# Patient Record
Sex: Female | Born: 1940
Health system: Southern US, Community
[De-identification: ages and names within clinical notes are randomized; demographics above are authoritative.]

## PROBLEM LIST (undated history)

## (undated) DIAGNOSIS — Z973 Presence of spectacles and contact lenses: Secondary | ICD-10-CM

## (undated) DIAGNOSIS — T8092XA Unspecified transfusion reaction, initial encounter: Secondary | ICD-10-CM

## (undated) DIAGNOSIS — R7989 Other specified abnormal findings of blood chemistry: Secondary | ICD-10-CM

## (undated) DIAGNOSIS — G43909 Migraine, unspecified, not intractable, without status migrainosus: Secondary | ICD-10-CM

## (undated) DIAGNOSIS — M1990 Unspecified osteoarthritis, unspecified site: Secondary | ICD-10-CM

## (undated) DIAGNOSIS — K635 Polyp of colon: Secondary | ICD-10-CM

## (undated) DIAGNOSIS — K760 Fatty (change of) liver, not elsewhere classified: Secondary | ICD-10-CM

## (undated) DIAGNOSIS — Z902 Acquired absence of lung [part of]: Secondary | ICD-10-CM

## (undated) DIAGNOSIS — D369 Benign neoplasm, unspecified site: Secondary | ICD-10-CM

## (undated) DIAGNOSIS — Z8669 Personal history of other diseases of the nervous system and sense organs: Secondary | ICD-10-CM

## (undated) DIAGNOSIS — L309 Dermatitis, unspecified: Secondary | ICD-10-CM

## (undated) DIAGNOSIS — R945 Abnormal results of liver function studies: Secondary | ICD-10-CM

## (undated) DIAGNOSIS — N329 Bladder disorder, unspecified: Secondary | ICD-10-CM

## (undated) DIAGNOSIS — L900 Lichen sclerosus et atrophicus: Secondary | ICD-10-CM

## (undated) DIAGNOSIS — K5730 Diverticulosis of large intestine without perforation or abscess without bleeding: Secondary | ICD-10-CM

## (undated) DIAGNOSIS — K219 Gastro-esophageal reflux disease without esophagitis: Secondary | ICD-10-CM

## (undated) DIAGNOSIS — D414 Neoplasm of uncertain behavior of bladder: Secondary | ICD-10-CM

## (undated) DIAGNOSIS — E785 Hyperlipidemia, unspecified: Secondary | ICD-10-CM

## (undated) DIAGNOSIS — I10 Essential (primary) hypertension: Secondary | ICD-10-CM

## (undated) DIAGNOSIS — N390 Urinary tract infection, site not specified: Secondary | ICD-10-CM

## (undated) DIAGNOSIS — M2040 Other hammer toe(s) (acquired), unspecified foot: Secondary | ICD-10-CM

## (undated) DIAGNOSIS — N289 Disorder of kidney and ureter, unspecified: Secondary | ICD-10-CM

## (undated) DIAGNOSIS — C801 Malignant (primary) neoplasm, unspecified: Secondary | ICD-10-CM

## (undated) DIAGNOSIS — Z5189 Encounter for other specified aftercare: Secondary | ICD-10-CM

## (undated) DIAGNOSIS — R0602 Shortness of breath: Secondary | ICD-10-CM

## (undated) HISTORY — DX: Lichen sclerosus et atrophicus: L90.0

## (undated) HISTORY — DX: Gastro-esophageal reflux disease without esophagitis: K21.9

## (undated) HISTORY — PX: VAGINAL HYSTERECTOMY: SUR661

## (undated) HISTORY — DX: Migraine, unspecified, not intractable, without status migrainosus: G43.909

## (undated) HISTORY — PX: OTHER SURGICAL HISTORY: SHX169

## (undated) HISTORY — DX: Unspecified transfusion reaction, initial encounter: T80.92XA

## (undated) HISTORY — DX: Disorder of kidney and ureter, unspecified: N28.9

## (undated) HISTORY — DX: Diverticulosis of large intestine without perforation or abscess without bleeding: K57.30

## (undated) HISTORY — DX: Neoplasm of uncertain behavior of bladder: D41.4

## (undated) HISTORY — PX: APPENDECTOMY: SHX54

## (undated) HISTORY — PX: BREAST BIOPSY: SHX20

## (undated) HISTORY — DX: Unspecified osteoarthritis, unspecified site: M19.90

## (undated) HISTORY — DX: Abnormal results of liver function studies: R94.5

## (undated) HISTORY — DX: Polyp of colon: K63.5

## (undated) HISTORY — DX: Benign neoplasm, unspecified site: D36.9

## (undated) HISTORY — DX: Other hammer toe(s) (acquired), unspecified foot: M20.40

## (undated) HISTORY — DX: Other specified abnormal findings of blood chemistry: R79.89

## (undated) HISTORY — DX: Fatty (change of) liver, not elsewhere classified: K76.0

## (undated) HISTORY — DX: Acquired absence of lung (part of): Z90.2

## (undated) HISTORY — DX: Dermatitis, unspecified: L30.9

---

## 1967-01-24 DIAGNOSIS — Z5189 Encounter for other specified aftercare: Secondary | ICD-10-CM

## 1967-01-24 DIAGNOSIS — IMO0001 Reserved for inherently not codable concepts without codable children: Secondary | ICD-10-CM

## 1967-01-24 HISTORY — PX: OTHER SURGICAL HISTORY: SHX169

## 1967-01-24 HISTORY — DX: Encounter for other specified aftercare: Z51.89

## 1967-01-24 HISTORY — DX: Reserved for inherently not codable concepts without codable children: IMO0001

## 1972-01-24 HISTORY — PX: VAGINAL HYSTERECTOMY: SUR661

## 1998-06-01 ENCOUNTER — Encounter: Payer: Self-pay | Admitting: Internal Medicine

## 1998-06-11 ENCOUNTER — Encounter: Payer: Self-pay | Admitting: Internal Medicine

## 1999-05-31 ENCOUNTER — Other Ambulatory Visit: Admission: RE | Admit: 1999-05-31 | Discharge: 1999-05-31 | Payer: Self-pay | Admitting: Obstetrics and Gynecology

## 1999-06-01 ENCOUNTER — Encounter: Admission: RE | Admit: 1999-06-01 | Discharge: 1999-06-01 | Payer: Self-pay | Admitting: Family Medicine

## 1999-06-01 ENCOUNTER — Encounter: Payer: Self-pay | Admitting: Family Medicine

## 2000-06-13 ENCOUNTER — Encounter: Admission: RE | Admit: 2000-06-13 | Discharge: 2000-09-11 | Payer: Self-pay | Admitting: Orthopedic Surgery

## 2000-10-10 ENCOUNTER — Encounter: Admission: RE | Admit: 2000-10-10 | Discharge: 2000-10-10 | Payer: Self-pay | Admitting: Obstetrics and Gynecology

## 2000-10-10 ENCOUNTER — Encounter: Payer: Self-pay | Admitting: Obstetrics and Gynecology

## 2000-12-25 ENCOUNTER — Other Ambulatory Visit: Admission: RE | Admit: 2000-12-25 | Discharge: 2000-12-25 | Payer: Self-pay | Admitting: Obstetrics and Gynecology

## 2001-11-20 ENCOUNTER — Encounter: Admission: RE | Admit: 2001-11-20 | Discharge: 2001-11-20 | Payer: Self-pay | Admitting: Urology

## 2001-11-20 ENCOUNTER — Encounter: Payer: Self-pay | Admitting: Urology

## 2001-11-20 ENCOUNTER — Encounter: Payer: Self-pay | Admitting: Internal Medicine

## 2001-11-21 ENCOUNTER — Encounter (INDEPENDENT_AMBULATORY_CARE_PROVIDER_SITE_OTHER): Payer: Self-pay | Admitting: *Deleted

## 2001-11-21 ENCOUNTER — Ambulatory Visit (HOSPITAL_BASED_OUTPATIENT_CLINIC_OR_DEPARTMENT_OTHER): Admission: RE | Admit: 2001-11-21 | Discharge: 2001-11-21 | Payer: Self-pay | Admitting: Urology

## 2002-03-25 ENCOUNTER — Encounter: Admission: RE | Admit: 2002-03-25 | Discharge: 2002-03-25 | Payer: Self-pay | Admitting: Obstetrics and Gynecology

## 2002-03-25 ENCOUNTER — Encounter: Payer: Self-pay | Admitting: Obstetrics and Gynecology

## 2002-06-09 ENCOUNTER — Other Ambulatory Visit: Admission: RE | Admit: 2002-06-09 | Discharge: 2002-06-09 | Payer: Self-pay | Admitting: Obstetrics and Gynecology

## 2002-09-09 ENCOUNTER — Encounter: Admission: RE | Admit: 2002-09-09 | Discharge: 2002-12-03 | Payer: Self-pay | Admitting: Orthopedic Surgery

## 2003-08-10 ENCOUNTER — Other Ambulatory Visit: Admission: RE | Admit: 2003-08-10 | Discharge: 2003-08-10 | Payer: Self-pay | Admitting: Obstetrics and Gynecology

## 2003-12-25 ENCOUNTER — Encounter: Admission: RE | Admit: 2003-12-25 | Discharge: 2003-12-25 | Payer: Self-pay | Admitting: Obstetrics and Gynecology

## 2004-01-19 ENCOUNTER — Encounter: Admission: RE | Admit: 2004-01-19 | Discharge: 2004-03-02 | Payer: Self-pay | Admitting: Orthopedic Surgery

## 2004-07-29 ENCOUNTER — Ambulatory Visit: Payer: Self-pay | Admitting: Internal Medicine

## 2004-08-05 ENCOUNTER — Ambulatory Visit: Payer: Self-pay | Admitting: Internal Medicine

## 2004-08-30 ENCOUNTER — Other Ambulatory Visit: Admission: RE | Admit: 2004-08-30 | Discharge: 2004-08-30 | Payer: Self-pay | Admitting: Obstetrics and Gynecology

## 2004-12-20 ENCOUNTER — Ambulatory Visit: Payer: Self-pay | Admitting: Internal Medicine

## 2005-02-14 ENCOUNTER — Encounter: Admission: RE | Admit: 2005-02-14 | Discharge: 2005-02-14 | Payer: Self-pay | Admitting: Obstetrics and Gynecology

## 2006-02-27 ENCOUNTER — Ambulatory Visit: Payer: Self-pay | Admitting: Internal Medicine

## 2006-02-27 LAB — CONVERTED CEMR LAB
Basophils Absolute: 0.1 10*3/uL (ref 0.0–0.1)
Basophils Relative: 0.8 % (ref 0.0–1.0)
CO2: 32 meq/L (ref 19–32)
Calcium: 9.8 mg/dL (ref 8.4–10.5)
Chloride: 105 meq/L (ref 96–112)
Cholesterol: 171 mg/dL (ref 0–200)
Creatinine, Ser: 0.6 mg/dL (ref 0.4–1.2)
GFR calc Af Amer: 129 mL/min
Glucose, Bld: 103 mg/dL — ABNORMAL HIGH (ref 70–99)
HDL: 34 mg/dL — ABNORMAL LOW (ref >39.0)
LDL Cholesterol: 101 mg/dL — ABNORMAL HIGH (ref 0–99)
MCHC: 35.2 g/dL (ref 30.0–36.0)
Monocytes Relative: 7.7 % (ref 3.0–11.0)
Neutro Abs: 4.9 10*3/uL (ref 1.4–7.7)
Platelets: 305 10*3/uL (ref 150–400)
Potassium: 4.1 meq/L (ref 3.5–5.1)
RBC: 4.53 M/uL (ref 3.87–5.11)
RDW: 11.2 % — ABNORMAL LOW (ref 11.5–14.6)
TSH: 1.06 microintl units/mL (ref 0.35–5.50)
Total Bilirubin: 0.5 mg/dL (ref 0.3–1.2)
Total CHOL/HDL Ratio: 5
Total Protein: 7 g/dL (ref 6.0–8.3)
Triglycerides: 179 mg/dL — ABNORMAL HIGH (ref 0–149)

## 2006-03-16 ENCOUNTER — Ambulatory Visit: Payer: Self-pay | Admitting: Internal Medicine

## 2006-03-19 ENCOUNTER — Encounter: Admission: RE | Admit: 2006-03-19 | Discharge: 2006-03-19 | Payer: Self-pay | Admitting: Obstetrics and Gynecology

## 2006-03-28 ENCOUNTER — Ambulatory Visit: Payer: Self-pay | Admitting: Internal Medicine

## 2006-03-28 ENCOUNTER — Encounter (INDEPENDENT_AMBULATORY_CARE_PROVIDER_SITE_OTHER): Payer: Self-pay | Admitting: *Deleted

## 2006-04-24 ENCOUNTER — Ambulatory Visit: Payer: Self-pay | Admitting: Internal Medicine

## 2006-05-10 ENCOUNTER — Ambulatory Visit: Payer: Self-pay | Admitting: Internal Medicine

## 2006-09-13 ENCOUNTER — Telehealth: Payer: Self-pay | Admitting: Internal Medicine

## 2006-10-18 ENCOUNTER — Encounter: Admission: RE | Admit: 2006-10-18 | Discharge: 2007-01-08 | Payer: Self-pay | Admitting: Neurosurgery

## 2006-11-19 DIAGNOSIS — M1990 Unspecified osteoarthritis, unspecified site: Secondary | ICD-10-CM | POA: Insufficient documentation

## 2006-11-19 DIAGNOSIS — R32 Unspecified urinary incontinence: Secondary | ICD-10-CM | POA: Insufficient documentation

## 2007-01-24 DIAGNOSIS — K635 Polyp of colon: Secondary | ICD-10-CM

## 2007-01-24 HISTORY — DX: Polyp of colon: K63.5

## 2007-03-21 ENCOUNTER — Encounter: Admission: RE | Admit: 2007-03-21 | Discharge: 2007-03-21 | Payer: Self-pay | Admitting: Obstetrics and Gynecology

## 2007-03-26 ENCOUNTER — Encounter: Admission: RE | Admit: 2007-03-26 | Discharge: 2007-03-26 | Payer: Self-pay | Admitting: Obstetrics and Gynecology

## 2007-04-16 ENCOUNTER — Telehealth: Payer: Self-pay | Admitting: Internal Medicine

## 2007-04-29 ENCOUNTER — Ambulatory Visit: Payer: Self-pay | Admitting: Internal Medicine

## 2007-04-29 DIAGNOSIS — I10 Essential (primary) hypertension: Secondary | ICD-10-CM | POA: Insufficient documentation

## 2007-04-29 DIAGNOSIS — E785 Hyperlipidemia, unspecified: Secondary | ICD-10-CM | POA: Insufficient documentation

## 2007-04-29 DIAGNOSIS — IMO0002 Reserved for concepts with insufficient information to code with codable children: Secondary | ICD-10-CM | POA: Insufficient documentation

## 2007-04-29 LAB — CONVERTED CEMR LAB
Protein, U semiquant: NEGATIVE
Urobilinogen, UA: 0.2
Vit D, 1,25-Dihydroxy: 31 (ref 30–89)
WBC Urine, dipstick: NEGATIVE
pH: 7

## 2007-05-06 ENCOUNTER — Telehealth: Payer: Self-pay | Admitting: *Deleted

## 2007-05-06 LAB — CONVERTED CEMR LAB
Albumin: 4.1 g/dL (ref 3.5–5.2)
Alkaline Phosphatase: 44 units/L (ref 39–117)
BUN: 10 mg/dL (ref 6–23)
Basophils Relative: 0.9 % (ref 0.0–1.0)
Calcium: 9 mg/dL (ref 8.4–10.5)
Chloride: 99 meq/L (ref 96–112)
Creatinine, Ser: 0.5 mg/dL (ref 0.4–1.2)
Eosinophils Absolute: 0.2 10*3/uL (ref 0.0–0.7)
GFR calc Af Amer: 159 mL/min
Glucose, Bld: 90 mg/dL (ref 70–99)
HDL: 29 mg/dL — ABNORMAL LOW (ref >39.0)
Hemoglobin: 14.3 g/dL (ref 12.0–15.0)
MCHC: 33.1 g/dL (ref 30.0–36.0)
MCV: 90 fL (ref 78.0–100.0)
Neutro Abs: 6 10*3/uL (ref 1.4–7.7)
Neutrophils Relative %: 60 % (ref 43.0–77.0)
Potassium: 3.7 meq/L (ref 3.5–5.1)
RDW: 11 % — ABNORMAL LOW (ref 11.5–14.6)
Sodium: 134 meq/L — ABNORMAL LOW (ref 135–145)
TSH: 0.83 microintl units/mL (ref 0.35–5.50)
VLDL: 25 mg/dL (ref 0–40)
WBC: 10.1 10*3/uL (ref 4.5–10.5)

## 2007-05-29 ENCOUNTER — Ambulatory Visit: Payer: Self-pay | Admitting: Internal Medicine

## 2007-05-29 LAB — CONVERTED CEMR LAB
ALT: 38 units/L — ABNORMAL HIGH (ref 0–35)
AST: 38 units/L — ABNORMAL HIGH (ref 0–37)
Alkaline Phosphatase: 39 units/L (ref 39–117)
Bilirubin, Direct: 0.1 mg/dL (ref 0.0–0.3)
HCV Ab: NEGATIVE

## 2007-06-04 ENCOUNTER — Telehealth: Payer: Self-pay | Admitting: Internal Medicine

## 2007-10-16 ENCOUNTER — Ambulatory Visit: Payer: Self-pay | Admitting: Internal Medicine

## 2007-10-16 DIAGNOSIS — L259 Unspecified contact dermatitis, unspecified cause: Secondary | ICD-10-CM | POA: Insufficient documentation

## 2007-10-16 DIAGNOSIS — R5383 Other fatigue: Secondary | ICD-10-CM

## 2007-10-16 DIAGNOSIS — R5381 Other malaise: Secondary | ICD-10-CM

## 2007-10-21 LAB — CONVERTED CEMR LAB
ALT: 36 units/L — ABNORMAL HIGH (ref 0–35)
AST: 41 units/L — ABNORMAL HIGH (ref 0–37)
Alkaline Phosphatase: 49 units/L (ref 39–117)
Basophils Absolute: 0 10*3/uL (ref 0.0–0.1)
Eosinophils Absolute: 0.2 10*3/uL (ref 0.0–0.7)
HCT: 40 % (ref 36.0–46.0)
Hemoglobin: 14.2 g/dL (ref 12.0–15.0)
MCHC: 35.4 g/dL (ref 30.0–36.0)
MCV: 89 fL (ref 78.0–100.0)
Monocytes Absolute: 0.3 10*3/uL (ref 0.1–1.0)
Monocytes Relative: 3 % (ref 3.0–12.0)
Neutro Abs: 6.7 10*3/uL (ref 1.4–7.7)
Platelets: 287 10*3/uL (ref 150–400)
RDW: 11.4 % — ABNORMAL LOW (ref 11.5–14.6)
WBC: 10.6 10*3/uL — ABNORMAL HIGH (ref 4.5–10.5)

## 2007-10-22 ENCOUNTER — Encounter: Admission: RE | Admit: 2007-10-22 | Discharge: 2007-10-22 | Payer: Self-pay | Admitting: Internal Medicine

## 2007-11-19 ENCOUNTER — Telehealth: Payer: Self-pay | Admitting: Internal Medicine

## 2007-11-20 ENCOUNTER — Ambulatory Visit: Payer: Self-pay | Admitting: Internal Medicine

## 2007-11-20 DIAGNOSIS — J029 Acute pharyngitis, unspecified: Secondary | ICD-10-CM

## 2007-11-20 DIAGNOSIS — Z87448 Personal history of other diseases of urinary system: Secondary | ICD-10-CM

## 2007-11-20 DIAGNOSIS — R945 Abnormal results of liver function studies: Secondary | ICD-10-CM | POA: Insufficient documentation

## 2007-11-20 LAB — CONVERTED CEMR LAB: Rapid Strep: NEGATIVE

## 2007-11-22 ENCOUNTER — Encounter: Payer: Self-pay | Admitting: Internal Medicine

## 2007-11-22 LAB — CONVERTED CEMR LAB
Albumin: 4.1 g/dL (ref 3.5–5.2)
Alkaline Phosphatase: 59 units/L (ref 39–117)
Chloride: 100 meq/L (ref 96–112)
Creatinine, Ser: 0.6 mg/dL (ref 0.4–1.2)
GFR calc Af Amer: 128 mL/min
GFR calc non Af Amer: 106 mL/min
Glucose, Bld: 91 mg/dL (ref 70–99)
Total Protein: 7.4 g/dL (ref 6.0–8.3)

## 2007-11-23 ENCOUNTER — Encounter: Admission: RE | Admit: 2007-11-23 | Discharge: 2007-11-23 | Payer: Self-pay | Admitting: Internal Medicine

## 2008-04-19 ENCOUNTER — Emergency Department (HOSPITAL_COMMUNITY): Admission: EM | Admit: 2008-04-19 | Discharge: 2008-04-19 | Payer: Self-pay | Admitting: Family Medicine

## 2008-04-20 ENCOUNTER — Ambulatory Visit: Payer: Self-pay | Admitting: Internal Medicine

## 2008-04-20 DIAGNOSIS — R070 Pain in throat: Secondary | ICD-10-CM

## 2008-04-20 DIAGNOSIS — M542 Cervicalgia: Secondary | ICD-10-CM

## 2008-04-21 ENCOUNTER — Encounter: Payer: Self-pay | Admitting: Internal Medicine

## 2008-05-25 ENCOUNTER — Telehealth: Payer: Self-pay | Admitting: Internal Medicine

## 2008-07-02 ENCOUNTER — Encounter: Admission: RE | Admit: 2008-07-02 | Discharge: 2008-07-02 | Payer: Self-pay | Admitting: Internal Medicine

## 2008-10-19 ENCOUNTER — Encounter: Payer: Self-pay | Admitting: Internal Medicine

## 2008-10-22 ENCOUNTER — Encounter: Payer: Self-pay | Admitting: *Deleted

## 2008-10-26 ENCOUNTER — Ambulatory Visit: Payer: Self-pay | Admitting: Internal Medicine

## 2008-10-26 DIAGNOSIS — M79609 Pain in unspecified limb: Secondary | ICD-10-CM

## 2008-10-26 DIAGNOSIS — K7689 Other specified diseases of liver: Secondary | ICD-10-CM | POA: Insufficient documentation

## 2008-10-26 DIAGNOSIS — R05 Cough: Secondary | ICD-10-CM

## 2008-10-26 DIAGNOSIS — D1803 Hemangioma of intra-abdominal structures: Secondary | ICD-10-CM | POA: Insufficient documentation

## 2008-10-28 LAB — CONVERTED CEMR LAB
AST: 53 units/L — ABNORMAL HIGH (ref 0–37)
Alkaline Phosphatase: 65 units/L (ref 39–117)
BUN: 11 mg/dL (ref 6–23)
Basophils Absolute: 0.1 10*3/uL (ref 0.0–0.1)
CO2: 32 meq/L (ref 19–32)
Calcium: 9.7 mg/dL (ref 8.4–10.5)
Chloride: 106 meq/L (ref 96–112)
Cholesterol: 248 mg/dL — ABNORMAL HIGH (ref 0–200)
Direct LDL: 201.9 mg/dL
HCT: 41.5 % (ref 36.0–46.0)
Hemoglobin: 14.4 g/dL (ref 12.0–15.0)
Lymphocytes Relative: 33.4 % (ref 12.0–46.0)
MCV: 88 fL (ref 78.0–100.0)
Platelets: 260 10*3/uL (ref 150.0–400.0)
Potassium: 4 meq/L (ref 3.5–5.1)
Sodium: 142 meq/L (ref 135–145)
TSH: 0.98 microintl units/mL (ref 0.35–5.50)
Total Protein: 6.8 g/dL (ref 6.0–8.3)
Triglycerides: 181 mg/dL — ABNORMAL HIGH (ref 0.0–149.0)

## 2008-10-29 ENCOUNTER — Telehealth: Payer: Self-pay | Admitting: *Deleted

## 2008-11-02 ENCOUNTER — Encounter: Payer: Self-pay | Admitting: Internal Medicine

## 2008-12-14 ENCOUNTER — Ambulatory Visit: Payer: Self-pay | Admitting: Internal Medicine

## 2008-12-14 LAB — CONVERTED CEMR LAB
ALT: 53 units/L — ABNORMAL HIGH (ref 0–35)
AST: 38 units/L — ABNORMAL HIGH (ref 0–37)
Albumin: 4.1 g/dL (ref 3.5–5.2)
Alkaline Phosphatase: 63 units/L (ref 39–117)
Bilirubin, Direct: 0.1 mg/dL (ref 0.0–0.3)
Ferritin: 88.1 ng/mL (ref 10.0–291.0)

## 2008-12-28 ENCOUNTER — Ambulatory Visit: Payer: Self-pay | Admitting: Internal Medicine

## 2008-12-28 LAB — CONVERTED CEMR LAB
ALT: 43 units/L — ABNORMAL HIGH (ref 0–35)
AST: 34 units/L (ref 0–37)
Albumin: 3.9 g/dL (ref 3.5–5.2)
Alkaline Phosphatase: 58 units/L (ref 39–117)
Total Protein: 6.5 g/dL (ref 6.0–8.3)

## 2009-01-04 ENCOUNTER — Ambulatory Visit: Payer: Self-pay | Admitting: Internal Medicine

## 2009-01-04 DIAGNOSIS — N390 Urinary tract infection, site not specified: Secondary | ICD-10-CM

## 2009-01-04 DIAGNOSIS — R7309 Other abnormal glucose: Secondary | ICD-10-CM | POA: Insufficient documentation

## 2009-01-04 LAB — CONVERTED CEMR LAB
Nitrite: NEGATIVE
Protein, U semiquant: NEGATIVE

## 2009-01-23 HISTORY — PX: PERCUTANEOUS LIVER BIOPSY: SUR136

## 2009-01-23 HISTORY — PX: OTHER SURGICAL HISTORY: SHX169

## 2009-01-29 ENCOUNTER — Ambulatory Visit (HOSPITAL_COMMUNITY): Admission: RE | Admit: 2009-01-29 | Discharge: 2009-01-29 | Payer: Self-pay | Admitting: Internal Medicine

## 2009-01-29 ENCOUNTER — Encounter: Payer: Self-pay | Admitting: Internal Medicine

## 2009-02-04 ENCOUNTER — Telehealth: Payer: Self-pay | Admitting: Internal Medicine

## 2009-03-23 ENCOUNTER — Encounter (INDEPENDENT_AMBULATORY_CARE_PROVIDER_SITE_OTHER): Payer: Self-pay | Admitting: *Deleted

## 2009-03-26 ENCOUNTER — Ambulatory Visit: Payer: Self-pay | Admitting: Internal Medicine

## 2009-03-26 DIAGNOSIS — L608 Other nail disorders: Secondary | ICD-10-CM

## 2009-06-11 ENCOUNTER — Encounter (INDEPENDENT_AMBULATORY_CARE_PROVIDER_SITE_OTHER): Payer: Self-pay | Admitting: *Deleted

## 2009-06-24 ENCOUNTER — Encounter (INDEPENDENT_AMBULATORY_CARE_PROVIDER_SITE_OTHER): Payer: Self-pay | Admitting: *Deleted

## 2009-06-28 ENCOUNTER — Ambulatory Visit: Payer: Self-pay | Admitting: Internal Medicine

## 2009-07-05 ENCOUNTER — Encounter: Admission: RE | Admit: 2009-07-05 | Discharge: 2009-07-05 | Payer: Self-pay | Admitting: Internal Medicine

## 2009-07-08 ENCOUNTER — Ambulatory Visit: Payer: Self-pay | Admitting: Internal Medicine

## 2009-08-02 ENCOUNTER — Encounter: Admission: RE | Admit: 2009-08-02 | Discharge: 2009-08-24 | Payer: Self-pay | Admitting: Orthopedic Surgery

## 2009-10-27 ENCOUNTER — Encounter: Payer: Self-pay | Admitting: Internal Medicine

## 2009-10-29 ENCOUNTER — Ambulatory Visit: Payer: Self-pay | Admitting: Internal Medicine

## 2009-10-29 LAB — CONVERTED CEMR LAB
Bilirubin Urine: NEGATIVE
Ketones, urine, test strip: NEGATIVE
Nitrite: NEGATIVE
Urobilinogen, UA: 0.2
pH: 5.5

## 2009-10-30 DIAGNOSIS — K5730 Diverticulosis of large intestine without perforation or abscess without bleeding: Secondary | ICD-10-CM | POA: Insufficient documentation

## 2009-11-01 LAB — CONVERTED CEMR LAB
Alkaline Phosphatase: 64 units/L (ref 39–117)
Basophils Absolute: 0.1 10*3/uL (ref 0.0–0.1)
Basophils Relative: 0.8 % (ref 0.0–3.0)
Bilirubin, Direct: 0.1 mg/dL (ref 0.0–0.3)
CO2: 31 meq/L (ref 19–32)
Folate: 14 ng/mL
Glucose, Bld: 89 mg/dL (ref 70–99)
HCT: 41 % (ref 36.0–46.0)
Hemoglobin: 14.1 g/dL (ref 12.0–15.0)
LDL Cholesterol: 95 mg/dL (ref 0–99)
MCHC: 34.5 g/dL (ref 30.0–36.0)
Magnesium: 2.2 mg/dL (ref 1.5–2.5)
Neutrophils Relative %: 56 % (ref 43.0–77.0)
Platelets: 268 10*3/uL (ref 150.0–400.0)
RDW: 12 % (ref 11.5–14.6)
Sodium: 140 meq/L (ref 135–145)
Total CHOL/HDL Ratio: 5
Total Protein: 6.7 g/dL (ref 6.0–8.3)
Vitamin B-12: 440 pg/mL (ref 211–911)

## 2009-12-10 ENCOUNTER — Telehealth: Payer: Self-pay | Admitting: *Deleted

## 2010-01-23 HISTORY — PX: KNEE ARTHROSCOPY: SUR90

## 2010-01-23 HISTORY — PX: OTHER SURGICAL HISTORY: SHX169

## 2010-01-27 ENCOUNTER — Other Ambulatory Visit: Payer: Self-pay | Admitting: Physician Assistant

## 2010-01-27 ENCOUNTER — Ambulatory Visit
Admission: RE | Admit: 2010-01-27 | Discharge: 2010-01-27 | Payer: Self-pay | Source: Home / Self Care | Attending: Gastroenterology | Admitting: Gastroenterology

## 2010-01-27 ENCOUNTER — Telehealth: Payer: Self-pay | Admitting: Internal Medicine

## 2010-01-27 DIAGNOSIS — J45909 Unspecified asthma, uncomplicated: Secondary | ICD-10-CM | POA: Insufficient documentation

## 2010-01-27 DIAGNOSIS — K5732 Diverticulitis of large intestine without perforation or abscess without bleeding: Secondary | ICD-10-CM | POA: Insufficient documentation

## 2010-01-27 DIAGNOSIS — Z860100 Personal history of colon polyps, unspecified: Secondary | ICD-10-CM | POA: Insufficient documentation

## 2010-01-27 DIAGNOSIS — R1032 Left lower quadrant pain: Secondary | ICD-10-CM | POA: Insufficient documentation

## 2010-01-27 LAB — CBC WITH DIFFERENTIAL/PLATELET
Basophils Absolute: 0.1 10*3/uL (ref 0.0–0.1)
Basophils Relative: 0.5 % (ref 0.0–3.0)
Eosinophils Absolute: 0.2 10*3/uL (ref 0.0–0.7)
Eosinophils Relative: 1.3 % (ref 0.0–5.0)
HCT: 40.5 % (ref 36.0–46.0)
Hemoglobin: 14.2 g/dL (ref 12.0–15.0)
Lymphocytes Relative: 24.9 % (ref 12.0–46.0)
Lymphs Abs: 3.2 10*3/uL (ref 0.7–4.0)
MCHC: 35.1 g/dL (ref 30.0–36.0)
MCV: 87.2 fl (ref 78.0–100.0)
Monocytes Absolute: 1 10*3/uL (ref 0.1–1.0)
Monocytes Relative: 7.7 % (ref 3.0–12.0)
Neutro Abs: 8.4 10*3/uL — ABNORMAL HIGH (ref 1.4–7.7)
Neutrophils Relative %: 65.6 % (ref 43.0–77.0)
Platelets: 268 10*3/uL (ref 150.0–400.0)
RBC: 4.65 Mil/uL (ref 3.87–5.11)
RDW: 11.7 % (ref 11.5–14.6)
WBC: 12.8 10*3/uL — ABNORMAL HIGH (ref 4.5–10.5)

## 2010-02-13 ENCOUNTER — Encounter: Payer: Self-pay | Admitting: Obstetrics and Gynecology

## 2010-02-13 ENCOUNTER — Encounter: Payer: Self-pay | Admitting: Internal Medicine

## 2010-02-22 NOTE — Progress Notes (Signed)
Summary: refill on nexium  Phone Note From Pharmacy   Caller: [facility]  [address] [phone]* Reason for Call: Needs renewal Summary of Call: nexium Initial call taken by: Romualdo Bolk, CMA Duncan Dull),  December 10, 2009 9:27 AM  Follow-up for Phone Call        Rx sent to pharmacy. Follow-up by: Romualdo Bolk, CMA Duncan Dull),  December 10, 2009 9:37 AM    Prescriptions: NEXIUM 40 MG CPDR (ESOMEPRAZOLE MAGNESIUM) 1 by mouth once daily  #90 Not Speci x 1   Entered by:   Romualdo Bolk, CMA (AAMA)   Authorized by:   Madelin Headings MD   Signed by:   Romualdo Bolk, CMA (AAMA) on 12/10/2009   Method used:   Electronically to        [facility] [phone]* (retail)       [address]       [place], [place]  [address]       Ph: [phone]       Fax: [phone]   RxID:   [id number]

## 2010-02-22 NOTE — Procedures (Signed)
Summary: Colonoscopy  Patient: [name] Note: All result statuses are Final unless otherwise noted.  Tests: (1) Colonoscopy (COL)   COL Colonoscopy           DONE     [facility]     [address]     [place], [place]  [address]           COLONOSCOPY PROCEDURE REPORT           PATIENT:  Madison Erickson, Madison Erickson  MR#:  [id number]     BIRTHDATE:  07-Sep-1940, 68 yrs. old  GENDER:  female     ENDOSCOPIST:  Iva Boop, MD, [facility]     REF. BY:     PROCEDURE DATE:  07/08/2009     PROCEDURE:  Higher-risk screening colonoscopy G0105           ASA CLASS:  Class II     INDICATIONS:  surveillance and high-risk screening, history of     pre-cancerous (adenomatous) colon polyps 1.8 cm tubulovillous     adenoma removed in 03/2006     MEDICATIONS:   Fentanyl 75 mcg, Versed 8 mg           DESCRIPTION OF PROCEDURE:   After the risks benefits and     alternatives of the procedure were thoroughly explained, informed     consent was obtained.  Digital rectal exam was performed and     revealed no abnormalities.   The LB [id number] endoscope     was introduced through the anus and advanced to the cecum, which     was identified by both the appendix and ileocecal valve, without     limitations.  The quality of the prep was excellent, using     MoviPrep.  The instrument was then slowly withdrawn as the colon     was fully examined. Insertion: 2:11 minutes Withdrawal: 7:16     minutes     <<PROCEDUREIMAGES>>           FINDINGS:  Severe diverticulosis was found in the sigmoid colon.     This was otherwise a normal examination of the colon.   Retroflexed     views in the rectum revealed internal hemorrhoids.    The scope     was then withdrawn from the patient and the procedure completed.           COMPLICATIONS:  None     ENDOSCOPIC IMPRESSION:     1) Severe diverticulosis in the sigmoid colon     2) Otherwise normal examination of the colon,excellent prep     3) Internal hemorrhoids  4) Personal history of tubulovillous adenoma removal 03/2006           REPEAT EXAM:  In 5 year(s) for routine screening colonoscopy.           Iva Boop, MD, [facility]           CC:  The Patient           n.     eSIGNED:   Iva Boop at 07/08/2009 10:56 AM           Clare Charon, [id number]  Note: An exclamation mark (!) indicates a result that was not dispersed into the flowsheet. Document Creation Date: 07/08/2009 10:58 AM _______________________________________________________________________  (1) Order result status: Final Collection or observation date-time: 07/08/2009 10:50 Requested date-time:  Receipt date-time:  Reported date-time:  Referring Physician:   Ordering Physician: Stan Head (  [id number]) Specimen Source:  Source: Launa Grill Order Number: [id number] Lab site:   Appended Document: Colonoscopy    Clinical Lists Changes  Observations: Added new observation of COLONNXTDUE: 06/2014 (07/08/2009 11:10)

## 2010-02-22 NOTE — Progress Notes (Signed)
Summary: biopsy results request  b Phone Note Call from Patient Call back at [phone]   Caller: Patient Call For: Dr. Leone Payor Reason for Call: Lab or Test Results Summary of Call: would like results from liver biopsy Initial call taken by: Vallarie Mare,  February 04, 2009 10:21 AM  Follow-up for Phone Call        Dr Leone Payor patient is calling for results.  Please advise what I should tell her.   Follow-up by: Darcey Nora RN, CGRN,  February 04, 2009 11:26 AM  Additional Follow-up for Phone Call Additional follow up Details #1::        i called but she was not there. I left message that you would call her tomorrow. She has mild fatty liver with inflammation (hepatitis). Dr. Fabian Sharp is doing the right thing by treating the abnormal lipids and weight loss is helpful for this. There are no proven therapies for this, key is to try to contol weight. Odds are it will not cause her problems though there is a small chance she could get cirrhosis.  Alcohol is not good for this but if she is only having a drink 4-5x/yr that should not be an issue. She can be seen in iffice to review further, at her convenience. She can gollow with Dr. Fabian Sharp and have routine LFT checks while on Crestor. Please cc Dr. Fabian Sharp when complete. Additional Follow-up by: Iva Boop MD, [facility],  February 04, 2009 4:55 PM    Additional Follow-up for Phone Call Additional follow up Details #2::    Left message for patient to call back Darcey Nora RN, [facility]  February 05, 2009 8:55 AM  Reviewed all the above with the patient .  She has requested I mail her copies of her reports.  She will follow up with Dr Fabian Sharp and return to Dr Leone Payor as needed.  Follow-up by: Darcey Nora RN, CGRN,  February 05, 2009 9:31 AM

## 2010-02-22 NOTE — Miscellaneous (Signed)
Summary: recall colon--ch.  Clinical Lists Changes  Medications: Added new medication of MOVIPREP 100 GM  SOLR (PEG-KCL-NACL-NASULF-NA ASC-C) As directed - Signed Rx of MOVIPREP 100 GM  SOLR (PEG-KCL-NACL-NASULF-NA ASC-C) As directed;  #1 x 0;  Signed;  Entered by: Clide Cliff RN;  Authorized by: Iva Boop MD, [facility];  Method used: Electronically to [facility] #[id number]*, [address], [place], [place], [place]  [address], Ph: [phone], Fax: [phone] Observations: Added new observation of ALLERGY REV: Done (06/28/2009 10:53)    Prescriptions: MOVIPREP 100 GM  SOLR (PEG-KCL-NACL-NASULF-NA ASC-C) As directed  #1 x 0   Entered by:   Clide Cliff RN   Authorized by:   Iva Boop MD, [facility]   Signed by:   Clide Cliff RN on 06/28/2009   Method used:   Electronically to        [facility] [phone]* (retail)       [address]       [place], [place]  [address]       Ph: [phone]       Fax: [phone]   RxID:   [id number]

## 2010-02-22 NOTE — Miscellaneous (Signed)
Summary: Flu Shot/[facility]  Flu Shot/[facility]   Imported By: Maryln Gottron 11/03/2009 11:13:34  _____________________________________________________________________  External Attachment:    Type:   Image     Comment:   External Document

## 2010-02-22 NOTE — Letter (Signed)
Summary: Previsit letter  [facility]  [address] [place], [place] [address]   Phone: [phone]  Fax: [phone]       06/11/2009 MRN: [id number]  [facility] [place] [address] [place], [place]  [address]  Dear Ms. Opheim,  Welcome to the Gastroenterology Division at [facility].    You are scheduled to see a nurse for your pre-procedure visit on 06-24-09 at 1:30p.m. on the 3rd floor at [facility], [address]  We ask that you try to arrive at our office 15 minutes prior to your appointment time to allow for check-in.  Your nurse visit will consist of discussing your medical and surgical history, your immediate family medical history, and your medications.    Please bring a complete list of all your medications or, if you prefer, bring the medication bottles and we will list them.  We will need to be aware of both prescribed and over the counter drugs.  We will need to know exact dosage information as well.  If you are on blood thinners (Coumadin, Plavix, Aggrenox, Ticlid, etc.) please call our office today/prior to your appointment, as we need to consult with your physician about holding your medication.   Please be prepared to read and sign documents such as consent forms, a financial agreement, and acknowledgement forms.  If necessary, and with your consent, a friend or relative is welcome to sit-in on the nurse visit with you.  Please bring your insurance card so that we may make a copy of it.  If your insurance requires a referral to see a specialist, please bring your referral form from your primary care physician.  No co-pay is required for this nurse visit.     If you cannot keep your appointment, please call [phone] to cancel or reschedule prior to your appointment date.  This allows [place] the opportunity to schedule an appointment for another patient in need of care.    Thank you for choosing [facility] for your medical needs.  We  appreciate the opportunity to care for you.  Please visit [place] at our website  to learn more about our practice.                     Sincerely.                                                                                                                   The Gastroenterology Division

## 2010-02-22 NOTE — Letter (Signed)
Summary: Colonoscopy Letter  [facility]  [address] [place], [place] [address]   Phone: [phone]  Fax: [phone]      March 23, 2009 MRN: [id number]   [facility] [address] [place], [place]  [address]   Dear Ms. Mitchener,   According to your medical record, it is time for you to schedule a Colonoscopy. The American Cancer Society recommends this procedure as a method to detect early colon cancer. Patients with a family history of colon cancer, or a personal history of colon polyps or inflammatory bowel disease are at increased risk.  This letter has beeen generated based on the recommendations made at the time of your procedure. If you feel that in your particular situation this may no longer apply, please contact our office.  Please call our office at [phone] to schedule this appointment or to update your records at your earliest convenience.  Thank you for cooperating with [place] to provide you with the very best care possible.   Sincerely,  Iva Boop, M.D.  [facility] Gastroenterology Division [phone]

## 2010-02-22 NOTE — Letter (Signed)
Summary: [facility] Instructions  [facility]  [address] [place], [place] [address]   Phone: [phone]  Fax: [phone]       Madison Erickson    1968/06/18    MRN: [id number]        Procedure Day Dorna Bloom:  Lenor Coffin  07/08/09     Arrival Time:  9:00AM     Procedure Time:  10:00AM     Location of Procedure:                    _X _  [facility] (4th Floor)                        PREPARATION FOR COLONOSCOPY WITH MOVIPREP   Starting 5 days prior to your procedure 07/03/09 do not eat nuts, seeds, popcorn, corn, beans, peas,  salads, or any raw vegetables.  Do not take any fiber supplements (e.g. Metamucil, Citrucel, and Benefiber).  THE DAY BEFORE YOUR PROCEDURE         DATE: 07/07/09  DAY: WEDNESDAY  1.  Drink clear liquids the entire day-NO SOLID FOOD  2.  Do not drink anything colored red or purple.  Avoid juices with pulp.  No orange juice.  3.  Drink at least 64 oz. (8 glasses) of fluid/clear liquids during the day to prevent dehydration and help the prep work efficiently.  CLEAR LIQUIDS INCLUDE: Water Jello Ice Popsicles Tea (sugar ok, no milk/cream) Powdered fruit flavored drinks Coffee (sugar ok, no milk/cream) Gatorade Juice: apple, white grape, white cranberry  Lemonade Clear bullion, consomm, broth Carbonated beverages (any kind) Strained chicken noodle soup Hard Candy                             4.  In the morning, mix first dose of MoviPrep solution:    Empty 1 Pouch A and 1 Pouch B into the disposable container    Add lukewarm drinking water to the top line of the container. Mix to dissolve    Refrigerate (mixed solution should be used within 24 hrs)  5.  Begin drinking the prep at 5:00 p.m. The MoviPrep container is divided by 4 marks.   Every 15 minutes drink the solution down to the next mark (approximately 8 oz) until the full liter is complete.   6.  Follow completed prep with 16 oz of clear liquid of your choice  (Nothing red or purple).  Continue to drink clear liquids until bedtime.  7.  Before going to bed, mix second dose of MoviPrep solution:    Empty 1 Pouch A and 1 Pouch B into the disposable container    Add lukewarm drinking water to the top line of the container. Mix to dissolve    Refrigerate  THE DAY OF YOUR PROCEDURE      DATE: 07/08/09  DAY: THURSDAY  Beginning at 07/03/09 (5 hours before procedure):         1. Every 15 minutes, drink the solution down to the next mark (approx 8 oz) until the full liter is complete.  2. Follow completed prep with 16 oz. of clear liquid of your choice.    3. You may drink clear liquids until 8:00AM (2 HOURS BEFORE PROCEDURE).   MEDICATION INSTRUCTIONS  Unless otherwise instructed, you should take regular prescription medications with a small sip of water   as early as possible the morning  of your procedure.         OTHER INSTRUCTIONS  You will need a responsible adult at least 70 years of age to accompany you and drive you home.   This person must remain in the waiting room during your procedure.  Wear loose fitting clothing that is easily removed.  Leave jewelry and other valuables at home.  However, you may wish to bring a book to read or  an iPod/MP3 player to listen to music as you wait for your procedure to start.  Remove all body piercing jewelry and leave at home.  Total time from sign-in until discharge is approximately 2-3 hours.  You should go home directly after your procedure and rest.  You can resume normal activities the  day after your procedure.  The day of your procedure you should not:   Drive   Make legal decisions   Operate machinery   Drink alcohol   Return to work  You will receive specific instructions about eating, activities and medications before you leave.    The above instructions have been reviewed and explained to me by   Clide Cliff, RN______________________    I fully understand  and can verbalize these instructions _____________________________ Date _________

## 2010-02-22 NOTE — Assessment & Plan Note (Signed)
Summary: INFECTED TOE//CCM   Vital Signs:  Patient profile:   70 year old female Menstrual status:  hysterectomy Weight:      166 pounds Temp:     98.9 degrees F oral Pulse rate:   72 / minute BP sitting:   120 / 80  (left arm) Cuff size:   regular  Vitals Entered By: Romualdo Bolk, CMA (AAMA) (March 26, 2009 8:13 AM) CC: This has been go on for several weeks. Rt big toe ? ingrown, sore to touch, blood underneath nail and when pt puts proxide on it, it boils out. Pt is also got some coughing and congestion. Rt ear feels clogged up as well. Pt had a temp 2-3 days ago but it is gone now. This started 2/26.   History of Present Illness: Madison Erickson comes  in today for   great toe changes over a month or so. Discolor and drainaing and tender at the top.  Irrigated with peroxide   and ? if had blood then.  No tauma . dark area for a few days.  2 Cold from grandchild.  getting better but coughing some clear phlegm . NO fever and no No sob.  No meds   3Had liver bx trying to lose weight.    Preventive Screening-Counseling & Management  Alcohol-Tobacco     Alcohol drinks/day: 0     Smoking Status: never  Caffeine-Diet-Exercise     Caffeine use/day: 3     Does Patient Exercise: no  Current Medications (verified): 1)  Clobetasol Propionate 0.05 % Crea (Clobetasol Propionate) .... Apply 1 A Small Amount To Affected Area As Directed 2)  Hydrochlorothiazide 25 Mg Tabs (Hydrochlorothiazide) .... Take 1 Tablet By Mouth Once A Day 3)  Nexium 40 Mg Cpdr (Esomeprazole Magnesium) .[name].. 1 By Mouth Once Daily 4)  Toprol Xl 50 Mg Xr24h-Tab (Metoprolol Succinate) .[name].. 1 By Mouth Once Daily 5)  Vitamin D 1000 Unit  Tabs (Cholecalciferol) .... Take One By Mouth As Needed 6)  Crestor 10 Mg Tabs (Rosuvastatin Calcium) .[name].. 1 By Mouth Once Daily 7)  Zyrtec Allergy 10 Mg Tabs (Cetirizine Hcl) .... Take One By Mouth Once Daily  Allergies (verified): 1)  Sulfamethoxazole (Sulfamethoxazole) 2)   Lipitor (Atorvastatin)  Past History:  Past medical, surgical, family and social histories (including risk factors) reviewed, and no changes noted (except as noted below).  Past Medical History: Reviewed history from 12/14/2008 and no changes required. Hematuria/Urethral Polyps Asthmatic Bronchtisi HT Osteoarthritis Arthritis Asthma GERD Kidney Disease Migraines Blood transfusion when young Urinary incontinence Eczema Abnormal lfts  fatty liver onn [place]  MRI  fatty liver and hemangioma 2009 Lichen Sclerosis Polyps in bladder benign tumor left kidney Colon Polyps - TV adenoma  Past Surgical History: Reviewed history from 12/14/2008 and no changes required. R Lung Lobectomy  for bronchiectasis Appendectomy Caesarean section x3 Hysterectomy colonoscopy..diverticulosis, polyp 2008  Past History:  Care Management: Gynecology: Dr. Senaida Ores Dermatology: Mayford Knife Urology: Gilles Chiquito in the past Gastroenterology: Leone Payor  Family History: Reviewed history from 12/14/2008 and no changes required. Family History Diabetes 1st degree relative: paternal grandfather Family History Lung cancer: mother (smoker) Family History Psychiatric care Family History of Cardiovascular disorder: mother No FH of Colon Cancer: Family History of Bladder Cancer: Paternal Grandmother  Social History: Reviewed history from 12/14/2008 and no changes required. 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 Married Never Smoked Alcohol use-yes 4-5 per year Drug use-no Regular exercise-no   hh of 2 no pets  Daily Caffeine Use 4-5 per day  Review of Systems  The patient denies anorexia, fever, weight loss, weight gain, vision loss, hoarseness, chest pain, syncope, dyspnea on exertion, peripheral edema, prolonged cough, headaches, abdominal pain, melena, muscle weakness, unusual weight change, abnormal bleeding, enlarged lymph nodes, and angioedema.         clogged right ear   Physical  Exam  General:  Well-developed,well-nourished,in no acute distress; alert,appropriate and cooperative throughout examination Head:  normocephalic and atraumatic.   Ears:  sligt wax left ear  wnl tm    Nose:  no external deformity, no external erythema, and no nasal discharge.   congestion  no face tenderness Mouth:  pharynx pink and moist.   Neck:  No deformities, masses, or tenderness noted. Lungs:  normal respiratory effort, no intercostal retractions, no accessory muscle use, no dullness, and no fremitus.  right base whith few wheeze and rhonchi  no rales good air movement  Heart:  normal rate, regular rhythm, no murmur, and no gallop.   Msk:  right great toenail with 1/3 nail  elvated with some minimal ooze and minimal tenderness   at base of   nail  dark  spot     and slight redness.   minimal to no ingrowing    but nail thickened  other nails nl except left great toe with resolving hematoma.  Pulses:  nl cap refill  Neurologic:  non focal  Skin:  turgor normal, color normal, no ecchymoses, and no petechiae.   Cervical Nodes:  No lymphadenopathy noted   Impression & Recommendations:  Problem # 1:  OTHER SPECIFIED DISEASE OF NAIL (ICD-703.8) prob  dermatophyte but has base of toe darkening ? could be blood  no direct trauma but did manipulate with peroxide  .  see derm  ro neoplasm .  Problem # 2:  COUGH (ICD-786.2) rti prob viral but at risk with her bronchiectesis     add antibiotic as directed   Problem # 3:  FATTY LIVER DISEASE (ICD-571.8) had liver x and working on losing weight.   Problem # 4:  Hx of BRONCHIECTASIS, RLLOBECTOMY (ICD-494.0) Assessment: Comment Only  Complete Medication List: 1)  Clobetasol Propionate 0.05 % Crea (Clobetasol propionate) .... Apply 1 a small amount to affected area as directed 2)  Hydrochlorothiazide 25 Mg Tabs (Hydrochlorothiazide) .... Take 1 tablet by mouth once a day 3)  Nexium 40 Mg Cpdr (Esomeprazole magnesium) .[name].. 1 by mouth once  daily 4)  Toprol Xl 50 Mg Xr24h-tab (Metoprolol succinate) .[name].. 1 by mouth once daily 5)  Vitamin D 1000 Unit Tabs (Cholecalciferol) .... Take one by mouth as needed 6)  Crestor 10 Mg Tabs (Rosuvastatin calcium) .[name].. 1 by mouth once daily 7)  Zyrtec Allergy 10 Mg Tabs (Cetirizine hcl) .... Take one by mouth once daily 8)  Doxycycline Hyclate 100 Mg Caps (Doxycycline hyclate) .[name].. 1 by mouth two times a day  Patient Instructions: 1)  this could be a fungus infection with trauma  and blood  but I think this should be seen  by your dermatologist.     2)  If cough not improving or increasing phlegm  begin antibiotic  Prescriptions: DOXYCYCLINE HYCLATE 100 MG CAPS (DOXYCYCLINE HYCLATE) 1 by mouth two times a day  #20 x 0   Entered and Authorized by:   Madelin Headings MD   Signed by:   Madelin Headings MD on 03/26/2009   Method used:   Print then Give to Patient  RxID:   [id number]

## 2010-02-22 NOTE — Assessment & Plan Note (Signed)
Summary: fu on meds/njr   Vital Signs:  Patient profile:   70 year old female Menstrual status:  hysterectomy Height:      63.5 inches Weight:      169 pounds BMI:     29.57 Pulse rate:   78 / minute BP sitting:   130 / 80  (left arm) Cuff size:   regular  Vitals Entered By: Romualdo Bolk, CMA (AAMA) (October 29, 2009 2:38 PM)  Nutrition Counseling: Patient's BMI is greater than 25 and therefore counseled on weight management options. CC: Follow-up visit on meds, Hypertension Management   History of Present Illness: Madison Erickson comes in today  for  follow up of multiple medical problems .   Last med check 12 /2010 last labs october 2011 since then had liver evaluation  with bx finding fatty liver  had colonscopy showing diverticulosis  Dr Priscille Kluver  did left knee surgery    June 22 .  But   still having problem cartilage       tear and floating body.  Has never had dkiverticulitis  but     has had  some pain left a t a time.     had been seen by gyne in the past    Lipoma left abdomen .  no change  BP has  been ok.  GERD:   nexium helps and when  stops gets rebound     was on this by ent.   ELR.    Crestor 10 mg   n se fo meds per patient. Could be eating better   .  less exercise because of knee Urology No recent uti didnt go back to urology   unsure if she wants to go back the new urologist didnt follow up uri as her previous had.  hs hx of bladder plyps and no cancer suspician   Hypertension History:      She denies headache, chest pain, palpitations, dyspnea with exertion, orthopnea, PND, peripheral edema, visual symptoms, neurologic problems, syncope, and side effects from treatment.  She notes no problems with any antihypertensive medication side effects.        Positive major cardiovascular risk factors include female age 30 years old or older, hyperlipidemia, and hypertension.  Negative major cardiovascular risk factors include non-tobacco-user status.     Preventive  Screening-Counseling & Management  Alcohol-Tobacco     Alcohol drinks/day: 0     Smoking Status: never  Caffeine-Diet-Exercise     Caffeine use/day: 3     Does Patient Exercise: no  Current Medications (verified): 1)  Clobetasol Propionate 0.05 % Crea (Clobetasol Propionate) .... Apply 1 A Small Amount To Affected Area As Directed 2)  Hydrochlorothiazide 25 Mg Tabs (Hydrochlorothiazide) .... Take 1 Tablet By Mouth Once A Day 3)  Nexium 40 Mg Cpdr (Esomeprazole Magnesium) .[name].. 1 By Mouth Once Daily 4)  Toprol Xl 50 Mg Xr24h-Tab (Metoprolol Succinate) .[name].. 1 By Mouth Once Daily 5)  Vitamin D 1000 Unit  Tabs (Cholecalciferol) .... Take One By Mouth As Needed 6)  Crestor 10 Mg Tabs (Rosuvastatin Calcium) .[name].. 1 By Mouth Once Daily 7)  Zyrtec Allergy 10 Mg Tabs (Cetirizine Hcl) .... Take One By Mouth Once Daily  Allergies (verified): 1)  Sulfamethoxazole (Sulfamethoxazole) 2)  Lipitor (Atorvastatin)  Past History:  Past medical, surgical, family and social histories (including risk factors) reviewed, and no changes noted (except as noted below).  Past Medical History: Hematuria/Urethral Polyps Asthmatic Bronchtisi HT Osteoarthritis Arthritis Asthma  GERD Kidney Disease Migraines Blood transfusion when young Urinary incontinence Eczema Abnormal lfts  fatty liver onn [place]  MRI  fatty liver and hemangioma 2009  biopsy 2011  Lichen Sclerosis Polyps in bladder benign tumor left kidney Colon Polyps - TV adenoma Diverticulosis, colon  Past Surgical History: R Lung Lobectomy  for bronchiectasis Appendectomy Caesarean section x3 Hysterectomy colonoscopy..diverticulosis, polyp 2008 Left knee surgery on a torn mansicus 07/14/09  Past History:  Care Management: Gynecology: Dr. Senaida Ores Dermatology: Mayford Knife Urology: Gilles Chiquito in the past Gastroenterology: Leone Payor Orthopedics: [facility]  Family History: Reviewed history from 12/14/2008 and no changes required. Family History  Diabetes 1st degree relative: paternal grandfather Family History Lung cancer: mother (smoker) Family History Psychiatric care Family History of Cardiovascular disorder: mother No FH of Colon Cancer: Family History of Bladder Cancer: Paternal Grandmother  Social History: Reviewed history from 12/14/2008 and no changes required. 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         legs hurt all the time she feels from the OA in her knees and not the crestor  Physical Exam  General:  Well-developed,well-nourished,in no acute distress; alert,appropriate and cooperative throughout examination Head:  normocephalic and atraumatic.   Eyes:  vision grossly intact.   Ears:  R ear normal, L ear normal, and no external deformities.   Mouth:  pharynx pink and moist.   Neck:  No deformities, masses, or tenderness noted. Lungs:  Normal respiratory effort, chest expands symmetrically. Lungs are clear to auscultation, no crackles or wheezes. healed scar on right thorax  no abn BS today Heart:  Normal rate and regular rhythm. S1 and S2 normal without gallop, murmur, click, rub or other extra sounds. Abdomen:  Bowel sounds positive,abdomen soft and non-tender without masses, organomegaly or hernias noted. Pulses:  pulses intact without delay   Extremities:  no clubbing cyanosis or edema  Neurologic:  alert & oriented X3 and gait normal.  except for slight limp. Skin:  turgor normal, color normal, no ecchymoses, and no petechiae.   Cervical Nodes:  No  lymphadenopathy noted Psych:  Oriented X3, memory intact for recent and remote, normally interactive, good eye contact, not anxious appearing, and not depressed appearing.     Impression & Recommendations:  Problem # 1:  HYPERTENSION (ICD-401.9) controlled Her updated medication list for this problem includes:    Hydrochlorothiazide 25 Mg Tabs (Hydrochlorothiazide) .[name]... Take 1 tablet by mouth once a day    Toprol Xl 50 Mg Xr24h-tab (Metoprolol succinate) .[name]... 1 by mouth once daily  BP today: 130/80 Prior BP: 120/80 (03/26/2009)  10 Yr Risk Heart Disease: 15 % Prior 10 Yr Risk Heart Disease: 9 % (01/04/2009)  Labs Reviewed: K+: 4.0 (10/26/2008) Creat: : 0.6 (10/26/2008)   Chol: 148 (12/28/2008)   HDL: 31.00 (12/28/2008)   LDL: 87 (12/28/2008)   TG: 148.0 (12/28/2008)  Problem # 2:  HYPERLIPIDEMIA (ICD-272.4)  Her updated medication list for this problem includes:    Crestor 10 Mg Tabs (Rosuvastatin calcium) .[name]... 1 by mouth once daily  Orders: TLB-TSH (Thyroid Stimulating Hormone) (84443-TSH) TLB-Hepatic/Liver Function Pnl (80076-HEPATIC) TLB-Lipid Panel (80061-LIPID) Specimen  Handling (16109) Venipuncture [phone])  Labs Reviewed: SGOT: 34 (12/28/2008)   SGPT: 43 (12/28/2008)  10 Yr Risk Heart Disease: 15 % Prior 10 Yr Risk Heart Disease: 9 % (01/04/2009)   HDL:31.00 (12/28/2008), 26.60 (10/26/2008)  LDL:87 (12/28/2008), 116 (09/81/1914)  Chol:148 (12/28/2008), 248 (10/26/2008)  Trig:148.0 (12/28/2008), 181.0 (10/26/2008)  Problem # 3:  HYPERGLYCEMIA, FASTING (ICD-790.29)  Orders: TLB-B12 + Folate Pnl (78295_62130-Q65/HQI) TLB-A1C / Hgb A1C (Glycohemoglobin) (83036-A1C) Specimen Handling (69629) Venipuncture (52841)  Problem # 4:  GERD/ESOPH LARYNGEAL REFLUX (ICD-530.81) discussed potential metabolic difficulties with long-term use of medication she may try to decreased dosing. Her updated medication list for this problem includes:    Nexium 40 Mg Cpdr  (Esomeprazole magnesium) .[name]... 1 by mouth once daily  Problem # 5:  ENCOUNTER FOR LONG-TERM USE OF OTHER MEDICATIONS (ICD-V58.69)  Orders: TLB-CBC Platelet - w/Differential (85025-CBCD) TLB-B12 + Folate Pnl (32440_10272-Z36/UYQ) TLB-Magnesium (Mg) (83735-MG) Specimen Handling (03474) Venipuncture (25956)  Problem # 6:  FATTY LIVER DISEASE (ICD-571.8) by biopsy. Certainly could intensify lifestyle intervention. Patient aware Orders: TLB-Hepatic/Liver Function Pnl (80076-HEPATIC)  Complete Medication List: 1)  Clobetasol Propionate 0.05 % Crea (Clobetasol propionate) .... Apply 1 a small amount to affected area as directed 2)  Hydrochlorothiazide 25 Mg Tabs (Hydrochlorothiazide) .... Take 1 tablet by mouth once a day 3)  Nexium 40 Mg Cpdr (Esomeprazole magnesium) .[name].. 1 by mouth once daily 4)  Toprol Xl 50 Mg Xr24h-tab (Metoprolol succinate) .[name].. 1 by mouth once daily 5)  Vitamin D 1000 Unit Tabs (Cholecalciferol) .... Take one by mouth as needed 6)  Crestor 10 Mg Tabs (Rosuvastatin calcium) .[name].. 1 by mouth once daily 7)  Zyrtec Allergy 10 Mg Tabs (Cetirizine hcl) .... Take one by mouth once daily  Other Orders: TLB-BMP (Basic Metabolic Panel-BMET) (80048-METABOL) UA Dipstick w/o Micro (automated)  (81003)  Hypertension Assessment/Plan:      The patient's hypertensive risk group is category B: At least one risk factor (excluding diabetes) with no target organ damage.  Her calculated 10 year risk of coronary heart disease is 15 %.  Today's blood pressure is 130/80.  Her blood pressure goal is < 140/90.  Patient Instructions: 1)  ok to try weaning  the nexium as discussed.  2)  You will be informed of lab results when available.  3)  follow up depending on results . [place] today not fasting Laboratory Results   Urine Tests  Date/Time Recieved: October 29, 2009 3:59 PM  Date/Time Reported: October 29, 2009 3:59 PM   Routine Urinalysis   Color: yellow Appearance:  Clear Glucose: negative   (Normal Range: Negative) Bilirubin: negative   (Normal Range: Negative) Ketone: negative   (Normal Range: Negative) Spec. Gravity: 1.025   (Normal Range: 1.003-1.035) Blood: 2+   (Normal Range: Negative) pH: 5.5   (Normal Range: 5.0-8.0) Protein: negative   (Normal Range: Negative) Urobilinogen: 0.2   (Normal Range: 0-1) Nitrite: negative   (Normal Range: Negative) Leukocyte Esterace: negative   (Normal Range: Negative)    Comments: Wynona Canes, CMA  October 29, 2009 3:59 PM

## 2010-02-24 NOTE — Assessment & Plan Note (Signed)
Summary: LLQ tenderness/constipation/sheri    History of Present Illness Visit Type: Follow-up Visit Primary GI MD: Stan Head MD [facility] Primary Provider: Berniece Andreas, MD Requesting Provider: Berniece Andreas, MD Chief Complaint: LLQ pain that woke pateint up Tuesday night, still tender: also constipation , not normal bowel movements since 1 day before pain started History of Present Illness:   PLEASANT 69 YO FEMALE KNOWN TO DR. Leone Payor. PT WITH HX OF SIGNIFICANT DIVERTICULAR DISEASE, AND ADENOMATOUS POLYPS. HE LAST COLONOSCOPY WAS IN 6/11-NO RECURRENT POLYPS,BUT HAD SEVERE SIGMOID DIVERTICOLOSIS. SHE HAS NEVER HAD DIVERTICULITIS TO HER KNOWLEDGE. SHE COMES IN TODAY WITH C/O ONSET OF LLQ PAIN IN TUESDAY 1/3 WHICH WOKE HER FROM SLEEP.SHE FELT CRAMPY AND GASSY, AND HAD BEEN FEELING A BIT CONSTIPATED PRIOR TO THAT WITH LESS FREQUENT BM'S. HE PAIN HAS PERSISTED WITH TENDERNESS IN THE LEFT ABDOMEN. NO BLEEDING. NO N/V. APPETITE IS OK. NO FEVER/CHILLS,  DENIES ANY URINARY SXS.    GI Review of Systems    Reports abdominal pain.     Location of  Abdominal pain: LLQ.    Denies acid reflux, belching, bloating, chest pain, dysphagia with liquids, dysphagia with solids, heartburn, loss of appetite, nausea, vomiting, vomiting blood, weight loss, and  weight gain.      Reports constipation and  diverticulosis.     Denies anal fissure, black tarry stools, change in bowel habit, diarrhea, fecal incontinence, heme positive stool, hemorrhoids, irritable bowel syndrome, jaundice, light color stool, liver problems, rectal bleeding, and  rectal pain.    Current Medications (verified): 1)  Clobetasol Propionate 0.05 % Crea (Clobetasol Propionate) .... Apply 1 A Small Amount To Affected Area As Directed 2)  Hydrochlorothiazide 25 Mg Tabs (Hydrochlorothiazide) .... Take 1 Tablet By Mouth Once A Day 3)  Nexium 40 Mg Cpdr (Esomeprazole Magnesium) .[name].. 1 By Mouth Every Other Day 4)  Toprol Xl 50 Mg Xr24h-Tab  (Metoprolol Succinate) .[name].. 1 By Mouth Once Daily 5)  Vitamin D 1000 Unit  Tabs (Cholecalciferol) .... Take One By Mouth As Needed 6)  Crestor 10 Mg Tabs (Rosuvastatin Calcium) .[name].. 1 By Mouth Once Daily 7)  Zyrtec Allergy 10 Mg Tabs (Cetirizine Hcl) .... Take One By Mouth Once Daily  Allergies (verified): 1)  Sulfamethoxazole (Sulfamethoxazole) 2)  Lipitor (Atorvastatin)  Past History:  Past Medical History: Hematuria/Urethral Polyps Asthmatic Bronchtis HT Osteoarthritis Arthritis Asthma GERD Kidney Disease Migraines Blood transfusion when young Urinary incontinence Eczema Abnormal lfts  fatty liver onn [place]  MRI  fatty liver and hemangioma 2009  biopsy 2011  Lichen Sclerosis Polyps in bladder benign tumor left kidney Colon Polyps - TUBOVILLOUS [id number] Diverticulosis, colon  Past Surgical History: R Lung Lobectomy  for bronchiectasis Appendectomy Caesarean section x3 Hysterectomy colonoscopy..diverticulosis, polyp 2008,2009,2011 Left knee surgery on a torn mansicus 07/14/09  Family History: Reviewed history from 12/14/2008 and no changes required. Family History Diabetes 1st degree relative: paternal grandfather Family History Lung cancer: mother (smoker) Family History Psychiatric care Family History of Cardiovascular disorder: mother No FH of Colon Cancer: Family History of Bladder Cancer: Paternal Grandmother  Social History: Reviewed history from 12/14/2008 and no changes required. 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  The patient denies anemia, anxiety-new, back pain, blood in urine, breast  changes/lumps, change in vision, confusion, coughing up blood, depression-new, fainting, fatigue, fever, headaches-new,  hearing problems, heart murmur, heart rhythm changes, itching, menstrual pain, muscle pains/cramps, night sweats, nosebleeds, pregnancy symptoms, shortness of breath, skin rash, sleeping problems, sore throat, swelling of feet/legs, swollen lymph glands, thirst - excessive , urination - excessive , urination changes/pain, urine leakage, vision changes, and voice change.         SEE HPI  Vital Signs:  Patient profile:   70 year old female Menstrual status:  hysterectomy Height:      63.5 inches Weight:      167.50 pounds BMI:     29.31 Pulse rate:   64 / minute Pulse rhythm:   regular BP sitting:   122 / 70  (left arm) Cuff size:   regular  Vitals Entered By: June McMurray CMA Duncan Dull) (January 27, 2010 1:24 PM)  Physical Exam  General:  Well developed, well nourished, no acute distress. Head:  Normocephalic and atraumatic. Eyes:  PERRLA, no icterus. Lungs:  Clear throughout to auscultation. Heart:  Regular rate and rhythm; no murmurs, rubs,  or bruits. Abdomen:  SOFT, TENDER LLQ,NO REBOUND OR GUARDING, NO MASS OR HSM,BS+ Rectal:  NOT DONE Extremities:  No clubbing, cyanosis, edema or deformities noted. Neurologic:  Alert and  oriented x4;  grossly normal neurologically. Psych:  Alert and cooperative. Normal mood and affect.   Impression & Recommendations:  Problem # 1:  DIVERTICULITIS-COLON (ICD-562.11) Assessment New 69 YO FEMALE WITH KNOWN SEVERE SIGMOID DIVERTICULOSIS WITH 2 DAY HX OF PERSISTENT LLQ PAIN ;CONSISTENT WITH DIVERTICULITIS.  CBC TODAY START CIPRO 500 MG TWICE DAILY X 10 DAYS START FLAGYL 500 MG TWICE DAILY X 10 DAYS FOLLOW UP WITH DR. Leone Payor AS NEEDED. SHE IS ADVISED TO CALL FOR ANY WORSENING OF HER SXS, AND/OR IF SXS HAVE NOT COMPLETELY RESOLVED WHEN SHE FINISHES THIS COURSE OF ABX DISCUSSED SOFT DIET, NO NUTS ,POPCORN  Problem # 2:  PERSONAL HX COLONIC POLYPS (ICD-V12.72) Assessment: Unchanged LAST COLON 6/11 NO RECURRENT ADENOMATOUS POLYPS-FOLLOW UP  PLANNED FOR 2016.  Problem # 3:  Hx of BRONCHIECTASIS, RLLOBECTOMY (ICD-494.0) Assessment: Comment Only  Other Orders: TLB-CBC Platelet - w/Differential (85025-CBCD)  Patient Instructions: 1)  Please go to lab, basement level. 2)  Follow up with Dr. Leone Payor as needed. 3)  Call us if your symptoms worsen. 4)  Copy sent to : Berniece Andreas, MD 5)  The medication list was reviewed and reconciled.  All changed / newly prescribed medications were explained.  A complete medication list was provided to the patient / caregiver. Prescriptions: METRONIDAZOLE 500 MG TABS (METRONIDAZOLE) Take 1 tab twice daily x 20 days  #20 x 0   Entered by:   Lowry Ram NCMA   Authorized by:   Sammuel Cooper PA-c   Signed by:   Lowry Ram NCMA on 01/27/2010   Method used:   Electronically to        [facility] [phone]* (retail)       [address]       [place], [place]  [address]       Ph: [phone]       Fax: [phone]   RxID:   [id number] CIPRO 500 MG TABS (CIPROFLOXACIN HCL) Take 1 tab twice daily x 10 days  #20 x 0   Entered by:   Lowry Ram NCMA   Authorized by:   Sammuel Cooper PA-c   Signed  by:   Lowry Ram NCMA on 01/27/2010   Method used:   Electronically to        [facility] [phone]* (retail)       [address]       [place], [place]  [address]       Ph: [phone]       Fax: [phone]   RxID:   [id number]   Appended Document: LLQ tenderness/constipation/sheri Metronidazole perscription was to be for 10 days, taking 1 tab twice daily # 20.

## 2010-02-24 NOTE — Progress Notes (Signed)
Summary: Triage   Phone Note Call from Patient Call back at Home Phone [phone]   Caller: Patient Call For: Dr. Leone Payor Reason for Call: Talk to Nurse Summary of Call: Pain in lower left side of abdomen, woke her up in the night on tuesday, pain comes and goes tender to touch  Initial call taken by: [place] Johnson,  January 27, 2010 9:27 AM  Follow-up for Phone Call        Patient c/o LLQ tenderness, change in bowel habits having some constipation.  Was having cramping, that has improved , but getting  more tender.  Patient is advised to begin a clear liquid diet, and come see Mike Gip PA today at 1:30. Follow-up by: Darcey Nora RN, CGRN,  January 27, 2010 9:59 AM

## 2010-04-10 LAB — APTT: aPTT: 28 seconds (ref 24–37)

## 2010-04-10 LAB — DIFFERENTIAL
Lymphocytes Relative: 27 % (ref 12–46)
Lymphs Abs: 2.4 10*3/uL (ref 0.7–4.0)
Neutro Abs: 5.8 10*3/uL (ref 1.7–7.7)

## 2010-04-10 LAB — CBC
MCHC: 34.1 g/dL (ref 30.0–36.0)
Platelets: 263 10*3/uL (ref 150–400)

## 2010-04-10 LAB — PROTIME-INR
INR: 0.96 (ref 0.00–1.49)
Prothrombin Time: 12.7 seconds (ref 11.6–15.2)

## 2010-04-22 ENCOUNTER — Encounter: Payer: Self-pay | Admitting: Internal Medicine

## 2010-04-22 ENCOUNTER — Ambulatory Visit (INDEPENDENT_AMBULATORY_CARE_PROVIDER_SITE_OTHER): Payer: Medicare Other | Admitting: Internal Medicine

## 2010-04-22 VITALS — BP 130/80 | HR 60 | Temp 99.0°F | Ht 63.5 in | Wt 170.0 lb

## 2010-04-22 DIAGNOSIS — IMO0002 Reserved for concepts with insufficient information to code with codable children: Secondary | ICD-10-CM

## 2010-04-22 DIAGNOSIS — Z902 Acquired absence of lung [part of]: Secondary | ICD-10-CM | POA: Insufficient documentation

## 2010-04-22 DIAGNOSIS — I10 Essential (primary) hypertension: Secondary | ICD-10-CM

## 2010-04-22 DIAGNOSIS — J302 Other seasonal allergic rhinitis: Secondary | ICD-10-CM | POA: Insufficient documentation

## 2010-04-22 DIAGNOSIS — H6980 Other specified disorders of Eustachian tube, unspecified ear: Secondary | ICD-10-CM

## 2010-04-22 DIAGNOSIS — R42 Dizziness and giddiness: Secondary | ICD-10-CM | POA: Insufficient documentation

## 2010-04-22 MED ORDER — FLUTICASONE PROPIONATE 50 MCG/ACT NA SUSP: 2.0000 | Freq: Every day | NASAL | Status: DC

## 2010-04-22 NOTE — Progress Notes (Signed)
Subjective:    Patient ID: Madison Erickson, female    DOB: 05/13/1940, 70 y.o.   MRN: [id number]  HPI Patient comes in today for an acute visit because he has a fullness feeling in her right ear for a number of days she just got back from the beach didn't get much water in it. She feels a little bit dizzy that is when she's getting out of the car and moving quickly but no spinning head or vertigo. She feels like her hearing is full on the right like she has to do a Valsalva to but not. She has some mild allergies but no major itching sneezing no falling vision changes fever.  No fever syncope palpitations. Feels like right ear is clogged? No  Pain.  She is taking the over-the-counter equivalent of Zyrtec generic.   Review of Systems No chest pain shortness of breath following new skin rashes vision changes or spells.  PMH updated and data entered  Past Medical History  Diagnosis Date  . Hematuria     urethral polyps  . HT (hammer toe)   . Osteoarthritis   . Lichen sclerosus   . Asthma   . GERD (gastroesophageal reflux disease)   . Kidney disease   . Migraines   . Blood transfusion abn reaction or complication, no procedure mishap   . Urinary incontinence   . Eczema   . Abnormal LFTs     fatty liver on [place] MRI faay liver and hemangioma 2008 biopsy 2011  . Bladder polyps   . Benign tumor   . Colon polyp 2009    Tubulovillous adenoma   . Diverticulosis of colon   . Status post partial lobectomy of lung     for bronchiesctesis   Past Surgical History  Procedure Date  . Rt lung lobectomy     for brochiectasis  . Appendectomy   . Caesarean section     X 3  . Vaginal hysterectomy   . Left knee surgery on torn mansicus     reports that she has never smoked. She does not have any smokeless tobacco history on file. She reports that she drinks alcohol. Her drug history not on file. family history includes Cancer in her mother and paternal grandfather; Diabetes in her paternal  grandfather; Heart disease in her mother; and Mental retardation in her other. Allergies  Allergen Reactions  . Atorvastatin     REACTION: muscle pain  . Sulfamethoxazole     REACTION: unspecified       Objective:   Physical Exam Well-developed well-nourished in no acute distress HEENT normocephalic eyes PERRLA EOMs are full no redness noted nares slight congestion and P. clear without lesions good airway Ears: EACs some wax right more than left TM appears to be gray . irrigated on the right negative tenderness    TMs normal landmarks but appeared full with possible clear fluid. No redness or otherwise dullness. LN neg cervical adenopathy  Cardiovascular S1-S2 no gallops or murmurs Neurologic oriented x 3  cranial nerves III through XII appear intact gait is normal nonfocal negative Romberg.NO tremor Oriented x 3. Normal cognition, attention, speech. Not anxious or depressed appearing   Good eye contact .   Assessment & Plan:  Eustachian tube dysfunction   Continued symptoms after wax removed and TM intact but appeared full Dizziness seemingly positional when she stands up but no focal neurologic symptoms. I feel this is probably secondary her upper respiratory congestion Allergic rhinitis probable  Hypertension controlled  Short-term decongestants a moderate her her blood pressure and add inhaled nasal cortisones for control. Followup if persistent and/or progressive.

## 2010-04-22 NOTE — Patient Instructions (Signed)
This acts like eustachian tube dysfunction and allergy. I recommend continuing your Zyrtec add a decongestant for at least 3-4 days to see if it decreases the clogged feeling in the right ear Take nasal cortisone every day to help control allergies in the season.  If not improved in the next week or 2 then call work if worse.

## 2010-04-24 NOTE — Assessment & Plan Note (Signed)
Controlled  No related to above issue.

## 2010-05-05 LAB — CBC
HCT: 42.2 % (ref 36.0–46.0)
Platelets: 278 10*3/uL (ref 150–400)
WBC: 12.8 10*3/uL — ABNORMAL HIGH (ref 4.0–10.5)

## 2010-05-05 LAB — DIFFERENTIAL
Eosinophils Relative: 1 % (ref 0–5)
Lymphocytes Relative: 15 % (ref 12–46)
Lymphs Abs: 1.9 10*3/uL (ref 0.7–4.0)
Neutro Abs: 9.7 10*3/uL — ABNORMAL HIGH (ref 1.7–7.7)

## 2010-06-10 NOTE — Op Note (Signed)
   Madison Erickson, Madison Erickson                            ACCOUNT NO.:  [id number]   MEDICAL RECORD NO.:  [id number]                   PATIENT TYPE:  AMB   LOCATION:  [facility]                                 FACILITY:  [facility]   PHYSICIAN:  Claudette Laws, M.D.               DATE OF BIRTH:  1940-12-03   DATE OF PROCEDURE:  11/21/2001  DATE OF DISCHARGE:                                 OPERATIVE REPORT   PREOPERATIVE DIAGNOSES:  1. Microscopic hematuria.  2. History of positive urine cytology.  3. Hypertension.   POSTOPERATIVE DIAGNOSES:  1. Microscopic hematuria.  2. History of positive urine cytology.  3. Hypertension.   PROCEDURE:  Cystoscopy.  Bilateral pelvic renal washings for cytology.  Bilateral retrograde pyelograms.  Multiple cold cup biopsies.   SURGEON:  Claudette Laws, M.D.   DESCRIPTION OF PROCEDURE:  The patient was prepped and draped in the dorsal  lithotomy position under LMA anesthesia.  Cystoscopy with pictures showed a  normal appearing bladder with no tumors, no suspicious areas.  As we dilated  the bladder there were a few petechiae that developed.  The trigone revealed  some squamous metaplasia and normal ureteral orifices.   Initially I passed up a 5 French whistle tip ureteral catheter up both  ureters to the renal pelvis.  I barbotaged both kidneys and sent those  separately for urine cytology.  We then performed retrograde pyelogram  studies.  Appropriate documentation was obtained.  There appeared to be  normal collecting systems.  No filling defects.  No hydronephrosis.  Delicate calyces bilaterally and both ureters were seen in their normal  course down to the bladder without any filling defects or hydroureter.   I then took several cold cup biopsies around the trigonal area and then  electrocoagulated these areas with the Bugbee electrode at 40 coag.   The bladder was empty.  She was given a B&P suppository per rectum for  anesthetics purposes as well as  10 cc of Xylocaine jelly into the bladder.   The patient was then taken back to the recovery room in satisfactory  condition.                                               Claudette Laws, M.D.    RFS/MEDQ  D:  11/21/2001  T:  11/21/2001  Job:  [id number]

## 2010-06-27 ENCOUNTER — Other Ambulatory Visit: Payer: Self-pay | Admitting: Internal Medicine

## 2010-06-27 DIAGNOSIS — Z1231 Encounter for screening mammogram for malignant neoplasm of breast: Secondary | ICD-10-CM

## 2010-07-08 ENCOUNTER — Other Ambulatory Visit: Payer: Self-pay | Admitting: Internal Medicine

## 2010-07-25 ENCOUNTER — Ambulatory Visit
Admission: RE | Admit: 2010-07-25 | Discharge: 2010-07-25 | Disposition: A | Discharge status: Home / Self Care | Payer: Medicare Other | Source: Ambulatory Visit | Attending: Internal Medicine | Admitting: Internal Medicine

## 2010-07-25 DIAGNOSIS — Z1231 Encounter for screening mammogram for malignant neoplasm of breast: Secondary | ICD-10-CM

## 2010-08-11 ENCOUNTER — Telehealth: Payer: Self-pay | Admitting: *Deleted

## 2010-08-11 MED ORDER — ESOMEPRAZOLE MAGNESIUM 40 MG PO CPDR: DELAYED_RELEASE_CAPSULE | ORAL | Status: DC

## 2010-08-11 MED ORDER — ROSUVASTATIN CALCIUM 10 MG PO TABS: ORAL_TABLET | ORAL | Status: DC

## 2010-08-11 NOTE — Telephone Encounter (Signed)
rx sent

## 2010-08-16 ENCOUNTER — Other Ambulatory Visit: Payer: Self-pay | Admitting: Orthopedic Surgery

## 2010-08-16 ENCOUNTER — Ambulatory Visit
Admission: RE | Admit: 2010-08-16 | Discharge: 2010-08-16 | Disposition: A | Discharge status: Home / Self Care | Payer: Medicare Other | Source: Ambulatory Visit | Attending: Orthopedic Surgery | Admitting: Orthopedic Surgery

## 2010-08-16 DIAGNOSIS — M79669 Pain in unspecified lower leg: Secondary | ICD-10-CM

## 2010-08-22 ENCOUNTER — Other Ambulatory Visit: Payer: Self-pay | Admitting: Internal Medicine

## 2010-08-25 ENCOUNTER — Telehealth: Payer: Self-pay | Admitting: *Deleted

## 2010-08-25 MED ORDER — NITROFURANTOIN MONOHYD MACRO 100 MG PO CAPS: 100.0000 mg | ORAL_CAPSULE | Freq: Two times a day (BID) | ORAL | Status: AC

## 2010-08-25 NOTE — Telephone Encounter (Signed)
Addended by: Romualdo Bolk on: 08/25/2010 12:33 PM   Modules accepted: Orders

## 2010-08-25 NOTE — Telephone Encounter (Addendum)
Per Dr. Fabian Sharp- Macrobid 1 bid x 7 days.  Need date of surgery: July 18th Dr. Priscille Kluver.  Rx sent to pharmacy and pt aware.

## 2010-08-25 NOTE — Telephone Encounter (Signed)
Pt is having dysuria x 3 days, and just had knee surgery which makes it difficult to ambulate.  She is asking if Dr. Fabian Sharp will call in meds for UTI to [facility]).  No fever.  Has taken Azo, and urine is discolored.

## 2010-09-05 ENCOUNTER — Telehealth: Payer: Self-pay | Admitting: Internal Medicine

## 2010-09-05 MED ORDER — METOPROLOL SUCCINATE ER 50 MG PO TB24: ORAL_TABLET | ORAL | Status: DC

## 2010-09-05 MED ORDER — ROSUVASTATIN CALCIUM 10 MG PO TABS: ORAL_TABLET | ORAL | Status: DC

## 2010-09-05 MED ORDER — ESOMEPRAZOLE MAGNESIUM 40 MG PO CPDR: DELAYED_RELEASE_CAPSULE | ORAL | Status: DC

## 2010-09-05 MED ORDER — HYDROCHLOROTHIAZIDE 25 MG PO TABS: 25.0000 mg | ORAL_TABLET | Freq: Every day | ORAL | Status: DC

## 2010-09-05 NOTE — Telephone Encounter (Signed)
[facility] and I put on there to be send stat.

## 2010-09-05 NOTE — Telephone Encounter (Signed)
[facility]  For Crestor 10mg , metoprolol 50mg , Nexium 40mg , and Hydrochlorothiazide 25mg . When prescription was called in they were missing quantity [facility].  Pt also requested to be contacted she will be out of medication in 3 day and isn't sure what to do because the refills with not be there in time. Pt requested you to contact her on her cell @ [phone]

## 2010-09-06 ENCOUNTER — Encounter: Payer: Self-pay | Admitting: Internal Medicine

## 2010-09-06 ENCOUNTER — Ambulatory Visit (INDEPENDENT_AMBULATORY_CARE_PROVIDER_SITE_OTHER): Payer: Medicare Other | Admitting: Internal Medicine

## 2010-09-06 VITALS — BP 132/80 | HR 60 | Wt 168.0 lb

## 2010-09-06 DIAGNOSIS — M25561 Pain in right knee: Secondary | ICD-10-CM

## 2010-09-06 DIAGNOSIS — M1990 Unspecified osteoarthritis, unspecified site: Secondary | ICD-10-CM

## 2010-09-06 DIAGNOSIS — N390 Urinary tract infection, site not specified: Secondary | ICD-10-CM

## 2010-09-06 DIAGNOSIS — Z9889 Other specified postprocedural states: Secondary | ICD-10-CM

## 2010-09-06 DIAGNOSIS — Z902 Acquired absence of lung [part of]: Secondary | ICD-10-CM

## 2010-09-06 DIAGNOSIS — IMO0002 Reserved for concepts with insufficient information to code with codable children: Secondary | ICD-10-CM

## 2010-09-06 DIAGNOSIS — E785 Hyperlipidemia, unspecified: Secondary | ICD-10-CM

## 2010-09-06 DIAGNOSIS — M25569 Pain in unspecified knee: Secondary | ICD-10-CM

## 2010-09-06 DIAGNOSIS — K7689 Other specified diseases of liver: Secondary | ICD-10-CM

## 2010-09-06 NOTE — Patient Instructions (Signed)
Fasting labs  This week Ok to take antiinflammatory for  Up to 2 weeks  400 to 600 mg  Ibuprofen up to 3 x per day max.   ( 2-3  otc 200 mg advil)  Will notify you  of labs when available. Then plan any follow up .  Ok to refill meds when due after labs done.

## 2010-09-06 NOTE — Progress Notes (Signed)
  Subjective:    Patient ID: Madison Erickson, female    DOB: 1940-10-11, 70 y.o.   MRN: [id number]  HPI Patient comes in for" fu appt .   Next day appt. "  However there are a couple o issues including new problem and opinion wanted  on her knee predicament For medicine  eval  also Had knee surgery But still hurts and swollen ? what to take with knee. Because of fatty liver nad hx of abnormal lfts.  Pain   Not that helped with Skelaxin  1/2 400 mg   And spasm   And then hydrocodone vicodin   1/2     Prn.  Surgery was for  4 tears in   Cartilage and arthritis. Had surgery   In July  Dr Priscille Kluver .  Sttill hurts.  Due for check up and refill  Needed.  Vit d hx of low levels  LIPIDS nose of meds WU:[id number] control GERD :on meds   Review of Systems Neg fever cp sob bleeding falling  New cough sib recent infection.  Gross hematuria. ( has had eval for micro hematuria)  Did have a uti      Past history family history social history reviewed in the electronic medical record.  Objective:   Physical Exam  WDWN in nad limping with cane    Neck no massses Chest:  Clear to A&P without wheezes rales or rhonchi  sligh tec right  CV:  S1-S2 no gallops or murmurs peripheral perfusion is normal Abdomen:  Sof,t normal bowel sounds without hepatosplenomegaly, no guarding rebound or masses no CVA tenderness extr: right knee  1+ swelling no redness      Assessment & Plan:  Right knee pain and swelling post surgery for cartilage tear and also arthritis Labs fasting in am and then plan follow up as appropriate . Care with meds  Fatty liver   Last labs  Were ok.   Ok to take meds with cautions Follow up treatment for UTI  Post/ op HT stable LIPIDS  Stay on meds and get labs

## 2010-09-07 ENCOUNTER — Ambulatory Visit: Payer: Medicare Other | Attending: Orthopedic Surgery | Admitting: Physical Therapy

## 2010-09-07 ENCOUNTER — Other Ambulatory Visit (INDEPENDENT_AMBULATORY_CARE_PROVIDER_SITE_OTHER): Payer: Medicare Other

## 2010-09-07 DIAGNOSIS — E039 Hypothyroidism, unspecified: Secondary | ICD-10-CM

## 2010-09-07 DIAGNOSIS — D649 Anemia, unspecified: Secondary | ICD-10-CM

## 2010-09-07 DIAGNOSIS — N390 Urinary tract infection, site not specified: Secondary | ICD-10-CM

## 2010-09-07 DIAGNOSIS — K7689 Other specified diseases of liver: Secondary | ICD-10-CM

## 2010-09-07 DIAGNOSIS — E756 Lipid storage disorder, unspecified: Secondary | ICD-10-CM

## 2010-09-07 DIAGNOSIS — IMO0001 Reserved for inherently not codable concepts without codable children: Secondary | ICD-10-CM | POA: Insufficient documentation

## 2010-09-07 DIAGNOSIS — M25569 Pain in unspecified knee: Secondary | ICD-10-CM | POA: Insufficient documentation

## 2010-09-07 DIAGNOSIS — I10 Essential (primary) hypertension: Secondary | ICD-10-CM

## 2010-09-07 DIAGNOSIS — M25669 Stiffness of unspecified knee, not elsewhere classified: Secondary | ICD-10-CM | POA: Insufficient documentation

## 2010-09-07 LAB — CBC WITH DIFFERENTIAL/PLATELET
Basophils Absolute: 0.1 10*3/uL (ref 0.0–0.1)
Basophils Relative: 0.6 % (ref 0.0–3.0)
Eosinophils Absolute: 0.2 10*3/uL (ref 0.0–0.7)
HCT: 39.5 % (ref 36.0–46.0)
Hemoglobin: 13.2 g/dL (ref 12.0–15.0)
Lymphs Abs: 3 10*3/uL (ref 0.7–4.0)
MCHC: 33.5 g/dL (ref 30.0–36.0)
MCV: 89.6 fl (ref 78.0–100.0)
Monocytes Absolute: 0.6 10*3/uL (ref 0.1–1.0)
Neutro Abs: 4.1 10*3/uL (ref 1.4–7.7)
RDW: 11.8 % (ref 11.5–14.6)

## 2010-09-07 LAB — BASIC METABOLIC PANEL
CO2: 30 mEq/L (ref 19–32)
Calcium: 9.5 mg/dL (ref 8.4–10.5)
Chloride: 106 mEq/L (ref 96–112)
Glucose, Bld: 111 mg/dL — ABNORMAL HIGH (ref 70–99)
Potassium: 3.4 mEq/L — ABNORMAL LOW (ref 3.5–5.1)
Sodium: 143 mEq/L (ref 135–145)

## 2010-09-07 LAB — HEPATIC FUNCTION PANEL
Albumin: 4.3 g/dL (ref 3.5–5.2)
Total Protein: 6.9 g/dL (ref 6.0–8.3)

## 2010-09-07 LAB — LIPID PANEL
Cholesterol: 148 mg/dL (ref 0–200)
HDL: 37 mg/dL — ABNORMAL LOW (ref >39.00)
VLDL: 34.2 mg/dL (ref 0.0–40.0)

## 2010-09-07 LAB — POCT URINALYSIS DIPSTICK
Bilirubin, UA: NEGATIVE
Ketones, UA: NEGATIVE
Leukocytes, UA: NEGATIVE
Protein, UA: NEGATIVE
Spec Grav, UA: 1.025
pH, UA: 5.5

## 2010-09-07 LAB — TSH: TSH: 1.98 u[IU]/mL (ref 0.35–5.50)

## 2010-09-14 ENCOUNTER — Telehealth: Payer: Self-pay | Admitting: *Deleted

## 2010-09-14 MED ORDER — ROSUVASTATIN CALCIUM 10 MG PO TABS: ORAL_TABLET | ORAL | Status: DC

## 2010-09-14 MED ORDER — METOPROLOL SUCCINATE ER 50 MG PO TB24: ORAL_TABLET | ORAL | Status: DC

## 2010-09-14 NOTE — Telephone Encounter (Signed)
Pt aware of results. She wants a 30 days supply to [facility]

## 2010-09-15 ENCOUNTER — Ambulatory Visit: Payer: Medicare Other | Admitting: Physical Therapy

## 2010-09-20 ENCOUNTER — Ambulatory Visit: Payer: Medicare Other | Admitting: Physical Therapy

## 2010-09-22 ENCOUNTER — Ambulatory Visit: Payer: Medicare Other | Admitting: Physical Therapy

## 2010-09-28 ENCOUNTER — Ambulatory Visit: Payer: Medicare Other | Attending: Orthopedic Surgery | Admitting: Physical Therapy

## 2010-09-28 DIAGNOSIS — M25669 Stiffness of unspecified knee, not elsewhere classified: Secondary | ICD-10-CM | POA: Insufficient documentation

## 2010-09-28 DIAGNOSIS — IMO0001 Reserved for inherently not codable concepts without codable children: Secondary | ICD-10-CM | POA: Insufficient documentation

## 2010-09-28 DIAGNOSIS — M25569 Pain in unspecified knee: Secondary | ICD-10-CM | POA: Insufficient documentation

## 2010-09-30 ENCOUNTER — Ambulatory Visit: Payer: Medicare Other | Admitting: Physical Therapy

## 2010-10-03 ENCOUNTER — Ambulatory Visit: Payer: Medicare Other | Admitting: Physical Therapy

## 2010-10-04 ENCOUNTER — Other Ambulatory Visit: Payer: Self-pay | Admitting: Internal Medicine

## 2010-10-05 ENCOUNTER — Ambulatory Visit: Payer: Medicare Other | Admitting: Physical Therapy

## 2010-10-12 ENCOUNTER — Ambulatory Visit: Payer: Medicare Other | Admitting: Physical Therapy

## 2010-10-13 ENCOUNTER — Ambulatory Visit: Payer: Medicare Other | Admitting: Physical Therapy

## 2010-10-18 ENCOUNTER — Ambulatory Visit: Payer: Medicare Other | Admitting: Physical Therapy

## 2010-10-20 ENCOUNTER — Encounter: Payer: Medicare Other | Admitting: Physical Therapy

## 2010-12-01 ENCOUNTER — Telehealth: Payer: Self-pay | Admitting: Internal Medicine

## 2010-12-01 MED ORDER — METOPROLOL SUCCINATE ER 50 MG PO TB24: ORAL_TABLET | ORAL | Status: DC

## 2010-12-01 MED ORDER — HYDROCHLOROTHIAZIDE 25 MG PO TABS: 25.0000 mg | ORAL_TABLET | Freq: Every day | ORAL | Status: DC

## 2010-12-01 MED ORDER — ROSUVASTATIN CALCIUM 10 MG PO TABS: ORAL_TABLET | ORAL | Status: DC

## 2010-12-01 NOTE — Telephone Encounter (Signed)
Rx sent to pharmacy   

## 2010-12-01 NOTE — Telephone Encounter (Signed)
Pt called req  90 day supply with refills for rosuvastatin (CRESTOR) 10 MG tablet, metoprolol (TOPROL-XL) 50 MG 24 hr tablet, hydrochlorothiazide 25 MG tablet to [facility].

## 2011-03-06 ENCOUNTER — Encounter (HOSPITAL_BASED_OUTPATIENT_CLINIC_OR_DEPARTMENT_OTHER): Payer: Self-pay | Admitting: Family Medicine

## 2011-03-06 ENCOUNTER — Emergency Department (HOSPITAL_BASED_OUTPATIENT_CLINIC_OR_DEPARTMENT_OTHER)
Admission: EM | Admit: 2011-03-06 | Discharge: 2011-03-06 | Disposition: A | Discharge status: Home / Self Care | Payer: Medicare Other | Attending: Emergency Medicine | Admitting: Emergency Medicine

## 2011-03-06 DIAGNOSIS — M5430 Sciatica, unspecified side: Secondary | ICD-10-CM | POA: Insufficient documentation

## 2011-03-06 DIAGNOSIS — J40 Bronchitis, not specified as acute or chronic: Secondary | ICD-10-CM | POA: Diagnosis not present

## 2011-03-06 DIAGNOSIS — M549 Dorsalgia, unspecified: Secondary | ICD-10-CM | POA: Diagnosis not present

## 2011-03-06 DIAGNOSIS — Z79899 Other long term (current) drug therapy: Secondary | ICD-10-CM | POA: Insufficient documentation

## 2011-03-06 DIAGNOSIS — K219 Gastro-esophageal reflux disease without esophagitis: Secondary | ICD-10-CM | POA: Diagnosis not present

## 2011-03-06 DIAGNOSIS — M5432 Sciatica, left side: Secondary | ICD-10-CM

## 2011-03-06 MED ORDER — HYDROMORPHONE HCL 4 MG PO TABS: 4.0000 mg | ORAL_TABLET | Freq: Four times a day (QID) | ORAL | Status: DC | PRN

## 2011-03-06 MED ORDER — HYDROMORPHONE HCL PF 2 MG/ML IJ SOLN
2.0000 mg | Freq: Once | INTRAMUSCULAR | Status: AC
  Administered 2011-03-06: 2 mg via INTRAMUSCULAR
  Filled 2011-03-06: qty 1

## 2011-03-06 MED ORDER — PREDNISONE 20 MG PO TABS: ORAL_TABLET | ORAL | Status: AC

## 2011-03-06 NOTE — ED Provider Notes (Signed)
This chart was scribed for Hurman Horn, MD by Wallis Mart. The patient was seen in room MHCT3/MHCT3 and the patient's care was started at 8:00 PM.   CSN: [id number]  Arrival date & time 03/06/11  5621   First MD Initiated Contact with Patient 03/06/11 1947      Chief Complaint  Patient presents with  . Back Pain    (Consider location/radiation/quality/duration/timing/severity/associated sxs/prior treatment) HPI Madison Erickson is a 71 y.o. female who presents to the Emergency Department complaining of gradual onset, gradually worsening, persistence of constant left lower back pain that radiates to the left thigh, onset this evening. Pt has chronic back pain but describes the current episode as "excruciating" and has "never had it this bad before". Pt took a skelaxin at 8am and using heating pad with no relief.  Pain lessens when lying down and worsens when moving.  Pt w/ h/o left sided sciatica and bulging disks. No midline back pain, no fever, no IVDA, no pain below the knee, no weakness or numbness to left leg, no change in bowel or bladder function.   There are no other associated symptoms and no other alleviating or aggravating factors.  There are no other associated symptoms and no other alleviating or aggravating factors.  Past Medical History  Diagnosis Date  . Hematuria     urethral polyps  . HT (hammer toe)   . Osteoarthritis   . Lichen sclerosus   . Asthma   . GERD (gastroesophageal reflux disease)   . Kidney disease   . Migraines   . Blood transfusion abn reaction or complication, no procedure mishap   . Urinary incontinence   . Eczema   . Abnormal LFTs     fatty liver on [place] MRI faay liver and hemangioma 2008 biopsy 2011  . Bladder polyps   . Benign tumor   . Colon polyp 2009    Tubulovillous adenoma   . Diverticulosis of colon   . Status post partial lobectomy of lung     for bronchiesctesis    Past Surgical History  Procedure Date  . Rt lung lobectomy    for brochiectasis  . Appendectomy   . Caesarean section     X 3  . Vaginal hysterectomy   . Left knee surgery on torn mansicus     Family History  Problem Relation Age of Onset  . Cancer Mother     lung  . Heart disease Mother   . Diabetes Paternal Grandfather   . Cancer Paternal Grandfather     bladder  . Mental retardation Other     History  Substance Use Topics  . Smoking status: Never Smoker   . Smokeless tobacco: Not on file  . Alcohol Use: Yes     4-5 per year     OB History    Grav Para Term Preterm Abortions TAB SAB Ect Mult Living                  Review of Systems  Constitutional: Negative for fever.       10 Systems reviewed and are negative for acute change except as noted in the HPI.  HENT: Negative for congestion.   Eyes: Negative for discharge and redness.  Respiratory: Negative for cough and shortness of breath.   Cardiovascular: Negative for chest pain.  Gastrointestinal: Negative for vomiting and abdominal pain.  Musculoskeletal: Positive for back pain.  Skin: Negative for rash.  Neurological: Negative for syncope, numbness and  headaches.  Psychiatric/Behavioral:       No behavior change.    Allergies  Atorvastatin and Sulfamethoxazole  Home Medications   Current Outpatient Rx  Name Route Sig Dispense Refill  . CETIRIZINE HCL 10 MG PO TABS Oral Take 10 mg by mouth daily.      [name] CLOBETASOL PROPIONATE 0.05 % EX CREA Topical Apply 1 application topically 2 (two) times daily.      [name] HYDROCODONE-ACETAMINOPHEN 5-325 MG PO TABS Oral Take 1 tablet by mouth every 6 (six) hours as needed. For pain    . IBUPROFEN 200 MG PO TABS Oral Take 400 mg by mouth every 6 (six) hours as needed. For pain    . METAXALONE 800 MG PO TABS Oral Take 800 mg by mouth 3 (three) times daily.    [name] ESOMEPRAZOLE MAGNESIUM 40 MG PO CPDR  Take 1 capsule Every other day 90 capsule 1  . HYDROCHLOROTHIAZIDE 25 MG PO TABS Oral Take 1 tablet (25 mg total) by mouth daily. 90  tablet 1  . HYDROMORPHONE HCL 4 MG PO TABS Oral Take 1 tablet (4 mg total) by mouth every 6 (six) hours as needed for pain. 10 tablet 0  . METOPROLOL SUCCINATE ER 50 MG PO TB24  Take 1 tab daily 90 tablet 1  . PREDNISONE 20 MG PO TABS  3 tabs po day one, then 2 po daily x 4 days 11 tablet 0  . ROSUVASTATIN CALCIUM 10 MG PO TABS  Take 1 tablet daily 90 tablet 1    BP 198/96  Pulse 84  Temp(Src) 98.5 F (36.9 C) (Oral)  Resp 18  SpO2 98%  Physical Exam  Nursing note and vitals reviewed. Constitutional: She is oriented to person, place, and time.       Awake, alert, nontoxic appearance with baseline speech.  HENT:  Head: Atraumatic.  Eyes: Pupils are equal, round, and reactive to light. Right eye exhibits no discharge. Left eye exhibits no discharge.  Neck: Neck supple.  Cardiovascular: Normal rate and regular rhythm.  Exam reveals no friction rub.   No murmur heard. Pulmonary/Chest: Effort normal and breath sounds normal. No respiratory distress. She has no wheezes. She has no rales. She exhibits no tenderness.  Abdominal: Soft. Bowel sounds are normal. She exhibits no mass. There is no tenderness. There is no rebound.  Musculoskeletal: Normal range of motion. She exhibits tenderness. She exhibits no edema.       Thoracic back: She exhibits no tenderness.       Lumbar back: She exhibits no tenderness.       Mild left sacral tenderness. Bilateral lower extremities non tender without new rashes or color change, baseline ROM with intact DP / PT pulses, CR<2 secs all digits bilaterally, sensation baseline light touch bilaterally for pt, DTR's symmetric and intact bilaterally KJ / AJ, motor symmetric bilateral 5 / 5 hip flexion, quadriceps, hamstrings, EHL, foot dorsiflexion, foot plantarflexion, gait somewhat antalgic but without apparent new ataxia.  Neurological: She is oriented to person, place, and time. She has normal reflexes. Coordination normal.       Mental status baseline for  patient.  Upper extremity motor strength and sensation intact and symmetric bilaterally.  Skin: No rash noted. No erythema.  Psychiatric: She has a normal mood and affect. Her behavior is normal.    ED Course  Procedures (including critical care time) DIAGNOSTIC STUDIES: Oxygen Saturation is 98% on room air, normal by my interpretation.    COORDINATION OF CARE:  Labs Reviewed - No data to display No results found.   1. Sciatica of left side       MDM  I doubt any other EMC precluding discharge at this time including, but not necessarily limited to the following:AAA, SBI, cauda equina.  I personally performed the services described in this documentation, which was scribed in my presence. The recorded information has been reviewed and considered.           Hurman Horn, MD 03/10/11 2249

## 2011-03-06 NOTE — ED Notes (Addendum)
Pt c/o low back pain radiating down side of left leg. Pt sts she has h/o bulging discs. Pt sts she took skelaxin at 8am and hydrocodone about 1 hr ago. Pt sts she has been using heating pad today without relief.

## 2011-03-09 ENCOUNTER — Other Ambulatory Visit: Payer: Self-pay | Admitting: Internal Medicine

## 2011-03-09 NOTE — Telephone Encounter (Signed)
Pt called back to check on status of getting refill for Hydromorphone 4 mg. Pt said that this med was given to her in ER and pt has no way of contacting ER doctor to get refill. Pt is out of med and is in extreme pain and is very difficult to walk. Pt dx with lft sided sciatica.   Also Dr Fabian Sharp had aprroved refills for pts Toprol,Crestor, hctz and Nexium, but nothing has been sent in to [facility] yet. Pls call in.

## 2011-03-09 NOTE — Telephone Encounter (Addendum)
Pt need refills on toprol-xl 50 mg,crestor 10mg ,hctz 25 mg and nexium 40mg  all rx #90 with 3 refills [facility]. Pt was seen at er and received rx for hydromorphone 4mg  tablet #10 by Dr Fonnie Jarvis for back pain. Pt has appt sch with doc stern 03-13-2011 for back pain/bulging disc. Pt would like refill on hydromorphone 4mg  call into [facility] [phone], pt takes a pill every 6 hrs.

## 2011-03-09 NOTE — Telephone Encounter (Signed)
Last visit here was  Madison Erickson to refill for 6 months only the toprol crestor  hctz and Nexium   due for wellness visit and labs  No later than July .   Pain med should be prescribed by treating doctors . And I am not involved with this. Call the original prescriber or the specialist . The med she is taking   is a scheduled drug . And cannot be called in to the pharmacy.

## 2011-03-10 MED ORDER — ROSUVASTATIN CALCIUM 10 MG PO TABS: ORAL_TABLET | ORAL | Status: DC

## 2011-03-10 MED ORDER — METOPROLOL SUCCINATE ER 50 MG PO TB24: ORAL_TABLET | ORAL | Status: DC

## 2011-03-10 MED ORDER — HYDROCHLOROTHIAZIDE 25 MG PO TABS: 25.0000 mg | ORAL_TABLET | Freq: Every day | ORAL | Status: DC

## 2011-03-10 MED ORDER — ESOMEPRAZOLE MAGNESIUM 40 MG PO CPDR: DELAYED_RELEASE_CAPSULE | ORAL | Status: DC

## 2011-03-10 NOTE — Telephone Encounter (Signed)
Addended by: Romualdo Bolk on: 03/10/2011 11:03 AM   Modules accepted: Orders

## 2011-03-10 NOTE — Telephone Encounter (Signed)
[facility] to[facility]l, crestor, hctz, and nexium. Pt has appt with Dr. Venetia Maxon on Monday. I advised pt to call Dr. Fredrich Birks office and to see if they can do the rx.

## 2011-03-13 ENCOUNTER — Other Ambulatory Visit: Payer: Self-pay | Admitting: Neurosurgery

## 2011-03-13 ENCOUNTER — Ambulatory Visit
Admission: RE | Admit: 2011-03-13 | Discharge: 2011-03-13 | Disposition: A | Discharge status: Home / Self Care | Payer: Medicare Other | Source: Ambulatory Visit | Attending: Neurosurgery | Admitting: Neurosurgery

## 2011-03-13 DIAGNOSIS — M47817 Spondylosis without myelopathy or radiculopathy, lumbosacral region: Secondary | ICD-10-CM | POA: Diagnosis not present

## 2011-03-13 DIAGNOSIS — R29898 Other symptoms and signs involving the musculoskeletal system: Secondary | ICD-10-CM

## 2011-03-13 DIAGNOSIS — M545 Low back pain, unspecified: Secondary | ICD-10-CM | POA: Diagnosis not present

## 2011-03-13 DIAGNOSIS — M5137 Other intervertebral disc degeneration, lumbosacral region: Secondary | ICD-10-CM | POA: Diagnosis not present

## 2011-03-13 DIAGNOSIS — M4310 Spondylolisthesis, site unspecified: Secondary | ICD-10-CM | POA: Diagnosis not present

## 2011-03-13 DIAGNOSIS — IMO0002 Reserved for concepts with insufficient information to code with codable children: Secondary | ICD-10-CM | POA: Diagnosis not present

## 2011-03-13 DIAGNOSIS — M5126 Other intervertebral disc displacement, lumbar region: Secondary | ICD-10-CM | POA: Diagnosis not present

## 2011-03-15 ENCOUNTER — Other Ambulatory Visit: Payer: Self-pay | Admitting: Neurosurgery

## 2011-03-15 ENCOUNTER — Encounter (HOSPITAL_COMMUNITY): Payer: Self-pay | Admitting: Pharmacy Technician

## 2011-03-15 DIAGNOSIS — M4310 Spondylolisthesis, site unspecified: Secondary | ICD-10-CM | POA: Diagnosis not present

## 2011-03-20 ENCOUNTER — Other Ambulatory Visit (HOSPITAL_COMMUNITY): Payer: Self-pay | Admitting: *Deleted

## 2011-03-21 ENCOUNTER — Encounter (HOSPITAL_COMMUNITY)
Admission: RE | Admit: 2011-03-21 | Discharge: 2011-03-21 | Disposition: A | Discharge status: Home / Self Care | Payer: Medicare Other | Source: Ambulatory Visit | Attending: Neurosurgery | Admitting: Neurosurgery

## 2011-03-21 ENCOUNTER — Encounter (HOSPITAL_COMMUNITY)
Admission: RE | Admit: 2011-03-21 | Discharge: 2011-03-21 | Disposition: A | Discharge status: Home / Self Care | Payer: Medicare Other | Source: Ambulatory Visit | Attending: Anesthesiology | Admitting: Anesthesiology

## 2011-03-21 ENCOUNTER — Other Ambulatory Visit: Payer: Self-pay

## 2011-03-21 ENCOUNTER — Encounter (HOSPITAL_COMMUNITY): Payer: Self-pay

## 2011-03-21 DIAGNOSIS — Z01811 Encounter for preprocedural respiratory examination: Secondary | ICD-10-CM | POA: Diagnosis not present

## 2011-03-21 DIAGNOSIS — Z01810 Encounter for preprocedural cardiovascular examination: Secondary | ICD-10-CM | POA: Diagnosis not present

## 2011-03-21 DIAGNOSIS — M47817 Spondylosis without myelopathy or radiculopathy, lumbosacral region: Secondary | ICD-10-CM | POA: Diagnosis not present

## 2011-03-21 DIAGNOSIS — I10 Essential (primary) hypertension: Secondary | ICD-10-CM | POA: Diagnosis not present

## 2011-03-21 DIAGNOSIS — K219 Gastro-esophageal reflux disease without esophagitis: Secondary | ICD-10-CM | POA: Diagnosis not present

## 2011-03-21 DIAGNOSIS — Z01812 Encounter for preprocedural laboratory examination: Secondary | ICD-10-CM | POA: Diagnosis not present

## 2011-03-21 DIAGNOSIS — M5126 Other intervertebral disc displacement, lumbar region: Secondary | ICD-10-CM | POA: Diagnosis not present

## 2011-03-21 DIAGNOSIS — Z01818 Encounter for other preprocedural examination: Secondary | ICD-10-CM | POA: Diagnosis not present

## 2011-03-21 HISTORY — DX: Malignant (primary) neoplasm, unspecified: C80.1

## 2011-03-21 HISTORY — DX: Shortness of breath: R06.02

## 2011-03-21 HISTORY — DX: Essential (primary) hypertension: I10

## 2011-03-21 HISTORY — DX: Urinary tract infection, site not specified: N39.0

## 2011-03-21 LAB — COMPREHENSIVE METABOLIC PANEL
ALT: 24 U/L (ref 0–35)
AST: 19 U/L (ref 0–37)
Albumin: 3.9 g/dL (ref 3.5–5.2)
Alkaline Phosphatase: 59 U/L (ref 39–117)
CO2: 31 mEq/L (ref 19–32)
Chloride: 99 mEq/L (ref 96–112)
GFR calc non Af Amer: >90 mL/min (ref >90)
Potassium: 3 mEq/L — ABNORMAL LOW (ref 3.5–5.1)
Sodium: 140 mEq/L (ref 135–145)
Total Bilirubin: 0.3 mg/dL (ref 0.3–1.2)

## 2011-03-21 LAB — CBC
HCT: 43 % (ref 36.0–46.0)
Hemoglobin: 14.2 g/dL (ref 12.0–15.0)
MCH: 28.3 pg (ref 26.0–34.0)
MCHC: 33 g/dL (ref 30.0–36.0)

## 2011-03-21 NOTE — Progress Notes (Signed)
Chart left for anesthesia to check

## 2011-03-21 NOTE — Pre-Procedure Instructions (Addendum)
20 SCOTLYNN NOYES  03/21/2011   Your procedure is scheduled on: 2.28.13  Report to [facility] [id number]* pm.  Call this number if you have problems the morning of surgery: [phone]   Remember:   Do not eat food:After Midnight.  May have clear liquids: up to 4 Hours before arrival.  Clear liquids include soda, tea, black coffee, apple or grape juice, broth.  Take these medicines the morning of surgery with A SIP OF WATER:zyrtec, nexium,dilaudid, toprol  STOP aspirin, nsaids,herbal meds, blood thunners   Do not wear jewelry, make-up or nail polish.  Do not wear lotions, powders, or perfumes. You may wear deodorant.  Do not shave 48 hours prior to surgery.  Do not bring valuables to the hospital.  Contacts, dentures or bridgework may not be worn into surgery.  Leave suitcase in the car. After surgery it may be brought to your room.  For patients admitted to the hospital, checkout time is 11:00 AM the day of discharge.   Patients discharged the day of surgery will not be allowed to drive home.  Name and phone number of your driver: Molly Maduro [phone]  Special Instructions: CHG Shower Use Special Wash: 1/2 bottle night before surgery and 1/2 bottle morning of surgery.   Please read over the following fact sheets that you were given: Pain Booklet, Coughing and Deep Breathing, MRSA Information and Surgical Site Infection Prevention

## 2011-03-22 MED ORDER — CEFAZOLIN SODIUM-DEXTROSE 2-3 GM-% IV SOLR
2.0000 g | INTRAVENOUS | Status: AC
  Administered 2011-03-23: 2 g via INTRAVENOUS
  Filled 2011-03-22: qty 50

## 2011-03-22 NOTE — Consult Note (Signed)
Anesthesiology chart review:  [name] ECG was reviewed does not appear significantly changed from an old tracing on 11/20/2001` there is no noted history of coronary disease or significant risk factors.  Kipp Brood M.D.

## 2011-03-22 NOTE — H&P (Signed)
NEUROSURGICAL CONSULTATION  Madison Erickson  #[id number]  DOB:  1941-01-14      March 13, 2011   HISTORY OF PRESENT ILLNESS:  Madison Erickson is a 71 year old, retired woman with low back pain.  She complains of pain going into her left leg, into her lower leg and says she has had a lot history of low back pain, but that this has gotten significantly worse as of the 16th of January.  She was trying on pants on a beach trip in January and has had pain ever since then.  She has been using bedrest and heating pad for 2 weeks.  She has been taking Dilaudid 4 mg qid, prednisone 5-day taper and Motrin prn and Vicodin which is an old prescription for knee surgery and she says none of these have really helped here.  She engaged in physical therapy in the past for back injury in 2008.  She currently describes an electric shock like feeling down her left leg to her shin.    REVIEW OF SYSTEMS:   Review of Systems was reviewed with the patient.  Pertinent positives include wears glasses, cataracts, high blood pressure, leg pain while walking, fatty liver, blood in urine, bladder polyps, back pain, leg pain, arthritis.    PAST MEDICAL HISTORY:    . Current Medical Conditions:  She has a history of fatty liver, ostearthritis, asthma, gastroesophageal reflux disease, kidney disease with left kidney benign tumor, nephritis at childhood, bladder polyps and hematuria.    . Prior Operations and Hospitalizations:  Right low lung lobectomy for bronchiectasis, appendectomy in 1969, C-section deliveries x 3 in 1965, 1970 and 1974, hysterectomy, right and left knee scope, right in 07/2010 and left in 06/2009.    . Medications and Allergies:  SHE IS ALLERGIC TO SULFA.  Current medications include Nexium 40 mg qd, Hydrochlorothiazide 25 mg qd, Toprol-XL 50 mg qd, Crestor 10 mg qd, Aller Tec 10 mg qd, Clobetasol Cream .05% prn.    [name] Height and Weight:  She is currently 5', 3" tall and 160 lbs.    FAMILY HISTORY:    Mother and father  are deceased with heart disease and lung cancer.     SOCIAL HISTORY:    She is a social drinker of alcoholic beverages, no history of substance abuse, no history of tobacco use.    DIAGNOSTIC STUDIES:   Plain radiographs were obtained of the lumbar spine which show left levoconvex scoliosis centered at L3-4 and L4-5 levels with some mild spondylolisthesis of L4 on L5.    PHYSICAL EXAMINATION:    . General Appearance:  [name] is a pleasant, cooperative woman in obvious discomfort.    . Blood Pressure, Pulse and Respiratory Rate:  Blood pressure is 142/82.  Heart rate is 74 and regular.  Respiratory rate is 18.    [name] HEENT - normocephalic, atraumatic.  The pupils are equal, round and reactive to light.  The extraocular muscles are intact.  Sclerae - white.  Conjunctiva - pink.  Oropharynx benign.  Uvula midline.   . Neck - there are no masses, meningismus, deformities, tracheal deviation, jugular vein distention or carotid bruits.  There is normal cervical range of motion.  Spurlings' test is negative without reproducible radicular pain turning the patient's head to either side.  Lhermitte's sign is not present with axial compression.    [name] Respiratory - there is normal respiratory effort with good intercostal function.  Lungs are clear to auscultation.  There are no rales,  rhonchi or wheezes.    . Cardiovascular - the heart has regular rate and rhythm to auscultation.  No murmurs are appreciated.  There is no extremity edema, cyanosis or clubbing.  There are palpable pedal pulses.    . Abdomen - soft, nontender, no hepatosplenomegaly appreciated or masses.  There are active bowel sounds.  No guarding or rebound.    . Musculoskeletal Examination - she hobbles when she walks, barely able to stand on her left leg.  She complains of pain in her left buttock going to her left shin.  She can't extend her spine secondary to severe discomfort.  She walks with an antalgic gait favoring her left lower  extremity.  She is able to stand on her heels and toes.  She has a straight leg raise which is positive at 30 degrees on the left.  She is able to bend to touch her toes.  She has negative Patrick's test.    NEUROLOGICAL EXAMINATION: The patient is oriented to time, person and place and has good recall of both recent and remote memory with normal attention span and concentration.     The patient speaks with clear and fluent speech and exhibits normal language function and appropriate fund of knowledge.    . Cranial Nerve Examination - pupils are equal, round and reactive to light.  Extraocular movements are full.  Visual fields are full to confrontational testing.  Facial sensation and facial movement are symmetric and intact.  Hearing is intact to finger rub.  Palate is upgoing.  Shoulder shrug is symmetric.  Tongue protrudes in the midline.    . Motor Examination - motor strength is 5/5 in the bilateral deltoids, biceps, triceps, handgrips, wrist extensors, interosseous.  In the lower extremities motor strength is 5/5 with the exception of 4/5 left dorsiflexion and EHL strength and 4/5 left hip abductor strength.    . Sensory Examination - she has decreased pin sensation in an S1 distribution on the left.    . Deep Tendon Reflexes - 2 in the biceps, triceps and brachioradialis, 2 at the knees, 2 at the ankles and great toes are downgoing to plantar stimulation.    . Cerebellar Examination - normal coordination in upper and lower extremities and normal rapid alternating movements.  Romberg test is negative.    IMPRESSION AND RECOMMENDATIONS:   [name] is a 71 year old woman with severe left leg pain with an L5 radiculopathy.  I have recommended she get an MRI and I have given her a prescription for Dilaudid #20.  We ordered the MRI on an expedited basis and plan on seeing her back as soon as that is done.    [facility]    Danae Orleans. Venetia Maxon,  M.D.  JDS:sv cc: Dr. Berniece Andreas

## 2011-03-23 ENCOUNTER — Inpatient Hospital Stay (HOSPITAL_COMMUNITY): Payer: Medicare Other

## 2011-03-23 ENCOUNTER — Encounter (HOSPITAL_COMMUNITY): Payer: Self-pay | Admitting: Anesthesiology

## 2011-03-23 ENCOUNTER — Encounter (HOSPITAL_COMMUNITY)
Admission: RE | Disposition: A | Discharge status: Home / Self Care | Payer: Self-pay | Source: Ambulatory Visit | Attending: Neurosurgery

## 2011-03-23 ENCOUNTER — Inpatient Hospital Stay (HOSPITAL_COMMUNITY): Payer: Medicare Other | Admitting: Anesthesiology

## 2011-03-23 ENCOUNTER — Ambulatory Visit (HOSPITAL_COMMUNITY)
Admission: RE | Admit: 2011-03-23 | Discharge: 2011-03-24 | Disposition: A | Discharge status: Home / Self Care | Payer: Medicare Other | Source: Ambulatory Visit | Attending: Neurosurgery | Admitting: Neurosurgery

## 2011-03-23 ENCOUNTER — Encounter (HOSPITAL_COMMUNITY): Payer: Self-pay | Admitting: Surgery

## 2011-03-23 DIAGNOSIS — Z01812 Encounter for preprocedural laboratory examination: Secondary | ICD-10-CM | POA: Insufficient documentation

## 2011-03-23 DIAGNOSIS — Z01818 Encounter for other preprocedural examination: Secondary | ICD-10-CM | POA: Insufficient documentation

## 2011-03-23 DIAGNOSIS — Z981 Arthrodesis status: Secondary | ICD-10-CM | POA: Diagnosis not present

## 2011-03-23 DIAGNOSIS — M47817 Spondylosis without myelopathy or radiculopathy, lumbosacral region: Secondary | ICD-10-CM | POA: Insufficient documentation

## 2011-03-23 DIAGNOSIS — M48061 Spinal stenosis, lumbar region without neurogenic claudication: Secondary | ICD-10-CM | POA: Diagnosis not present

## 2011-03-23 DIAGNOSIS — K219 Gastro-esophageal reflux disease without esophagitis: Secondary | ICD-10-CM | POA: Diagnosis not present

## 2011-03-23 DIAGNOSIS — M5126 Other intervertebral disc displacement, lumbar region: Principal | ICD-10-CM | POA: Insufficient documentation

## 2011-03-23 DIAGNOSIS — I10 Essential (primary) hypertension: Secondary | ICD-10-CM | POA: Insufficient documentation

## 2011-03-23 DIAGNOSIS — Z01810 Encounter for preprocedural cardiovascular examination: Secondary | ICD-10-CM | POA: Insufficient documentation

## 2011-03-23 DIAGNOSIS — R0602 Shortness of breath: Secondary | ICD-10-CM | POA: Diagnosis not present

## 2011-03-23 HISTORY — PX: LUMBAR LAMINECTOMY/DECOMPRESSION MICRODISCECTOMY: SHX5026

## 2011-03-23 HISTORY — DX: Encounter for other specified aftercare: Z51.89

## 2011-03-23 SURGERY — LUMBAR LAMINECTOMY/DECOMPRESSION MICRODISCECTOMY 1 LEVEL
Anesthesia: General | Site: Back | Laterality: Left | Wound class: Clean

## 2011-03-23 MED ORDER — MEPERIDINE HCL 25 MG/ML IJ SOLN: 6.2500 mg | INTRAMUSCULAR | Status: DC | PRN

## 2011-03-23 MED ORDER — PROPOFOL 10 MG/ML IV EMUL
INTRAVENOUS | Status: DC | PRN
  Administered 2011-03-23: 130 mg via INTRAVENOUS

## 2011-03-23 MED ORDER — BACITRACIN 50000 UNITS IM SOLR
INTRAMUSCULAR | Status: AC
  Filled 2011-03-23: qty 1

## 2011-03-23 MED ORDER — KETOROLAC TROMETHAMINE 30 MG/ML IJ SOLN
30.0000 mg | Freq: Four times a day (QID) | INTRAMUSCULAR | Status: DC
  Administered 2011-03-24 (×2): 30 mg via INTRAVENOUS
  Filled 2011-03-23 (×7): qty 1

## 2011-03-23 MED ORDER — ONDANSETRON HCL 4 MG/2ML IJ SOLN: 4.0000 mg | Freq: Once | INTRAMUSCULAR | Status: DC | PRN

## 2011-03-23 MED ORDER — SODIUM CHLORIDE 0.9 % IJ SOLN: 3.0000 mL | INTRAMUSCULAR | Status: DC | PRN

## 2011-03-23 MED ORDER — MENTHOL 3 MG MT LOZG: 1.0000 | LOZENGE | OROMUCOSAL | Status: DC | PRN

## 2011-03-23 MED ORDER — BUPIVACAINE HCL (PF) 0.5 % IJ SOLN
INTRAMUSCULAR | Status: DC | PRN
  Administered 2011-03-23: 4 mL

## 2011-03-23 MED ORDER — HYDROMORPHONE HCL PF 1 MG/ML IJ SOLN
INTRAMUSCULAR | Status: AC
  Filled 2011-03-23: qty 1

## 2011-03-23 MED ORDER — ZOLPIDEM TARTRATE 5 MG PO TABS: 5.0000 mg | ORAL_TABLET | Freq: Every evening | ORAL | Status: DC | PRN

## 2011-03-23 MED ORDER — SODIUM CHLORIDE 0.9 % IV SOLN: 250.0000 mL | INTRAVENOUS | Status: DC

## 2011-03-23 MED ORDER — THROMBIN 5000 UNITS EX KIT
PACK | CUTANEOUS | Status: DC | PRN
  Administered 2011-03-23 (×2): 5000 [IU] via TOPICAL

## 2011-03-23 MED ORDER — ACETAMINOPHEN 325 MG PO TABS: 650.0000 mg | ORAL_TABLET | ORAL | Status: DC | PRN

## 2011-03-23 MED ORDER — 0.9 % SODIUM CHLORIDE (POUR BTL) OPTIME
TOPICAL | Status: DC | PRN
  Administered 2011-03-23: 1000 mL

## 2011-03-23 MED ORDER — SODIUM CHLORIDE 0.9 % IR SOLN
Status: DC | PRN
  Administered 2011-03-23: 16:00:00

## 2011-03-23 MED ORDER — OXYCODONE-ACETAMINOPHEN 5-325 MG PO TABS: 1.0000 | ORAL_TABLET | ORAL | Status: DC | PRN

## 2011-03-23 MED ORDER — ONDANSETRON HCL 4 MG/2ML IJ SOLN: 4.0000 mg | INTRAMUSCULAR | Status: DC | PRN

## 2011-03-23 MED ORDER — SODIUM CHLORIDE 0.9 % IV SOLN
INTRAVENOUS | Status: AC
  Filled 2011-03-23: qty 500

## 2011-03-23 MED ORDER — METOPROLOL SUCCINATE ER 50 MG PO TB24
50.0000 mg | ORAL_TABLET | Freq: Every day | ORAL | Status: DC
  Filled 2011-03-23 (×2): qty 1

## 2011-03-23 MED ORDER — ALUM & MAG HYDROXIDE-SIMETH 200-200-20 MG/5ML PO SUSP: 30.0000 mL | Freq: Four times a day (QID) | ORAL | Status: DC | PRN

## 2011-03-23 MED ORDER — LORATADINE 10 MG PO TABS
10.0000 mg | ORAL_TABLET | Freq: Every day | ORAL | Status: DC
  Filled 2011-03-23 (×2): qty 1

## 2011-03-23 MED ORDER — FENTANYL CITRATE 0.05 MG/ML IJ SOLN
INTRAMUSCULAR | Status: DC | PRN
  Administered 2011-03-23: 150 ug via INTRAVENOUS

## 2011-03-23 MED ORDER — SODIUM CHLORIDE 0.9 % IJ SOLN
3.0000 mL | Freq: Two times a day (BID) | INTRAMUSCULAR | Status: DC
  Administered 2011-03-23: 3 mL via INTRAVENOUS

## 2011-03-23 MED ORDER — FENTANYL CITRATE 0.05 MG/ML IJ SOLN
INTRAMUSCULAR | Status: AC
  Filled 2011-03-23: qty 2

## 2011-03-23 MED ORDER — HYDROCHLOROTHIAZIDE 25 MG PO TABS
25.0000 mg | ORAL_TABLET | Freq: Every day | ORAL | Status: DC
  Filled 2011-03-23 (×2): qty 1

## 2011-03-23 MED ORDER — HYDROMORPHONE HCL 2 MG PO TABS: 2.0000 mg | ORAL_TABLET | Freq: Four times a day (QID) | ORAL | Status: DC | PRN

## 2011-03-23 MED ORDER — ROCURONIUM BROMIDE 100 MG/10ML IV SOLN
INTRAVENOUS | Status: DC | PRN
  Administered 2011-03-23: 50 mg via INTRAVENOUS

## 2011-03-23 MED ORDER — HYDROMORPHONE HCL PF 1 MG/ML IJ SOLN: 0.5000 mg | INTRAMUSCULAR | Status: DC | PRN

## 2011-03-23 MED ORDER — FENTANYL CITRATE 0.05 MG/ML IJ SOLN
INTRAMUSCULAR | Status: DC | PRN
  Administered 2011-03-23: 100 ug via INTRAVENOUS

## 2011-03-23 MED ORDER — KETOROLAC TROMETHAMINE 30 MG/ML IJ SOLN
30.0000 mg | Freq: Once | INTRAMUSCULAR | Status: AC
  Administered 2011-03-23: 30 mg via INTRAVENOUS

## 2011-03-23 MED ORDER — ACETAMINOPHEN 650 MG RE SUPP: 650.0000 mg | RECTAL | Status: DC | PRN

## 2011-03-23 MED ORDER — PANTOPRAZOLE SODIUM 40 MG PO TBEC: 40.0000 mg | DELAYED_RELEASE_TABLET | Freq: Every day | ORAL | Status: DC

## 2011-03-23 MED ORDER — MORPHINE SULFATE 2 MG/ML IJ SOLN: 0.0500 mg/kg | INTRAMUSCULAR | Status: DC | PRN

## 2011-03-23 MED ORDER — HEMOSTATIC AGENTS (NO CHARGE) OPTIME
TOPICAL | Status: DC | PRN
  Administered 2011-03-23: 1 via TOPICAL

## 2011-03-23 MED ORDER — LIDOCAINE HCL (CARDIAC) 20 MG/ML IV SOLN
INTRAVENOUS | Status: DC | PRN
  Administered 2011-03-23: 100 mg via INTRAVENOUS

## 2011-03-23 MED ORDER — MIDAZOLAM HCL 5 MG/5ML IJ SOLN
INTRAMUSCULAR | Status: DC | PRN
  Administered 2011-03-23: 2 mg via INTRAVENOUS

## 2011-03-23 MED ORDER — DOCUSATE SODIUM 100 MG PO CAPS
100.0000 mg | ORAL_CAPSULE | Freq: Two times a day (BID) | ORAL | Status: DC
  Administered 2011-03-23: 100 mg via ORAL
  Filled 2011-03-23: qty 1

## 2011-03-23 MED ORDER — DIAZEPAM 5 MG PO TABS: 5.0000 mg | ORAL_TABLET | Freq: Four times a day (QID) | ORAL | Status: DC | PRN

## 2011-03-23 MED ORDER — HYDROMORPHONE HCL 2 MG PO TABS: 4.0000 mg | ORAL_TABLET | Freq: Four times a day (QID) | ORAL | Status: DC | PRN

## 2011-03-23 MED ORDER — ATORVASTATIN CALCIUM 10 MG PO TABS: 10.0000 mg | ORAL_TABLET | Freq: Every day | ORAL | Status: DC

## 2011-03-23 MED ORDER — LIDOCAINE-EPINEPHRINE 1 %-1:100000 IJ SOLN
INTRAMUSCULAR | Status: DC | PRN
  Administered 2011-03-23: 4 mL

## 2011-03-23 MED ORDER — PHENOL 1.4 % MT LIQD: 1.0000 | OROMUCOSAL | Status: DC | PRN

## 2011-03-23 MED ORDER — ROSUVASTATIN CALCIUM 10 MG PO TABS
10.0000 mg | ORAL_TABLET | Freq: Every day | ORAL | Status: DC
  Filled 2011-03-23 (×2): qty 1

## 2011-03-23 MED ORDER — CLOBETASOL PROPIONATE 0.05 % EX CREA: 1.0000 "application " | TOPICAL_CREAM | Freq: Two times a day (BID) | CUTANEOUS | Status: DC | PRN

## 2011-03-23 MED ORDER — METHYLPREDNISOLONE ACETATE 80 MG/ML IJ SUSP
INTRAMUSCULAR | Status: DC | PRN
  Administered 2011-03-23: 80 mg

## 2011-03-23 MED ORDER — KCL IN DEXTROSE-NACL 20-5-0.45 MEQ/L-%-% IV SOLN
INTRAVENOUS | Status: DC
  Filled 2011-03-23 (×3): qty 1000

## 2011-03-23 MED ORDER — HYDROMORPHONE HCL PF 1 MG/ML IJ SOLN
0.2500 mg | INTRAMUSCULAR | Status: DC | PRN
  Administered 2011-03-23 (×2): 0.5 mg via INTRAVENOUS

## 2011-03-23 MED ORDER — CEFAZOLIN SODIUM 1-5 GM-% IV SOLN
1.0000 g | Freq: Three times a day (TID) | INTRAVENOUS | Status: AC
  Administered 2011-03-23 – 2011-03-24 (×2): 1 g via INTRAVENOUS
  Filled 2011-03-23 (×2): qty 50

## 2011-03-23 MED ORDER — LACTATED RINGERS IV SOLN
INTRAVENOUS | Status: DC | PRN
  Administered 2011-03-23 (×2): via INTRAVENOUS

## 2011-03-23 SURGICAL SUPPLY — 66 items
ADH SKN CLS APL DERMABOND .7 (GAUZE/BANDAGES/DRESSINGS) ×1
APL SKNCLS STERI-STRIP NONHPOA (GAUZE/BANDAGES/DRESSINGS)
BAG DECANTER FOR FLEXI CONT (MISCELLANEOUS) ×2 IMPLANT
BENZOIN TINCTURE PRP APPL 2/3 (GAUZE/BANDAGES/DRESSINGS) IMPLANT
BIT DRILL NEURO 2X3.1 SFT TUCH (MISCELLANEOUS) ×1 IMPLANT
BLADE SURG ROTATE 9660 (MISCELLANEOUS) IMPLANT
BUR ROUND FLUTED 5 RND (BURR) ×2 IMPLANT
CANISTER SUCTION 2500CC (MISCELLANEOUS) ×2 IMPLANT
CLOTH BEACON ORANGE TIMEOUT ST (SAFETY) ×2 IMPLANT
CONT SPEC 4OZ CLIKSEAL STRL BL (MISCELLANEOUS) ×2 IMPLANT
DERMABOND ADVANCED (GAUZE/BANDAGES/DRESSINGS) ×1
DERMABOND ADVANCED .7 DNX12 (GAUZE/BANDAGES/DRESSINGS) ×1 IMPLANT
DRAPE LAPAROTOMY 100X72X124 (DRAPES) ×2 IMPLANT
DRAPE MICROSCOPE LEICA (MISCELLANEOUS) ×2 IMPLANT
DRAPE POUCH INSTRU U-SHP 10X18 (DRAPES) ×2 IMPLANT
DRAPE SURG 17X23 STRL (DRAPES) ×2 IMPLANT
DRESSING TELFA 8X3 (GAUZE/BANDAGES/DRESSINGS) IMPLANT
DRILL NEURO 2X3.1 SOFT TOUCH (MISCELLANEOUS) ×2
DURAPREP 26ML APPLICATOR (WOUND CARE) ×2 IMPLANT
ELECT REM PT RETURN 9FT ADLT (ELECTROSURGICAL) ×2
ELECTRODE REM PT RTRN 9FT ADLT (ELECTROSURGICAL) ×1 IMPLANT
GAUZE SPONGE 4X4 16PLY XRAY LF (GAUZE/BANDAGES/DRESSINGS) IMPLANT
GLOVE BIO SURGEON STRL SZ 6.5 (GLOVE) ×3 IMPLANT
GLOVE BIO SURGEON STRL SZ8 (GLOVE) ×2 IMPLANT
GLOVE BIOGEL PI IND STRL 6.5 (GLOVE) IMPLANT
GLOVE BIOGEL PI IND STRL 7.0 (GLOVE) IMPLANT
GLOVE BIOGEL PI IND STRL 8 (GLOVE) ×1 IMPLANT
GLOVE BIOGEL PI IND STRL 8.5 (GLOVE) ×1 IMPLANT
GLOVE BIOGEL PI INDICATOR 6.5 (GLOVE) ×1
GLOVE BIOGEL PI INDICATOR 7.0 (GLOVE) ×1
GLOVE BIOGEL PI INDICATOR 8 (GLOVE) ×1
GLOVE BIOGEL PI INDICATOR 8.5 (GLOVE) ×1
GLOVE ECLIPSE 7.5 STRL STRAW (GLOVE) ×1 IMPLANT
GLOVE ECLIPSE 8.0 STRL XLNG CF (GLOVE) ×2 IMPLANT
GLOVE EXAM NITRILE LRG STRL (GLOVE) IMPLANT
GLOVE EXAM NITRILE MD LF STRL (GLOVE) IMPLANT
GLOVE EXAM NITRILE XL STR (GLOVE) IMPLANT
GLOVE EXAM NITRILE XS STR PU (GLOVE) IMPLANT
GLOVE SS BIOGEL STRL SZ 6.5 (GLOVE) IMPLANT
GLOVE SUPERSENSE BIOGEL SZ 6.5 (GLOVE) ×1
GOWN BRE IMP SLV AUR LG STRL (GOWN DISPOSABLE) ×2 IMPLANT
GOWN BRE IMP SLV AUR XL STRL (GOWN DISPOSABLE) ×3 IMPLANT
GOWN STRL REIN 2XL LVL4 (GOWN DISPOSABLE) ×1 IMPLANT
KIT BASIN OR (CUSTOM PROCEDURE TRAY) ×2 IMPLANT
KIT ROOM TURNOVER OR (KITS) ×2 IMPLANT
NDL HYPO 18GX1.5 BLUNT FILL (NEEDLE) IMPLANT
NDL HYPO 25X1 1.5 SAFETY (NEEDLE) ×1 IMPLANT
NEEDLE HYPO 18GX1.5 BLUNT FILL (NEEDLE) IMPLANT
NEEDLE HYPO 25X1 1.5 SAFETY (NEEDLE) ×2 IMPLANT
NS IRRIG 1000ML POUR BTL (IV SOLUTION) ×2 IMPLANT
PACK LAMINECTOMY NEURO (CUSTOM PROCEDURE TRAY) ×2 IMPLANT
PAD ARMBOARD 7.5X6 YLW CONV (MISCELLANEOUS) ×6 IMPLANT
RUBBERBAND STERILE (MISCELLANEOUS) ×4 IMPLANT
SPONGE GAUZE 4X4 12PLY (GAUZE/BANDAGES/DRESSINGS) IMPLANT
SPONGE SURGIFOAM ABS GEL SZ50 (HEMOSTASIS) ×2 IMPLANT
STAPLER SKIN PROX WIDE 3.9 (STAPLE) IMPLANT
STRIP CLOSURE SKIN 1/2X4 (GAUZE/BANDAGES/DRESSINGS) IMPLANT
SUT VIC AB 0 CT1 18XCR BRD8 (SUTURE) ×1 IMPLANT
SUT VIC AB 0 CT1 8-18 (SUTURE) ×2
SUT VIC AB 2-0 CT1 18 (SUTURE) ×2 IMPLANT
SUT VIC AB 3-0 SH 8-18 (SUTURE) ×2 IMPLANT
SYR 20ML ECCENTRIC (SYRINGE) ×2 IMPLANT
SYR 5ML LL (SYRINGE) IMPLANT
TOWEL OR 17X24 6PK STRL BLUE (TOWEL DISPOSABLE) ×2 IMPLANT
TOWEL OR 17X26 10 PK STRL BLUE (TOWEL DISPOSABLE) ×2 IMPLANT
WATER STERILE IRR 1000ML POUR (IV SOLUTION) ×2 IMPLANT

## 2011-03-23 NOTE — Transfer of Care (Signed)
Immediate Anesthesia Transfer of Care Note  Patient: Madison Erickson  Procedure(s) Performed: Procedure(s) (LRB): LUMBAR LAMINECTOMY/DECOMPRESSION MICRODISCECTOMY 1 LEVEL (Left)  Patient Location: PACU  Anesthesia Type: General  Level of Consciousness: awake, oriented and sedated  Airway & Oxygen Therapy: Patient Spontanous Breathing and Patient connected to nasal cannula oxygen  Post-op Assessment: Report given to PACU RN, Post -op Vital signs reviewed and stable and Patient moving all extremities  Post vital signs: Reviewed and stable  Complications: No apparent anesthesia complications

## 2011-03-23 NOTE — Anesthesia Preprocedure Evaluation (Signed)
Anesthesia Evaluation  Patient identified by MRN, date of birth, ID band Patient awake    Reviewed: Allergy & Precautions, H&P , NPO status , Patient's Chart, lab work & pertinent test results  Airway Mallampati: II TM Distance: >3 FB Neck ROM: Full    Dental   Pulmonary          Cardiovascular hypertension, Pt. on medications and Pt. on home beta blockers     Neuro/Psych    GI/Hepatic GERD-  Medicated and Controlled,  Endo/Other    Renal/GU      Musculoskeletal   Abdominal   Peds  Hematology   Anesthesia Other Findings   Reproductive/Obstetrics                           Anesthesia Physical Anesthesia Plan  ASA: II  Anesthesia Plan: General   Post-op Pain Management:    Induction: Intravenous  Airway Management Planned: Oral ETT  Additional Equipment:   Intra-op Plan:   Post-operative Plan: Extubation in OR  Informed Consent: I have reviewed the patients History and Physical, chart, labs and discussed the procedure including the risks, benefits and alternatives for the proposed anesthesia with the patient or authorized representative who has indicated his/her understanding and acceptance.     Plan Discussed with: CRNA and Surgeon  Anesthesia Plan Comments:         Anesthesia Quick Evaluation

## 2011-03-23 NOTE — Progress Notes (Signed)
Report given to Angel RN.

## 2011-03-23 NOTE — Interval H&P Note (Signed)
History and Physical Interval Note:  03/23/2011 11:57 AM  Madison Erickson  has presented today for surgery, with the diagnosis of lumbar stenosis lumbar herniated disc  The various methods of treatment have been discussed with the patient and family. After consideration of risks, benefits and other options for treatment, the patient has consented to  Procedure(s) (LRB): LUMBAR LAMINECTOMY/DECOMPRESSION MICRODISCECTOMY 1 LEVEL (Left) as a surgical intervention .  The patients' history has been reviewed, patient examined, no change in status, stable for surgery.  I have reviewed the patients' chart and labs.  Questions were answered to the patient's satisfaction.     [name]  Date of Initial H&P:03/13/2011  History reviewed, patient examined, no change in status, stable for surgery.

## 2011-03-23 NOTE — Op Note (Signed)
03/23/2011  4:33 PM  PATIENT:  Madison Erickson  71 y.o. female  PRE-OPERATIVE DIAGNOSIS: Left Lumbar four-five stenosis, herniated nucleus pulposus, spondylosis, radiculopathy  POST-OPERATIVE DIAGNOSIS: Left Lumbar four-five stenosis, herniated nucleus pulposus, spondylosis, radiculopathy  PROCEDURE:  Procedure(s) (LRB): LUMBAR LAMINECTOMY/DECOMPRESSION MICRODISCECTOMY 1 LEVEL (Left)  SURGEON:  Surgeon(s) and Role:    * Dorian Heckle, MD - Primary    * Clydene Fake, MD - Assisting  PHYSICIAN ASSISTANT:   ASSISTANTS: none   ANESTHESIA:   general  EBL:  Total I/O In: 1100 [I.V.:1100] Out: 50 [Blood:50]  BLOOD ADMINISTERED:none  DRAINS: none   LOCAL MEDICATIONS USED:  LIDOCAINE   SPECIMEN:  No Specimen  DISPOSITION OF SPECIMEN:  N/A  COUNTS:  YES  TOURNIQUET:  * No tourniquets in log *  DICTATION: DICTATION: Patient has a large laterally projecting free fragment of herniated disc at L4-5 on the left with significantleft leg weakness and pain. It was elected to take her to surgery for left L4-5 microdiscectomy and foraminotomy for concomitant stenosis.  Procedure: Patient was brought to the operating room and following the smooth and uncomplicated induction of general endotracheal anesthesia she was placed in a prone position on the Wilson frame. Low back was prepped and draped in the usual sterile fashion with DuraPrep. Area of planned incision was infiltrated with local lidocaine. Incision was made in the midline and carried to the lumbodorsal fascia which was incised on the right side of midline. Subperiosteal dissection was performed exposing what was felt to be L45 level. Intraoperative x-ray demonstrated marker probes at L5/S1 and L4-5.  A hemi-semi-laminectomy of L4 was performed a high-speed drill and completed with Kerrison rongeurs and a generous foraminotomy was performed overlying the superior aspect of the L5 lamina. Ligamentum flavum was detached and removed in a  piecemeal fashion and the L5 nerve root was decompressed laterally with removal of the superior aspect of the facet and ligamentum causing nerve root compression. The microscope was brought into the field and the L5 nerve root was mobilized medially. This exposed a large amount of soft disc material and a free fragment of herniated disc material within the axilla of the L4 nerve root.  Using ball tip probes, we were able to remove two large fragments of herniated disc material and to decompress the L4 nerve root. The interspace itself did not require decompression or discectomy.  At this point it was felt that all neural elements were well decompressed. The laminectomy defect was then irrigated with bacitracin saline. Hemostasis was assured with bipolar electrocautery and the interspace was irrigated with Depo-Medrol and fentanyl. The lumbodorsal fascia was closed with 0 Vicryl sutures the subcutaneous tissues reapproximated 2-0 Vicryl inverted sutures and the skin edges were reapproximated with 3-0 Vicryl subcuticular stitch. The wound was dressed with Dermabond. Patient was extubated in the operating room and taken to recovery in stable and satisfactory condition having tolerated his operation well counts were correct at the end of the case.  PLAN OF CARE: Admit for overnight observation  PATIENT DISPOSITION:  PACU - hemodynamically stable.   Delay start of Pharmacological VTE agent (>24hrs) due to surgical blood loss or risk of bleeding: yes

## 2011-03-23 NOTE — Anesthesia Postprocedure Evaluation (Signed)
  Anesthesia Post-op Note  Patient: Madison Erickson  Procedure(s) Performed: Procedure(s) (LRB): LUMBAR LAMINECTOMY/DECOMPRESSION MICRODISCECTOMY 1 LEVEL (Left)  Patient Location: PACU  Anesthesia Type: General  Level of Consciousness: awake and alert   Airway and Oxygen Therapy: Patient Spontanous Breathing and Patient connected to nasal cannula oxygen  Post-op Pain: mild  Post-op Assessment: Post-op Vital signs reviewed, Patient's Cardiovascular Status Stable, Respiratory Function Stable, Patent Airway and No signs of Nausea or vomiting  Post-op Vital Signs: Reviewed and stable  Complications: No apparent anesthesia complications

## 2011-03-24 NOTE — Discharge Summary (Signed)
Physician Discharge Summary  Patient ID: Madison Erickson MRN: [id number] DOB/AGE: [birth year] 71 y.o.  Admit date: 03/23/2011 Discharge date: 03/24/2011  Admission Diagnoses: Left Lumbar four-five stenosis, herniated nucleus pulposus, spondylosis, radiculopathy   Discharge Diagnoses: Left Lumbar four-five stenosis, herniated nucleus pulposus, spondylosis, radiculopathy s/p LUMBAR LAMINECTOMY/DECOMPRESSION MICRODISCECTOMY 1 LEVEL   Active Problems:  * No active hospital problems. *    Discharged Condition: good  Hospital Course: [name] is a 71 year old, retired woman with low back pain, left leg pain increasing since Jan 16th.  She was admitted and underwent left L4-5 lumbar laminectomy  with microdiscectomy. The surgery was uncomplicated, as was her recovery in PACU. She stayed the night for observation on 3500. Currently, she reports resolution of her pain.   Consults: None  Significant Diagnostic Studies: radiology: X-Ray: intraoperative  Treatments: surgery: LUMBAR LAMINECTOMY/DECOMPRESSION MICRODISCECTOMY 1 LEVEL Left L4-5 level.  Discharge Exam: Blood pressure 118/73, pulse 78, temperature 98.9 F (37.2 C), temperature source Oral, resp. rate 16, SpO2 98.00%. Alert, conversant, without c/o pain or discomfort. Incision with Dermabond, no erythema, swelling, or drainage. Good strength BLE. Pt understands d/c instructions and will call the office to schedule her f/u appt with Dr. Venetia Maxon.  She has Skelaxin at home for prn spasm control. Rx given: Hydrocodone 5/325 1-2po q 6 hrs prn pain #50.  Disposition: 01-Home or Self Care   Medication List  As of 03/24/2011  8:15 AM   ASK your doctor about these medications         cetirizine 10 MG tablet   Commonly known as: ZYRTEC   Take 10 mg by mouth daily.      clobetasol cream 0.05 %   Commonly known as: TEMOVATE   Apply 1 application topically 2 (two) times daily as needed. For irritation of skin        esomeprazole 40 MG capsule     Commonly known as: NEXIUM   Take 40 mg by mouth daily before breakfast.      hydrochlorothiazide 25 MG tablet   Commonly known as: HYDRODIURIL   Take 25 mg by mouth daily.      HYDROmorphone 2 MG tablet   Commonly known as: DILAUDID   Take 2-4 mg by mouth every 6 (six) hours as needed. For pain      HYDROmorphone 4 MG tablet   Commonly known as: DILAUDID   Take 4 mg by mouth every 6 (six) hours as needed. For pain      metoprolol succinate 50 MG 24 hr tablet   Commonly known as: TOPROL-XL   Take 50 mg by mouth daily.      rosuvastatin 10 MG tablet   Commonly known as: CRESTOR   Take 10 mg by mouth daily.             Signed: Georgiann Cocker 03/24/2011, 8:15 AM

## 2011-03-25 ENCOUNTER — Encounter (HOSPITAL_COMMUNITY): Payer: Self-pay | Admitting: Neurosurgery

## 2011-05-08 DIAGNOSIS — H52229 Regular astigmatism, unspecified eye: Secondary | ICD-10-CM | POA: Diagnosis not present

## 2011-05-08 DIAGNOSIS — H2510 Age-related nuclear cataract, unspecified eye: Secondary | ICD-10-CM | POA: Diagnosis not present

## 2011-05-08 DIAGNOSIS — H524 Presbyopia: Secondary | ICD-10-CM | POA: Diagnosis not present

## 2011-05-23 DIAGNOSIS — L821 Other seborrheic keratosis: Secondary | ICD-10-CM | POA: Diagnosis not present

## 2011-05-23 DIAGNOSIS — D239 Other benign neoplasm of skin, unspecified: Secondary | ICD-10-CM | POA: Diagnosis not present

## 2011-05-23 DIAGNOSIS — Z85828 Personal history of other malignant neoplasm of skin: Secondary | ICD-10-CM | POA: Diagnosis not present

## 2011-06-27 ENCOUNTER — Other Ambulatory Visit: Payer: Self-pay | Admitting: Internal Medicine

## 2011-06-27 DIAGNOSIS — Z1231 Encounter for screening mammogram for malignant neoplasm of breast: Secondary | ICD-10-CM

## 2011-07-31 ENCOUNTER — Ambulatory Visit
Admission: RE | Admit: 2011-07-31 | Discharge: 2011-07-31 | Disposition: A | Discharge status: Home / Self Care | Payer: Medicare Other | Source: Ambulatory Visit | Attending: Internal Medicine | Admitting: Internal Medicine

## 2011-07-31 DIAGNOSIS — Z1231 Encounter for screening mammogram for malignant neoplasm of breast: Secondary | ICD-10-CM

## 2011-08-03 ENCOUNTER — Other Ambulatory Visit: Payer: Self-pay | Admitting: Internal Medicine

## 2011-08-03 DIAGNOSIS — R928 Other abnormal and inconclusive findings on diagnostic imaging of breast: Secondary | ICD-10-CM

## 2011-08-04 ENCOUNTER — Ambulatory Visit
Admission: RE | Admit: 2011-08-04 | Discharge: 2011-08-04 | Disposition: A | Discharge status: Home / Self Care | Payer: Medicare Other | Source: Ambulatory Visit | Attending: Internal Medicine | Admitting: Internal Medicine

## 2011-08-04 DIAGNOSIS — R928 Other abnormal and inconclusive findings on diagnostic imaging of breast: Secondary | ICD-10-CM

## 2011-08-07 DIAGNOSIS — M48061 Spinal stenosis, lumbar region without neurogenic claudication: Secondary | ICD-10-CM | POA: Diagnosis not present

## 2011-10-17 ENCOUNTER — Telehealth: Payer: Self-pay | Admitting: Internal Medicine

## 2011-10-17 NOTE — Telephone Encounter (Signed)
Pt called req refills for esomeprazole (NEXIUM) 40 MG capsule,  hydrochlorothiazide (HYDRODIURIL) 25 MG tablet,  metoprolol succinate (TOPROL-XL) 50 MG 24 hr tablet and rosuvastatin (CRESTOR) 10 MG tablet to [facility].

## 2011-10-19 ENCOUNTER — Other Ambulatory Visit: Payer: Self-pay | Admitting: Family Medicine

## 2011-10-19 MED ORDER — METOPROLOL SUCCINATE ER 50 MG PO TB24: 50.0000 mg | ORAL_TABLET | Freq: Every day | ORAL | Status: DC

## 2011-10-19 MED ORDER — ESOMEPRAZOLE MAGNESIUM 40 MG PO CPDR: 40.0000 mg | DELAYED_RELEASE_CAPSULE | Freq: Every day | ORAL | Status: DC

## 2011-10-19 MED ORDER — HYDROCHLOROTHIAZIDE 25 MG PO TABS: 25.0000 mg | ORAL_TABLET | Freq: Every day | ORAL | Status: DC

## 2011-10-19 MED ORDER — ROSUVASTATIN CALCIUM 10 MG PO TABS: 10.0000 mg | ORAL_TABLET | Freq: Every day | ORAL | Status: DC

## 2011-10-19 NOTE — Telephone Encounter (Signed)
Left message for the pt to return my call.  Not seen since 09/06/10 for knee pain.  Needs follow up.

## 2011-10-19 NOTE — Telephone Encounter (Signed)
Medications filled.  Pt scheduled for fu in Oct and CPE in Jan.  Sent #90 of all to [facility].  0 refills.

## 2011-11-08 ENCOUNTER — Encounter: Payer: Self-pay | Admitting: Internal Medicine

## 2011-11-08 ENCOUNTER — Ambulatory Visit (INDEPENDENT_AMBULATORY_CARE_PROVIDER_SITE_OTHER): Payer: Medicare Other | Admitting: Internal Medicine

## 2011-11-08 ENCOUNTER — Ambulatory Visit (INDEPENDENT_AMBULATORY_CARE_PROVIDER_SITE_OTHER)
Admission: RE | Admit: 2011-11-08 | Discharge: 2011-11-08 | Disposition: A | Discharge status: Home / Self Care | Payer: Medicare Other | Source: Ambulatory Visit | Attending: Internal Medicine | Admitting: Internal Medicine

## 2011-11-08 VITALS — BP 136/90 | HR 78 | Temp 98.8°F | Wt 165.0 lb

## 2011-11-08 DIAGNOSIS — J45909 Unspecified asthma, uncomplicated: Secondary | ICD-10-CM

## 2011-11-08 DIAGNOSIS — R062 Wheezing: Secondary | ICD-10-CM

## 2011-11-08 DIAGNOSIS — I10 Essential (primary) hypertension: Secondary | ICD-10-CM

## 2011-11-08 DIAGNOSIS — IMO0002 Reserved for concepts with insufficient information to code with codable children: Secondary | ICD-10-CM

## 2011-11-08 DIAGNOSIS — Z23 Encounter for immunization: Secondary | ICD-10-CM

## 2011-11-08 DIAGNOSIS — E785 Hyperlipidemia, unspecified: Secondary | ICD-10-CM | POA: Diagnosis not present

## 2011-11-08 DIAGNOSIS — J309 Allergic rhinitis, unspecified: Secondary | ICD-10-CM

## 2011-11-08 DIAGNOSIS — K7689 Other specified diseases of liver: Secondary | ICD-10-CM | POA: Diagnosis not present

## 2011-11-08 DIAGNOSIS — J984 Other disorders of lung: Secondary | ICD-10-CM | POA: Diagnosis not present

## 2011-11-08 DIAGNOSIS — J302 Other seasonal allergic rhinitis: Secondary | ICD-10-CM

## 2011-11-08 DIAGNOSIS — R7309 Other abnormal glucose: Secondary | ICD-10-CM

## 2011-11-08 DIAGNOSIS — M1990 Unspecified osteoarthritis, unspecified site: Secondary | ICD-10-CM

## 2011-11-08 LAB — BASIC METABOLIC PANEL
BUN: 13 mg/dL (ref 6–23)
CO2: 31 mEq/L (ref 19–32)
Chloride: 101 mEq/L (ref 96–112)
Creatinine, Ser: 0.6 mg/dL (ref 0.4–1.2)
Potassium: 3.7 mEq/L (ref 3.5–5.1)

## 2011-11-08 LAB — LIPID PANEL
Cholesterol: 143 mg/dL (ref 0–200)
HDL: 30.6 mg/dL — ABNORMAL LOW (ref >39.00)
Triglycerides: 165 mg/dL — ABNORMAL HIGH (ref 0.0–149.0)

## 2011-11-08 LAB — CBC WITH DIFFERENTIAL/PLATELET
Basophils Relative: 0.6 % (ref 0.0–3.0)
Eosinophils Absolute: 0.2 10*3/uL (ref 0.0–0.7)
Eosinophils Relative: 1.7 % (ref 0.0–5.0)
HCT: 43.5 % (ref 36.0–46.0)
Lymphs Abs: 3.2 10*3/uL (ref 0.7–4.0)
MCHC: 32.9 g/dL (ref 30.0–36.0)
MCV: 89.4 fl (ref 78.0–100.0)
Monocytes Absolute: 0.7 10*3/uL (ref 0.1–1.0)
Platelets: 269 10*3/uL (ref 150.0–400.0)
RBC: 4.87 Mil/uL (ref 3.87–5.11)
WBC: 10.6 10*3/uL — ABNORMAL HIGH (ref 4.5–10.5)

## 2011-11-08 LAB — HEPATIC FUNCTION PANEL
ALT: 45 U/L — ABNORMAL HIGH (ref 0–35)
Total Protein: 7.1 g/dL (ref 6.0–8.3)

## 2011-11-08 MED ORDER — PREDNISONE 20 MG PO TABS: ORAL_TABLET | ORAL | Status: DC

## 2011-11-08 MED ORDER — FLUTICASONE PROPIONATE 50 MCG/ACT NA SUSP: NASAL | Status: DC

## 2011-11-08 NOTE — Patient Instructions (Addendum)
You are wheezing today get a chest x-ray begin 5 days of prednisone. It may get better with an inhaler but you are getting side effects of this. You can also take her Nexium every day to see if that helps. Begin Flonase nasal spray for chronic postnasal drainage that also could be triggering or wheezing.   Laboratory tests today we'll notify you of results.  You may be able to take an intermittent amount of anti-inflammatories for joint pain but they can have a negative effect on your kidneys and your liver however if they are okay today we may have to take a couple weeks of ibuprofen or a meloxicam am to calm down your pain. Keep appt  In January .

## 2011-11-08 NOTE — Progress Notes (Signed)
Subjective:    Patient ID: Madison Erickson, female    DOB: 14-Dec-1940, 71 y.o.   MRN: [id number]  HPI Patient comes in today for follow up of  multiple medical problems.  Due for labs and med check as PC in January however since last visit has had : Back surgery  Feb dr Venetia Maxon   Now with pain on right    And to call dr Venetia Maxon  About.  Knee surgery July 2012 right  Anterior knee pain.  Now on motrin as needed.  fpr arthritis  No med for pain.  Except  Couple motrin.   Has some hydrocodone also.   If neeed post surgery.  Bp   Up and down not checking  But taking meds  LIPIDS  No se of med  GERD   Taking med Ever other day.    Throat clearing    Effect.  Per ent.  recently she has had  Cough and chest tightness with out fever but has white phlegm stuffy nose and post nasal drianage..  Gets wheezing somtime  Doesn't use inhalers causes too much se jitteriness Review of Systems ROS:  GEN/ HEENT: No fever, significant weight changes sweats headaches vision problems hearing changes, CV/ PULM; No chest pain , syncope,edema  change in exercise tolerance. See above GI /GU: No adominal pain, vomiting, change in bowel habits. No blood in the stool. No significant GU symptoms. SKIN/HEME: ,no acute skin rashes suspicious lesions or bleeding. No lymphadenopathy, nodules, masses.  NEURO/ PSYCH:  No neurologic signs such as weakness numbness. No depression anxiety. IMM/ Allergy: No unusual infections.  Allergy .   REST of 12 system review negative except as per HPI  Outpatient Encounter Prescriptions as of 11/08/2011  Medication Sig Dispense Refill  . cetirizine (ZYRTEC) 10 MG tablet Take 10 mg by mouth daily.        . clobetasol (TEMOVATE) 0.05 % cream Apply 1 application topically 2 (two) times daily as needed. For irritation of skin       . esomeprazole (NEXIUM) 40 MG capsule Take 1 capsule (40 mg total) by mouth daily before breakfast.  90 capsule  0  . hydrochlorothiazide (HYDRODIURIL) 25 MG tablet Take  1 tablet (25 mg total) by mouth daily.  90 tablet  0  . metoprolol succinate (TOPROL-XL) 50 MG 24 hr tablet Take 1 tablet (50 mg total) by mouth daily.  90 tablet  0  . rosuvastatin (CRESTOR) 10 MG tablet Take 1 tablet (10 mg total) by mouth daily.  90 tablet  0  . DISCONTD: HYDROmorphone (DILAUDID) 2 MG tablet Take 2-4 mg by mouth every 6 (six) hours as needed. For pain      Past history family history social history reviewed in the electronic medical record.      Objective:   Physical Exam BP 136/90  Pulse 78  Temp 98.8 F (37.1 C) (Oral)  Wt 165 lb (74.844 kg)  SpO2 97% WDWN in NAD  Unlabored respirations; mildly congested  somewhat hoarse. Non toxic . HEENT: Normocephalic ;atraumatic , Eyes;  PERRL, EOMs  Full, lids and conjunctiva clear,,Ears: no deformities, canals nl, TM landmarks normal, Nose: no deformity or discharge but congested;face non tender Mouth : OP clear without lesion or edema . Neck: Supple without adenopathy or masses or bruits Chest:   bs = wheezes but good air movement  no rales or rhonchi well healed chest scar on right  CV:  S1-S2 no gallops or murmurs peripheral perfusion  is normal Skin :nl perfusion and no acute rashes  No clubbing cyanosis or edema    Assessment & Plan:  Wheezing:   Hx of  Some and doesn't want to use an inhaler  S/E  Jittery with  Inhaler poss WARI asthmatic bronchitis vs allergic will rx upper  Nose  flonase trial  Get c xray  Add prednisone if no PNA doubt.  Ok to get flu vaccine  Minimal pred if used    Shouldn't change antibody response.  Labs today  For Disease Management   HT  GERD LIPIDS DJD fatty liver concern about  Taking nsaid but ok to take low dose for short time if doing ok  Plain mucinex   May help move the phleqm.  Can add

## 2011-11-09 DIAGNOSIS — R062 Wheezing: Secondary | ICD-10-CM | POA: Insufficient documentation

## 2011-11-09 NOTE — Progress Notes (Signed)
Quick Note:  Pt informed ______ 

## 2011-11-14 ENCOUNTER — Ambulatory Visit (INDEPENDENT_AMBULATORY_CARE_PROVIDER_SITE_OTHER): Payer: Medicare Other | Admitting: Family Medicine

## 2011-11-14 ENCOUNTER — Encounter: Payer: Self-pay | Admitting: Family Medicine

## 2011-11-14 VITALS — BP 142/76 | HR 85 | Temp 98.7°F

## 2011-11-14 DIAGNOSIS — R300 Dysuria: Secondary | ICD-10-CM | POA: Diagnosis not present

## 2011-11-14 LAB — POCT URINALYSIS DIPSTICK
Glucose, UA: NEGATIVE
Nitrite, UA: NEGATIVE
Protein, UA: NEGATIVE
Urobilinogen, UA: 0.2

## 2011-11-14 MED ORDER — CIPROFLOXACIN HCL 250 MG PO TABS: 250.0000 mg | ORAL_TABLET | Freq: Two times a day (BID) | ORAL | Status: DC

## 2011-11-14 NOTE — Progress Notes (Signed)
Chief Complaint  Patient presents with  . Urinary Tract Infection    dsyuria    HPI:  Dysuria: -started this morning -symptoms: dysuria, urinary urgency and frequency -denies:fevers, chills, nausea, vomiting, flank/abd pain -also has labial lichen sclerosis and uses clobetasol occ for this -lives half the time in [place] and has had frequent UTIs there and treated - last UTI about one year ago and cipro worked -per [employer] recurrent UTIs and blood in urine - has urologist and has polyps.  ROS: See pertinent positives and negatives per HPI.  Past Medical History  Diagnosis Date  . Hematuria     urethral polyps  . HT (hammer toe)   . Osteoarthritis   . Lichen sclerosus   . GERD (gastroesophageal reflux disease)   . Blood transfusion abn reaction or complication, no procedure mishap   . Urinary incontinence   . Eczema   . Abnormal LFTs     fatty liver on [place] MRI faay liver and hemangioma 2008 biopsy 2011  . Bladder polyps   . Benign tumor   . Colon polyp 2009    Tubulovillous adenoma   . Diverticulosis of colon   . Status post partial lobectomy of lung     for bronchiesctesis  . Asthma     allergy  . Kidney disease     nephritis as  child. polpys, hematuria  . UTI (lower urinary tract infection)   . Cancer     squameous cell lsft leg hx  . Hypertension   . Migraines     hx migraines  . Shortness of breath     partial lower rt lung lobectomy.  . Blood transfusion 1969    Family History  Problem Relation Age of Onset  . Cancer Mother     lung  . Heart disease Mother   . Diabetes Paternal Grandfather   . Cancer Paternal Grandfather     bladder  . Mental retardation Other     History   Social History  . Marital Status: Married    Spouse Name: N/A    Number of Children: N/A  . Years of Education: N/A   Social History Main Topics  . Smoking status: Never Smoker   . Smokeless tobacco: None   Comment: wine q 2-3 weeks  . Alcohol Use: Yes     4-5 per  year   . Drug Use: No  . Sexually Active:      hysterectomy   Other Topics Concern  . None   Social History Narrative   Retired Audiological scientist estate travels a lot to the beachMarriedAlcohol occasional socialHousehold of 2CaffeineBereaved parent    Current outpatient prescriptions:cetirizine (ZYRTEC) 10 MG tablet, Take 10 mg by mouth daily.  , Disp: , Rfl: ;  clobetasol (TEMOVATE) 0.05 % cream, Apply 1 application topically 2 (two) times daily as needed. For irritation of skin , Disp: , Rfl: ;  esomeprazole (NEXIUM) 40 MG capsule, Take 1 capsule (40 mg total) by mouth daily before breakfast., Disp: 90 capsule, Rfl: 0 fluticasone (FLONASE) 50 MCG/ACT nasal spray, 2 sprays each nostril qd, Disp: 16 g, Rfl: 3;  hydrochlorothiazide (HYDRODIURIL) 25 MG tablet, Take 1 tablet (25 mg total) by mouth daily., Disp: 90 tablet, Rfl: 0;  metoprolol succinate (TOPROL-XL) 50 MG 24 hr tablet, Take 1 tablet (50 mg total) by mouth daily., Disp: 90 tablet, Rfl: 0 predniSONE (DELTASONE) 20 MG tablet, Take 3 po qd for 2 days then 2 po qd for 3 days,or as  directed, Disp: 12 tablet, Rfl: 0;  rosuvastatin (CRESTOR) 10 MG tablet, Take 1 tablet (10 mg total) by mouth daily., Disp: 90 tablet, Rfl: 0;  ciprofloxacin (CIPRO) 250 MG tablet, Take 1 tablet (250 mg total) by mouth 2 (two) times daily., Disp: 6 tablet, Rfl: 0  EXAM:  Filed Vitals:   11/14/11 1500  BP: 142/76  Pulse: 85  Temp: 98.7 F (37.1 C)    There is no height or weight on file to calculate BMI.  GENERAL: vitals reviewed and listed above, alert, oriented, appears well hydrated and in no acute distress  HEENT: atraumatic, conjunttiva clear, no obvious abnormalities on inspection of external nose and ears  NECK: no obvious masses on inspection  LUNGS: clear to auscultation bilaterally, no wheezes, rales or rhonchi, good air movement  CV: HRRR, no peripheral edema  ABD: BS+, soft, NTTP, no CVA TTP  MS: moves all extremities without noticeable  abnormality  PSYCH: pleasant and cooperative, no obvious depression or anxiety  ASSESSMENT AND PLAN:  Discussed the following assessment and plan:  1. Dysuria  Culture, Urine   -Urine dip with leuks and blood, tx per above after discussion risks/benefits, culture pending -Patient advised to return or notify a doctor immediately if symptoms worsen or persist or new concerns arise.  Patient Instructions  -take antibiotic as instructed  -drink plenty of fluids  -follow up with your doctor as scheduled to check blood pressure or sooner if any concerns or symptoms do not improve with treatment     Randen Kauth R.

## 2011-11-14 NOTE — Patient Instructions (Addendum)
-  take antibiotic as instructed  -drink plenty of fluids  -follow up with your doctor as scheduled to check blood pressure or sooner if any concerns or symptoms do not improve with treatment

## 2011-11-16 LAB — URINE CULTURE: Colony Count: 40000

## 2011-12-18 ENCOUNTER — Other Ambulatory Visit: Payer: Self-pay | Admitting: Internal Medicine

## 2011-12-18 DIAGNOSIS — M48061 Spinal stenosis, lumbar region without neurogenic claudication: Secondary | ICD-10-CM | POA: Diagnosis not present

## 2011-12-30 ENCOUNTER — Other Ambulatory Visit: Payer: Self-pay | Admitting: Internal Medicine

## 2012-01-08 DIAGNOSIS — L940 Localized scleroderma [morphea]: Secondary | ICD-10-CM | POA: Diagnosis not present

## 2012-01-24 HISTORY — PX: KNEE ARTHROSCOPY: SUR90

## 2012-02-13 ENCOUNTER — Ambulatory Visit (INDEPENDENT_AMBULATORY_CARE_PROVIDER_SITE_OTHER): Payer: Medicare Other | Admitting: Internal Medicine

## 2012-02-13 ENCOUNTER — Encounter: Payer: Self-pay | Admitting: Internal Medicine

## 2012-02-13 VITALS — BP 140/80 | HR 76 | Temp 98.6°F | Ht 63.5 in | Wt 166.0 lb

## 2012-02-13 DIAGNOSIS — I10 Essential (primary) hypertension: Secondary | ICD-10-CM

## 2012-02-13 DIAGNOSIS — M6789 Other specified disorders of synovium and tendon, multiple sites: Secondary | ICD-10-CM

## 2012-02-13 DIAGNOSIS — K7689 Other specified diseases of liver: Secondary | ICD-10-CM

## 2012-02-13 DIAGNOSIS — Z23 Encounter for immunization: Secondary | ICD-10-CM | POA: Diagnosis not present

## 2012-02-13 DIAGNOSIS — M67879 Other specified disorders of synovium and tendon, unspecified ankle and foot: Secondary | ICD-10-CM

## 2012-02-13 DIAGNOSIS — K760 Fatty (change of) liver, not elsewhere classified: Secondary | ICD-10-CM

## 2012-02-13 DIAGNOSIS — J302 Other seasonal allergic rhinitis: Secondary | ICD-10-CM

## 2012-02-13 DIAGNOSIS — R7309 Other abnormal glucose: Secondary | ICD-10-CM | POA: Diagnosis not present

## 2012-02-13 DIAGNOSIS — J309 Allergic rhinitis, unspecified: Secondary | ICD-10-CM

## 2012-02-13 DIAGNOSIS — E785 Hyperlipidemia, unspecified: Secondary | ICD-10-CM | POA: Diagnosis not present

## 2012-02-13 DIAGNOSIS — Z0000 Encounter for general adult medical examination without abnormal findings: Secondary | ICD-10-CM | POA: Diagnosis not present

## 2012-02-13 LAB — LIPID PANEL
Cholesterol: 153 mg/dL (ref 0–200)
HDL: 30.9 mg/dL — ABNORMAL LOW (ref >39.00)
LDL Cholesterol: 85 mg/dL (ref 0–99)
Triglycerides: 187 mg/dL — ABNORMAL HIGH (ref 0.0–149.0)
VLDL: 37.4 mg/dL (ref 0.0–40.0)

## 2012-02-13 LAB — HEPATIC FUNCTION PANEL
ALT: 44 U/L — ABNORMAL HIGH (ref 0–35)
Total Bilirubin: 0.5 mg/dL (ref 0.3–1.2)

## 2012-02-13 MED ORDER — HYDROCHLOROTHIAZIDE 25 MG PO TABS: 25.0000 mg | ORAL_TABLET | Freq: Every day | ORAL | Status: DC

## 2012-02-13 MED ORDER — ROSUVASTATIN CALCIUM 10 MG PO TABS: 10.0000 mg | ORAL_TABLET | Freq: Every day | ORAL | Status: DC

## 2012-02-13 MED ORDER — METOPROLOL SUCCINATE ER 50 MG PO TB24: 50.0000 mg | ORAL_TABLET | Freq: Every day | ORAL | Status: DC

## 2012-02-13 MED ORDER — ESOMEPRAZOLE MAGNESIUM 40 MG PO CPDR: 40.0000 mg | DELAYED_RELEASE_CAPSULE | Freq: Every day | ORAL | Status: DC

## 2012-02-13 NOTE — Progress Notes (Signed)
Chief Complaint  Patient presents with  . Annual Exam    Medicare.  Needs all medications renewed.    HPI: Patient comes in today for Preventive Medicare wellness visit . No major injuries, ed visits ,hospitalizations , new medications since last visit.  Had microdiscectomy l4-5 2 13   Doing ok exercises on bike Lipids: crestor worries about liver no se noted  Had evaluation in past  Per dr Leone Payor GI nexium qod  No acute changes BP taking med needs refill meto and hctz   Hearing:  Ok   Vision:  No limitations at present . Last eye check UTD  Safety:  Has smoke detector and wears seat belts.  No firearms. No excess sun exposure. Sees dentist regularly.  Falls:  No   Advance directive :  Reviewed   Memory: Felt to be good  , no concern from her or her family.  Depression: No anhedonia unusual crying or depressive symptoms  Nutrition: Eats well balanced diet; adequate calcium and vitamin D. No swallowing chewing problems.  Injury: no major injuries in the last six months.  Other healthcare providers:  Reviewed today .  Social:  Lives with spouse married. No pets.   Preventive parameters: up-to-date  Reviewed   ADLS:   There are no problems or need for assistance  driving, feeding, obtaining food, dressing, toileting and bathing, managing money using phone. She is independent.  EXERCISE/ HABITS  Per week ? Recumbent bike  Not a lot  No tobacco    etoh seldom   ROS:  GEN/ HEENT: No fever, significant weight changes sweats headaches vision problems hearing changes, CV/ PULM; No chest pain shortness of breath cough, syncope,edema  change in exercise tolerance. Chronic nasal drainage  GI /GU: No adominal pain, vomiting, change in bowel habits. No blood in the stool.  SKIN/HEME: ,no acute skin rashes suspicious lesions or bleeding. No lymphadenopathy, nodules, masses.  NEURO/ PSYCH:  No neurologic signs such as weakness numbness. No depression anxiety. IMM/ Allergy: No unusual  infections.  Allergy .   REST of 12 system review negative except as per HPI   Past Medical History  Diagnosis Date  . Hematuria     urethral polyps  . HT (hammer toe)   . Osteoarthritis   . Lichen sclerosus   . GERD (gastroesophageal reflux disease)   . Blood transfusion abn reaction or complication, no procedure mishap   . Urinary incontinence   . Eczema   . Abnormal LFTs     fatty liver on [place] MRI faay liver and hemangioma 2008 biopsy 2011  . Bladder polyps   . Benign tumor   . Colon polyp 2009    Tubulovillous adenoma   . Diverticulosis of colon   . Status post partial lobectomy of lung     for bronchiesctesis  . Asthma     allergy  . Kidney disease     nephritis as  child. polpys, hematuria  . UTI (lower urinary tract infection)   . Cancer     squameous cell lsft leg hx  . Hypertension   . Migraines     hx migraines  . Shortness of breath     partial lower rt lung lobectomy.  . Blood transfusion 1969  . Fatty liver disease, nonalcoholic     confirmed biopsy 1610 mild no fibrosis     Family History  Problem Relation Age of Onset  . Cancer Mother     lung  . Heart disease Mother   .  Diabetes Paternal Grandfather   . Cancer Paternal Grandfather     bladder  . Mental retardation Other     History   Social History  . Marital Status: Married    Spouse Name: N/A    Number of Children: N/A  . Years of Education: N/A   Social History Main Topics  . Smoking status: Never Smoker   . Smokeless tobacco: None     Comment: wine q 2-3 weeks  . Alcohol Use: Yes     Comment: 4-5 per year   . Drug Use: No  . Sexually Active:      Comment: hysterectomy   Other Topics Concern  . None   Social History Narrative   Retired Audiological scientist estate travels a lot to the beachMarriedAlcohol occasional socialHousehold of 2CaffeineBereaved parent    Outpatient Encounter Prescriptions as of 02/13/2012  Medication Sig Dispense Refill  . cetirizine (ZYRTEC) 10 MG tablet Take 10  mg by mouth daily.        . clobetasol (TEMOVATE) 0.05 % cream Apply 1 application topically 2 (two) times daily as needed. For irritation of skin       . esomeprazole (NEXIUM) 40 MG capsule Take 1 capsule (40 mg total) by mouth daily before breakfast.  90 capsule  3  . fluticasone (FLONASE) 50 MCG/ACT nasal spray 2 sprays each nostril qd  16 g  3  . hydrochlorothiazide (HYDRODIURIL) 25 MG tablet Take 1 tablet (25 mg total) by mouth daily.  90 tablet  3  . metoprolol succinate (TOPROL-XL) 50 MG 24 hr tablet Take 1 tablet (50 mg total) by mouth daily. Take with or immediately following a meal.  90 tablet  3  . rosuvastatin (CRESTOR) 10 MG tablet Take 1 tablet (10 mg total) by mouth daily.  90 tablet  3  . [DISCONTINUED] CRESTOR 10 MG tablet TAKE 1 TABLET (10 MG TOTAL) DAILY  90 tablet  0  . [DISCONTINUED] hydrochlorothiazide (HYDRODIURIL) 25 MG tablet TAKE 1 TABLET (25 MG TOTAL) DAILY  90 tablet  0  . [DISCONTINUED] metoprolol succinate (TOPROL-XL) 50 MG 24 hr tablet TAKE 1 TABLET (50 MG TOTAL) DAILY  90 tablet  0  . [DISCONTINUED] NEXIUM 40 MG capsule TAKE 1 CAPSULE (40 MG TOTAL) DAILY BEFORE BREAKFAST  90 capsule  0  . [DISCONTINUED] ciprofloxacin (CIPRO) 250 MG tablet Take 1 tablet (250 mg total) by mouth 2 (two) times daily.  6 tablet  0  . [DISCONTINUED] predniSONE (DELTASONE) 20 MG tablet Take 3 po qd for 2 days then 2 po qd for 3 days,or as directed  12 tablet  0    EXAM:  BP 140/80  Pulse 76  Temp 98.6 F (37 C) (Oral)  Ht 5' 3.5" (1.613 m)  Wt 166 lb (75.297 kg)  BMI 28.94 kg/m2  SpO2 96%  Body mass index is 28.94 kg/(m^2).  Physical Exam: Vital signs reviewed [id number] is a well-developed well-nourished alert cooperative   who appears stated age in no acute distress.  HEENT: normocephalic atraumatic , Eyes: PERRL EOM's full, conjunctiva clear, Nares: paten,t no deformity discharge or tenderness., Ears: no deformity EAC's clear TMs with normal landmarks. Mouth: clear OP, no  lesions, edema.  Moist mucous membranes. Dentition in adequate repair. NECK: supple without masses, thyromegaly or bruits. CHEST/PULM:  Clear to auscultation and percussion breath sounds equal no wheeze , rales or rhonchi. No chest wall deformities or tenderness. Breast: normal by inspection . No dimpling, discharge, masses, tenderness or discharge . CV:  PMI is nondisplaced, S1 S2 no gallops, murmurs, rubs. Peripheral pulses are full without delay.No JVD .  ABDOMEN: Bowel sounds normal nontender  No guard or rebound, no hepato splenomegal no CVA tenderness.  No hernia. Extremtities:  No clubbing cyanosis or edema, no acute joint swelling or redness no focal atrophy right  Achilles with large swelling soft no pain or redness right  NEURO:  Oriented x3, cranial nerves 3-12 appear to be intact, no obvious focal weakness,gait within normal limits no abnormal reflexes or asymmetrical SKIN: No acute rashes normal turgor, color, no bruising or petechiae. PSYCH: Oriented, good eye contact, no obvious depression anxiety, cognition and judgment appear normal. LN: no cervical axillary inguinal adenopathy No noted deficits in memory, attention, and speech.   Lab Results  Component Value Date   WBC 10.6* 11/08/2011   HGB 14.3 11/08/2011   HCT 43.5 11/08/2011   PLT 269.0 11/08/2011   GLUCOSE 79 11/08/2011   CHOL 153 02/13/2012   TRIG 187.0* 02/13/2012   HDL 30.90* 02/13/2012   LDLDIRECT 201.9 10/26/2008   LDLCALC 85 02/13/2012   ALT 44* 02/13/2012   AST 36 02/13/2012   NA 138 11/08/2011   K 3.7 11/08/2011   CL 101 11/08/2011   CREATININE 0.6 11/08/2011   BUN 13 11/08/2011   CO2 31 11/08/2011   TSH 1.54 11/08/2011   INR 0.96 01/29/2009   HGBA1C 6.0 11/08/2011    ASSESSMENT AND PLAN:  Discussed the following assessment and plan:  1. Medicare annual wellness visit, subsequent    2. HYPERTENSION  Hepatic function panel, Lipid panel  3. FATTY LIVER DISEASE  Hepatic function panel, Lipid panel  4.  HYPERLIPIDEMIA  Hepatic function panel, Lipid panel  5. HYPERGLYCEMIA, FASTING  Hepatic function panel, Lipid panel  6. Need for Tdap vaccination  Tdap vaccine greater than or equal to 7yo IM  7. Fatty liver disease, nonalcoholic    8. Mass of Achilles tendon     from injury non tender no lof but pretty large   9. Allergic rhinitis, seasonal    has had 2 pna  In past  X 2 cant remember dates.  Patient Care Team: Madelin Headings, MD as PCP - General Maeola Harman, MD as Attending Physician (Neurosurgery) Oliver Pila, MD as Attending Physician (Obstetrics and Gynecology) Iva Boop, MD as Attending Physician (Gastroenterology)  Patient Instructions  Continue lifestyle intervention healthy eating and exercise . consider getting ortho to see the right achilles swelling.   Will notify you  of labs when available. tdap today . flonase evey day to see if helps upper drainage .   Preventive Care for Adults, Female A healthy lifestyle and preventive care can promote health and wellness. Preventive health guidelines for women include the following key practices.  A routine yearly physical is a good way to check with your caregiver about your health and preventive screening. It is a chance to share any concerns and updates on your health, and to receive a thorough exam.  Visit your dentist for a routine exam and preventive care every 6 months. Brush your teeth twice a day and floss once a day. Good oral hygiene prevents tooth decay and gum disease.  The frequency of eye exams is based on your age, health, family medical history, use of contact lenses, and other factors. Follow your caregiver's recommendations for frequency of eye exams.  Eat a healthy diet. Foods like vegetables, fruits, whole grains, low-fat dairy products, and lean protein foods contain  the nutrients you need without too many calories. Decrease your intake of foods high in solid fats, added sugars, and salt. Eat the  right amount of calories for you.Get information about a proper diet from your caregiver, if necessary.  Regular physical exercise is one of the most important things you can do for your health. Most adults should get at least 150 minutes of moderate-intensity exercise (any activity that increases your heart rate and causes you to sweat) each week. In addition, most adults need muscle-strengthening exercises on 2 or more days a week.  Maintain a healthy weight. The body mass index (BMI) is a screening tool to identify possible weight problems. It provides an estimate of body fat based on height and weight. Your caregiver can help determine your BMI, and can help you achieve or maintain a healthy weight.For adults 20 years and older:  A BMI below 18.5 is considered underweight.  A BMI of 18.5 to 24.9 is normal.  A BMI of 25 to 29.9 is considered overweight.  A BMI of 30 and above is considered obese.  Maintain normal blood lipids and cholesterol levels by exercising and minimizing your intake of saturated fat. Eat a balanced diet with plenty of fruit and vegetables. Blood tests for lipids and cholesterol should begin at age 25 and be repeated every 5 years. If your lipid or cholesterol levels are high, you are over 50, or you are at high risk for heart disease, you may need your cholesterol levels checked more frequently.Ongoing high lipid and cholesterol levels should be treated with medicines if diet and exercise are not effective.  If you smoke, find out from your caregiver how to quit. If you do not use tobacco, do not start.  If you are pregnant, do not drink alcohol. If you are breastfeeding, be very cautious about drinking alcohol. If you are not pregnant and choose to drink alcohol, do not exceed 1 drink per day. One drink is considered to be 12 ounces (355 mL) of beer, 5 ounces (148 mL) of wine, or 1.5 ounces (44 mL) of liquor.  Avoid use of street drugs. Do not share needles with  anyone. Ask for help if you need support or instructions about stopping the use of drugs.  High blood pressure causes heart disease and increases the risk of stroke. Your blood pressure should be checked at least every 1 to 2 years. Ongoing high blood pressure should be treated with medicines if weight loss and exercise are not effective.  If you are 62 to 72 years old, ask your caregiver if you should take aspirin to prevent strokes.  Diabetes screening involves taking a blood sample to check your fasting blood sugar level. This should be done once every 3 years, after age 67, if you are within normal weight and without risk factors for diabetes. Testing should be considered at a younger age or be carried out more frequently if you are overweight and have at least 1 risk factor for diabetes.  Breast cancer screening is essential preventive care for women. You should practice "breast self-awareness." This means understanding the normal appearance and feel of your breasts and may include breast self-examination. Any changes detected, no matter how small, should be reported to a caregiver. Women in their 18s and 30s should have a clinical breast exam (CBE) by a caregiver as part of a regular health exam every 1 to 3 years. After age 60, women should have a CBE every year. Starting at age  40, women should consider having a mammography (breast X-ray test) every year. Women who have a family history of breast cancer should talk to their caregiver about genetic screening. Women at a high risk of breast cancer should talk to their caregivers about having magnetic resonance imaging (MRI) and a mammography every year.  The Pap test is a screening test for cervical cancer. A Pap test can show cell changes on the cervix that might become cervical cancer if left untreated. A Pap test is a procedure in which cells are obtained and examined from the lower end of the uterus (cervix).  Women should have a Pap test  starting at age 12.  Between ages 40 and 61, Pap tests should be repeated every 2 years.  Beginning at age 53, you should have a Pap test every 3 years as long as the past 3 Pap tests have been normal.  Some women have medical problems that increase the chance of getting cervical cancer. Talk to your caregiver about these problems. It is especially important to talk to your caregiver if a new problem develops soon after your last Pap test. In these cases, your caregiver may recommend more frequent screening and Pap tests.  The above recommendations are the same for women who have or have not gotten the vaccine for human papillomavirus (HPV).  If you had a hysterectomy for a problem that was not cancer or a condition that could lead to cancer, then you no longer need Pap tests. Even if you no longer need a Pap test, a regular exam is a good idea to make sure no other problems are starting.  If you are between ages 30 and 19, and you have had normal Pap tests going back 10 years, you no longer need Pap tests. Even if you no longer need a Pap test, a regular exam is a good idea to make sure no other problems are starting.  If you have had past treatment for cervical cancer or a condition that could lead to cancer, you need Pap tests and screening for cancer for at least 20 years after your treatment.  If Pap tests have been discontinued, risk factors (such as a new sexual partner) need to be reassessed to determine if screening should be resumed.  The HPV test is an additional test that may be used for cervical cancer screening. The HPV test looks for the virus that can cause the cell changes on the cervix. The cells collected during the Pap test can be tested for HPV. The HPV test could be used to screen women aged 21 years and older, and should be used in women of any age who have unclear Pap test results. After the age of 12, women should have HPV testing at the same frequency as a Pap  test.  Colorectal cancer can be detected and often prevented. Most routine colorectal cancer screening begins at the age of 74 and continues through age 52. However, your caregiver may recommend screening at an earlier age if you have risk factors for colon cancer. On a yearly basis, your caregiver may provide home test kits to check for hidden blood in the stool. Use of a small camera at the end of a tube, to directly examine the colon (sigmoidoscopy or colonoscopy), can detect the earliest forms of colorectal cancer. Talk to your caregiver about this at age 28, when routine screening begins. Direct examination of the colon should be repeated every 5 to 10 years through age  75, unless early forms of pre-cancerous polyps or small growths are found.  Hepatitis C blood testing is recommended for all people born from [birth year] through 1965 and any individual with known risks for hepatitis C.  Practice safe sex. Use condoms and avoid high-risk sexual practices to reduce the spread of sexually transmitted infections (STIs). STIs include gonorrhea, chlamydia, syphilis, trichomonas, herpes, HPV, and human immunodeficiency virus (HIV). Herpes, HIV, and HPV are viral illnesses that have no cure. They can result in disability, cancer, and death. Sexually active women aged 84 and younger should be checked for chlamydia. Older women with new or multiple partners should also be tested for chlamydia. Testing for other STIs is recommended if you are sexually active and at increased risk.  Osteoporosis is a disease in which the bones lose minerals and strength with aging. This can result in serious bone fractures. The risk of osteoporosis can be identified using a bone density scan. Women ages 1 and over and women at risk for fractures or osteoporosis should discuss screening with their caregivers. Ask your caregiver whether you should take a calcium supplement or vitamin D to reduce the rate of osteoporosis.  Menopause can  be associated with physical symptoms and risks. Hormone replacement therapy is available to decrease symptoms and risks. You should talk to your caregiver about whether hormone replacement therapy is right for you.  Use sunscreen with sun protection factor (SPF) of 30 or more. Apply sunscreen liberally and repeatedly throughout the day. You should seek shade when your shadow is shorter than you. Protect yourself by wearing long sleeves, pants, a wide-brimmed hat, and sunglasses year round, whenever you are outdoors.  Once a month, do a whole body skin exam, using a mirror to look at the skin on your back. Notify your caregiver of new moles, moles that have irregular borders, moles that are larger than a pencil eraser, or moles that have changed in shape or color.  Stay current with required immunizations.  Influenza. You need a dose every fall (or winter). The composition of the flu vaccine changes each year, so being vaccinated once is not enough.  Pneumococcal polysaccharide. You need 1 to 2 doses if you smoke cigarettes or if you have certain chronic medical conditions. You need 1 dose at age 59 (or older) if you have never been vaccinated.  Tetanus, diphtheria, pertussis (Tdap, Td). Get 1 dose of Tdap vaccine if you are younger than age 28, are over 50 and have contact with an infant, are a Research scientist (physical sciences), are pregnant, or simply want to be protected from whooping cough. After that, you need a Td booster dose every 10 years. Consult your caregiver if you have not had at least 3 tetanus and diphtheria-containing shots sometime in your life or have a deep or dirty wound.  HPV. You need this vaccine if you are a woman age 69 or younger. The vaccine is given in 3 doses over 6 months.  Measles, mumps, rubella (MMR). You need at least 1 dose of MMR if you were born in 1957 or later. You may also need a second dose.  Meningococcal. If you are age 47 to 35 and a first-year college student living in  a residence hall, or have one of several medical conditions, you need to get vaccinated against meningococcal disease. You may also need additional booster doses.  Zoster (shingles). If you are age 84 or older, you should get this vaccine.  Varicella (chickenpox). If you have never had  chickenpox or you were vaccinated but received only 1 dose, talk to your caregiver to find out if you need this vaccine.  Hepatitis A. You need this vaccine if you have a specific risk factor for hepatitis A virus infection or you simply wish to be protected from this disease. The vaccine is usually given as 2 doses, 6 to 18 months apart.  Hepatitis B. You need this vaccine if you have a specific risk factor for hepatitis B virus infection or you simply wish to be protected from this disease. The vaccine is given in 3 doses, usually over 6 months. Preventive Services / Frequency Ages 41 to 21  Blood pressure check.** / Every 1 to 2 years.  Lipid and cholesterol check.** / Every 5 years beginning at age 53.  Clinical breast exam.** / Every 3 years for women in their 19s and 30s.  Pap test.** / Every 2 years from ages 25 through 44. Every 3 years starting at age 43 through age 54 or 29 with a history of 3 consecutive normal Pap tests.  HPV screening.** / Every 3 years from ages 22 through ages 56 to 40 with a history of 3 consecutive normal Pap tests.  Hepatitis C blood test.** / For any individual with known risks for hepatitis C.  Skin self-exam. / Monthly.  Influenza immunization.** / Every year.  Pneumococcal polysaccharide immunization.** / 1 to 2 doses if you smoke cigarettes or if you have certain chronic medical conditions.  Tetanus, diphtheria, pertussis (Tdap, Td) immunization. / A one-time dose of Tdap vaccine. After that, you need a Td booster dose every 10 years.  HPV immunization. / 3 doses over 6 months, if you are 58 and younger.  Measles, mumps, rubella (MMR) immunization. / You need at  least 1 dose of MMR if you were born in 1957 or later. You may also need a second dose.  Meningococcal immunization. / 1 dose if you are age 48 to 19 and a first-year college student living in a residence hall, or have one of several medical conditions, you need to get vaccinated against meningococcal disease. You may also need additional booster doses.  Varicella immunization.** / Consult your caregiver.  Hepatitis A immunization.** / Consult your caregiver. 2 doses, 6 to 18 months apart.  Hepatitis B immunization.** / Consult your caregiver. 3 doses usually over 6 months. Ages 44 to 18  Blood pressure check.** / Every 1 to 2 years.  Lipid and cholesterol check.** / Every 5 years beginning at age 59.  Clinical breast exam.** / Every year after age 21.  Mammogram.** / Every year beginning at age 42 and continuing for as long as you are in good health. Consult with your caregiver.  Pap test.** / Every 3 years starting at age 58 through age 16 or 18 with a history of 3 consecutive normal Pap tests.  HPV screening.** / Every 3 years from ages 85 through ages 7 to 38 with a history of 3 consecutive normal Pap tests.  Fecal occult blood test (FOBT) of stool. / Every year beginning at age 43 and continuing until age 26. You may not need to do this test if you get a colonoscopy every 10 years.  Flexible sigmoidoscopy or colonoscopy.** / Every 5 years for a flexible sigmoidoscopy or every 10 years for a colonoscopy beginning at age 58 and continuing until age 87.  Hepatitis C blood test.** / For all people born from [birth year] through 1965 and any individual with known  risks for hepatitis C.  Skin self-exam. / Monthly.  Influenza immunization.** / Every year.  Pneumococcal polysaccharide immunization.** / 1 to 2 doses if you smoke cigarettes or if you have certain chronic medical conditions.  Tetanus, diphtheria, pertussis (Tdap, Td) immunization.** / A one-time dose of Tdap vaccine. After  that, you need a Td booster dose every 10 years.  Measles, mumps, rubella (MMR) immunization. / You need at least 1 dose of MMR if you were born in 1957 or later. You may also need a second dose.  Varicella immunization.** / Consult your caregiver.  Meningococcal immunization.** / Consult your caregiver.  Hepatitis A immunization.** / Consult your caregiver. 2 doses, 6 to 18 months apart.  Hepatitis B immunization.** / Consult your caregiver. 3 doses, usually over 6 months. Ages 62 and over  Blood pressure check.** / Every 1 to 2 years.  Lipid and cholesterol check.** / Every 5 years beginning at age 6.  Clinical breast exam.** / Every year after age 57.  Mammogram.** / Every year beginning at age 19 and continuing for as long as you are in good health. Consult with your caregiver.  Pap test.** / Every 3 years starting at age 34 through age 35 or 55 with a 3 consecutive normal Pap tests. Testing can be stopped between 65 and 70 with 3 consecutive normal Pap tests and no abnormal Pap or HPV tests in the past 10 years.  HPV screening.** / Every 3 years from ages 78 through ages 5 or 46 with a history of 3 consecutive normal Pap tests. Testing can be stopped between 65 and 70 with 3 consecutive normal Pap tests and no abnormal Pap or HPV tests in the past 10 years.  Fecal occult blood test (FOBT) of stool. / Every year beginning at age 48 and continuing until age 5. You may not need to do this test if you get a colonoscopy every 10 years.  Flexible sigmoidoscopy or colonoscopy.** / Every 5 years for a flexible sigmoidoscopy or every 10 years for a colonoscopy beginning at age 59 and continuing until age 39.  Hepatitis C blood test.** / For all people born from [birth year] through 1965 and any individual with known risks for hepatitis C.  Osteoporosis screening.** / A one-time screening for women ages 65 and over and women at risk for fractures or osteoporosis.  Skin self-exam. /  Monthly.  Influenza immunization.** / Every year.  Pneumococcal polysaccharide immunization.** / 1 dose at age 42 (or older) if you have never been vaccinated.  Tetanus, diphtheria, pertussis (Tdap, Td) immunization. / A one-time dose of Tdap vaccine if you are over 65 and have contact with an infant, are a Research scientist (physical sciences), or simply want to be protected from whooping cough. After that, you need a Td booster dose every 10 years.  Varicella immunization.** / Consult your caregiver.  Meningococcal immunization.** / Consult your caregiver.  Hepatitis A immunization.** / Consult your caregiver. 2 doses, 6 to 18 months apart.  Hepatitis B immunization.** / Check with your caregiver. 3 doses, usually over 6 months. ** Family history and personal history of risk and conditions may change your caregiver's recommendations. Document Released: 03/07/2001 Document Revised: 04/03/2011 Document Reviewed: 06/06/2010 [facility] Patient Information 2013 [place], [place].     Neta Mends. Sherrick Araki M.D. Health Maintenance  Topic Date Due  . Influenza Vaccine  09/23/2012  . Colonoscopy  02/17/2016  . Tetanus/tdap  02/12/2022  . Pneumococcal Polysaccharide Vaccine Age 39 And Over  Addressed  .  Zostavax  Completed   Health Maintenance Review

## 2012-02-13 NOTE — Patient Instructions (Addendum)
Continue lifestyle intervention healthy eating and exercise . consider getting ortho to see the right achilles swelling.   Will notify you  of labs when available. tdap today . flonase evey day to see if helps upper drainage .   Preventive Care for Adults, Female A healthy lifestyle and preventive care can promote health and wellness. Preventive health guidelines for women include the following key practices.  A routine yearly physical is a good way to check with your caregiver about your health and preventive screening. It is a chance to share any concerns and updates on your health, and to receive a thorough exam.  Visit your dentist for a routine exam and preventive care every 6 months. Brush your teeth twice a day and floss once a day. Good oral hygiene prevents tooth decay and gum disease.  The frequency of eye exams is based on your age, health, family medical history, use of contact lenses, and other factors. Follow your caregiver's recommendations for frequency of eye exams.  Eat a healthy diet. Foods like vegetables, fruits, whole grains, low-fat dairy products, and lean protein foods contain the nutrients you need without too many calories. Decrease your intake of foods high in solid fats, added sugars, and salt. Eat the right amount of calories for you.Get information about a proper diet from your caregiver, if necessary.  Regular physical exercise is one of the most important things you can do for your health. Most adults should get at least 150 minutes of moderate-intensity exercise (any activity that increases your heart rate and causes you to sweat) each week. In addition, most adults need muscle-strengthening exercises on 2 or more days a week.  Maintain a healthy weight. The body mass index (BMI) is a screening tool to identify possible weight problems. It provides an estimate of body fat based on height and weight. Your caregiver can help determine your BMI, and can help you  achieve or maintain a healthy weight.For adults 20 years and older:  A BMI below 18.5 is considered underweight.  A BMI of 18.5 to 24.9 is normal.  A BMI of 25 to 29.9 is considered overweight.  A BMI of 30 and above is considered obese.  Maintain normal blood lipids and cholesterol levels by exercising and minimizing your intake of saturated fat. Eat a balanced diet with plenty of fruit and vegetables. Blood tests for lipids and cholesterol should begin at age 32 and be repeated every 5 years. If your lipid or cholesterol levels are high, you are over 50, or you are at high risk for heart disease, you may need your cholesterol levels checked more frequently.Ongoing high lipid and cholesterol levels should be treated with medicines if diet and exercise are not effective.  If you smoke, find out from your caregiver how to quit. If you do not use tobacco, do not start.  If you are pregnant, do not drink alcohol. If you are breastfeeding, be very cautious about drinking alcohol. If you are not pregnant and choose to drink alcohol, do not exceed 1 drink per day. One drink is considered to be 12 ounces (355 mL) of beer, 5 ounces (148 mL) of wine, or 1.5 ounces (44 mL) of liquor.  Avoid use of street drugs. Do not share needles with anyone. Ask for help if you need support or instructions about stopping the use of drugs.  High blood pressure causes heart disease and increases the risk of stroke. Your blood pressure should be checked at least every 1  to 2 years. Ongoing high blood pressure should be treated with medicines if weight loss and exercise are not effective.  If you are 33 to 72 years old, ask your caregiver if you should take aspirin to prevent strokes.  Diabetes screening involves taking a blood sample to check your fasting blood sugar level. This should be done once every 3 years, after age 8, if you are within normal weight and without risk factors for diabetes. Testing should be  considered at a younger age or be carried out more frequently if you are overweight and have at least 1 risk factor for diabetes.  Breast cancer screening is essential preventive care for women. You should practice "breast self-awareness." This means understanding the normal appearance and feel of your breasts and may include breast self-examination. Any changes detected, no matter how small, should be reported to a caregiver. Women in their 21s and 30s should have a clinical breast exam (CBE) by a caregiver as part of a regular health exam every 1 to 3 years. After age 8, women should have a CBE every year. Starting at age 82, women should consider having a mammography (breast X-ray test) every year. Women who have a family history of breast cancer should talk to their caregiver about genetic screening. Women at a high risk of breast cancer should talk to their caregivers about having magnetic resonance imaging (MRI) and a mammography every year.  The Pap test is a screening test for cervical cancer. A Pap test can show cell changes on the cervix that might become cervical cancer if left untreated. A Pap test is a procedure in which cells are obtained and examined from the lower end of the uterus (cervix).  Women should have a Pap test starting at age 62.  Between ages 52 and 66, Pap tests should be repeated every 2 years.  Beginning at age 32, you should have a Pap test every 3 years as long as the past 3 Pap tests have been normal.  Some women have medical problems that increase the chance of getting cervical cancer. Talk to your caregiver about these problems. It is especially important to talk to your caregiver if a new problem develops soon after your last Pap test. In these cases, your caregiver may recommend more frequent screening and Pap tests.  The above recommendations are the same for women who have or have not gotten the vaccine for human papillomavirus (HPV).  If you had a  hysterectomy for a problem that was not cancer or a condition that could lead to cancer, then you no longer need Pap tests. Even if you no longer need a Pap test, a regular exam is a good idea to make sure no other problems are starting.  If you are between ages 43 and 17, and you have had normal Pap tests going back 10 years, you no longer need Pap tests. Even if you no longer need a Pap test, a regular exam is a good idea to make sure no other problems are starting.  If you have had past treatment for cervical cancer or a condition that could lead to cancer, you need Pap tests and screening for cancer for at least 20 years after your treatment.  If Pap tests have been discontinued, risk factors (such as a new sexual partner) need to be reassessed to determine if screening should be resumed.  The HPV test is an additional test that may be used for cervical cancer screening. The HPV  test looks for the virus that can cause the cell changes on the cervix. The cells collected during the Pap test can be tested for HPV. The HPV test could be used to screen women aged 18 years and older, and should be used in women of any age who have unclear Pap test results. After the age of 63, women should have HPV testing at the same frequency as a Pap test.  Colorectal cancer can be detected and often prevented. Most routine colorectal cancer screening begins at the age of 25 and continues through age 75. However, your caregiver may recommend screening at an earlier age if you have risk factors for colon cancer. On a yearly basis, your caregiver may provide home test kits to check for hidden blood in the stool. Use of a small camera at the end of a tube, to directly examine the colon (sigmoidoscopy or colonoscopy), can detect the earliest forms of colorectal cancer. Talk to your caregiver about this at age 81, when routine screening begins. Direct examination of the colon should be repeated every 5 to 10 years through age  47, unless early forms of pre-cancerous polyps or small growths are found.  Hepatitis C blood testing is recommended for all people born from [birth year] through 1965 and any individual with known risks for hepatitis C.  Practice safe sex. Use condoms and avoid high-risk sexual practices to reduce the spread of sexually transmitted infections (STIs). STIs include gonorrhea, chlamydia, syphilis, trichomonas, herpes, HPV, and human immunodeficiency virus (HIV). Herpes, HIV, and HPV are viral illnesses that have no cure. They can result in disability, cancer, and death. Sexually active women aged 38 and younger should be checked for chlamydia. Older women with new or multiple partners should also be tested for chlamydia. Testing for other STIs is recommended if you are sexually active and at increased risk.  Osteoporosis is a disease in which the bones lose minerals and strength with aging. This can result in serious bone fractures. The risk of osteoporosis can be identified using a bone density scan. Women ages 76 and over and women at risk for fractures or osteoporosis should discuss screening with their caregivers. Ask your caregiver whether you should take a calcium supplement or vitamin D to reduce the rate of osteoporosis.  Menopause can be associated with physical symptoms and risks. Hormone replacement therapy is available to decrease symptoms and risks. You should talk to your caregiver about whether hormone replacement therapy is right for you.  Use sunscreen with sun protection factor (SPF) of 30 or more. Apply sunscreen liberally and repeatedly throughout the day. You should seek shade when your shadow is shorter than you. Protect yourself by wearing long sleeves, pants, a wide-brimmed hat, and sunglasses year round, whenever you are outdoors.  Once a month, do a whole body skin exam, using a mirror to look at the skin on your back. Notify your caregiver of new moles, moles that have irregular borders,  moles that are larger than a pencil eraser, or moles that have changed in shape or color.  Stay current with required immunizations.  Influenza. You need a dose every fall (or winter). The composition of the flu vaccine changes each year, so being vaccinated once is not enough.  Pneumococcal polysaccharide. You need 1 to 2 doses if you smoke cigarettes or if you have certain chronic medical conditions. You need 1 dose at age 27 (or older) if you have never been vaccinated.  Tetanus, diphtheria, pertussis (Tdap, Td). Get  1 dose of Tdap vaccine if you are younger than age 27, are over 5 and have contact with an infant, are a Research scientist (physical sciences), are pregnant, or simply want to be protected from whooping cough. After that, you need a Td booster dose every 10 years. Consult your caregiver if you have not had at least 3 tetanus and diphtheria-containing shots sometime in your life or have a deep or dirty wound.  HPV. You need this vaccine if you are a woman age 50 or younger. The vaccine is given in 3 doses over 6 months.  Measles, mumps, rubella (MMR). You need at least 1 dose of MMR if you were born in 1957 or later. You may also need a second dose.  Meningococcal. If you are age 38 to 63 and a first-year college student living in a residence hall, or have one of several medical conditions, you need to get vaccinated against meningococcal disease. You may also need additional booster doses.  Zoster (shingles). If you are age 42 or older, you should get this vaccine.  Varicella (chickenpox). If you have never had chickenpox or you were vaccinated but received only 1 dose, talk to your caregiver to find out if you need this vaccine.  Hepatitis A. You need this vaccine if you have a specific risk factor for hepatitis A virus infection or you simply wish to be protected from this disease. The vaccine is usually given as 2 doses, 6 to 18 months apart.  Hepatitis B. You need this vaccine if you have a  specific risk factor for hepatitis B virus infection or you simply wish to be protected from this disease. The vaccine is given in 3 doses, usually over 6 months. Preventive Services / Frequency Ages 43 to 26  Blood pressure check.** / Every 1 to 2 years.  Lipid and cholesterol check.** / Every 5 years beginning at age 50.  Clinical breast exam.** / Every 3 years for women in their 16s and 30s.  Pap test.** / Every 2 years from ages 23 through 103. Every 3 years starting at age 42 through age 58 or 20 with a history of 3 consecutive normal Pap tests.  HPV screening.** / Every 3 years from ages 22 through ages 25 to 66 with a history of 3 consecutive normal Pap tests.  Hepatitis C blood test.** / For any individual with known risks for hepatitis C.  Skin self-exam. / Monthly.  Influenza immunization.** / Every year.  Pneumococcal polysaccharide immunization.** / 1 to 2 doses if you smoke cigarettes or if you have certain chronic medical conditions.  Tetanus, diphtheria, pertussis (Tdap, Td) immunization. / A one-time dose of Tdap vaccine. After that, you need a Td booster dose every 10 years.  HPV immunization. / 3 doses over 6 months, if you are 50 and younger.  Measles, mumps, rubella (MMR) immunization. / You need at least 1 dose of MMR if you were born in 1957 or later. You may also need a second dose.  Meningococcal immunization. / 1 dose if you are age 75 to 59 and a first-year college student living in a residence hall, or have one of several medical conditions, you need to get vaccinated against meningococcal disease. You may also need additional booster doses.  Varicella immunization.** / Consult your caregiver.  Hepatitis A immunization.** / Consult your caregiver. 2 doses, 6 to 18 months apart.  Hepatitis B immunization.** / Consult your caregiver. 3 doses usually over 6 months. Ages 26 to 30  Blood pressure check.** / Every 1 to 2 years.  Lipid and cholesterol  check.** / Every 5 years beginning at age 69.  Clinical breast exam.** / Every year after age 32.  Mammogram.** / Every year beginning at age 41 and continuing for as long as you are in good health. Consult with your caregiver.  Pap test.** / Every 3 years starting at age 52 through age 48 or 45 with a history of 3 consecutive normal Pap tests.  HPV screening.** / Every 3 years from ages 44 through ages 37 to 35 with a history of 3 consecutive normal Pap tests.  Fecal occult blood test (FOBT) of stool. / Every year beginning at age 66 and continuing until age 42. You may not need to do this test if you get a colonoscopy every 10 years.  Flexible sigmoidoscopy or colonoscopy.** / Every 5 years for a flexible sigmoidoscopy or every 10 years for a colonoscopy beginning at age 39 and continuing until age 92.  Hepatitis C blood test.** / For all people born from [birth year] through 1965 and any individual with known risks for hepatitis C.  Skin self-exam. / Monthly.  Influenza immunization.** / Every year.  Pneumococcal polysaccharide immunization.** / 1 to 2 doses if you smoke cigarettes or if you have certain chronic medical conditions.  Tetanus, diphtheria, pertussis (Tdap, Td) immunization.** / A one-time dose of Tdap vaccine. After that, you need a Td booster dose every 10 years.  Measles, mumps, rubella (MMR) immunization. / You need at least 1 dose of MMR if you were born in 1957 or later. You may also need a second dose.  Varicella immunization.** / Consult your caregiver.  Meningococcal immunization.** / Consult your caregiver.  Hepatitis A immunization.** / Consult your caregiver. 2 doses, 6 to 18 months apart.  Hepatitis B immunization.** / Consult your caregiver. 3 doses, usually over 6 months. Ages 73 and over  Blood pressure check.** / Every 1 to 2 years.  Lipid and cholesterol check.** / Every 5 years beginning at age 50.  Clinical breast exam.** / Every year after age  9.  Mammogram.** / Every year beginning at age 2 and continuing for as long as you are in good health. Consult with your caregiver.  Pap test.** / Every 3 years starting at age 8 through age 93 or 40 with a 3 consecutive normal Pap tests. Testing can be stopped between 65 and 70 with 3 consecutive normal Pap tests and no abnormal Pap or HPV tests in the past 10 years.  HPV screening.** / Every 3 years from ages 79 through ages 51 or 57 with a history of 3 consecutive normal Pap tests. Testing can be stopped between 65 and 70 with 3 consecutive normal Pap tests and no abnormal Pap or HPV tests in the past 10 years.  Fecal occult blood test (FOBT) of stool. / Every year beginning at age 1 and continuing until age 84. You may not need to do this test if you get a colonoscopy every 10 years.  Flexible sigmoidoscopy or colonoscopy.** / Every 5 years for a flexible sigmoidoscopy or every 10 years for a colonoscopy beginning at age 10 and continuing until age 36.  Hepatitis C blood test.** / For all people born from [birth year] through 1965 and any individual with known risks for hepatitis C.  Osteoporosis screening.** / A one-time screening for women ages 25 and over and women at risk for fractures or osteoporosis.  Skin self-exam. / Monthly.  Influenza immunization.** / Every year.  Pneumococcal polysaccharide immunization.** / 1 dose at age 56 (or older) if you have never been vaccinated.  Tetanus, diphtheria, pertussis (Tdap, Td) immunization. / A one-time dose of Tdap vaccine if you are over 65 and have contact with an infant, are a Research scientist (physical sciences), or simply want to be protected from whooping cough. After that, you need a Td booster dose every 10 years.  Varicella immunization.** / Consult your caregiver.  Meningococcal immunization.** / Consult your caregiver.  Hepatitis A immunization.** / Consult your caregiver. 2 doses, 6 to 18 months apart.  Hepatitis B immunization.** / Check with  your caregiver. 3 doses, usually over 6 months. ** Family history and personal history of risk and conditions may change your caregiver's recommendations. Document Released: 03/07/2001 Document Revised: 04/03/2011 Document Reviewed: 06/06/2010 [facility] Patient Information 2013 [place], [place].

## 2012-02-17 ENCOUNTER — Encounter: Payer: Self-pay | Admitting: Internal Medicine

## 2012-02-17 DIAGNOSIS — M67879 Other specified disorders of synovium and tendon, unspecified ankle and foot: Secondary | ICD-10-CM | POA: Insufficient documentation

## 2012-02-17 DIAGNOSIS — K760 Fatty (change of) liver, not elsewhere classified: Secondary | ICD-10-CM | POA: Insufficient documentation

## 2012-04-18 DIAGNOSIS — L821 Other seborrheic keratosis: Secondary | ICD-10-CM | POA: Diagnosis not present

## 2012-04-18 DIAGNOSIS — D485 Neoplasm of uncertain behavior of skin: Secondary | ICD-10-CM | POA: Diagnosis not present

## 2012-04-18 DIAGNOSIS — L820 Inflamed seborrheic keratosis: Secondary | ICD-10-CM | POA: Diagnosis not present

## 2012-05-22 DIAGNOSIS — H2510 Age-related nuclear cataract, unspecified eye: Secondary | ICD-10-CM | POA: Diagnosis not present

## 2012-05-23 DIAGNOSIS — Z85828 Personal history of other malignant neoplasm of skin: Secondary | ICD-10-CM | POA: Diagnosis not present

## 2012-05-23 DIAGNOSIS — L723 Sebaceous cyst: Secondary | ICD-10-CM | POA: Diagnosis not present

## 2012-05-23 DIAGNOSIS — L821 Other seborrheic keratosis: Secondary | ICD-10-CM | POA: Diagnosis not present

## 2012-05-23 DIAGNOSIS — D239 Other benign neoplasm of skin, unspecified: Secondary | ICD-10-CM | POA: Diagnosis not present

## 2012-05-23 DIAGNOSIS — L819 Disorder of pigmentation, unspecified: Secondary | ICD-10-CM | POA: Diagnosis not present

## 2012-05-23 DIAGNOSIS — C44319 Basal cell carcinoma of skin of other parts of face: Secondary | ICD-10-CM | POA: Diagnosis not present

## 2012-05-23 DIAGNOSIS — D235 Other benign neoplasm of skin of trunk: Secondary | ICD-10-CM | POA: Diagnosis not present

## 2012-05-23 DIAGNOSIS — D1801 Hemangioma of skin and subcutaneous tissue: Secondary | ICD-10-CM | POA: Diagnosis not present

## 2012-05-23 DIAGNOSIS — D485 Neoplasm of uncertain behavior of skin: Secondary | ICD-10-CM | POA: Diagnosis not present

## 2012-05-23 DIAGNOSIS — L980 Pyogenic granuloma: Secondary | ICD-10-CM | POA: Diagnosis not present

## 2012-06-11 DIAGNOSIS — C44319 Basal cell carcinoma of skin of other parts of face: Secondary | ICD-10-CM | POA: Diagnosis not present

## 2012-08-12 ENCOUNTER — Other Ambulatory Visit: Payer: Self-pay

## 2012-08-12 DIAGNOSIS — Z1231 Encounter for screening mammogram for malignant neoplasm of breast: Secondary | ICD-10-CM

## 2012-08-28 ENCOUNTER — Ambulatory Visit
Admission: RE | Admit: 2012-08-28 | Discharge: 2012-08-28 | Disposition: A | Discharge status: Home / Self Care | Payer: Medicare Other | Source: Ambulatory Visit

## 2012-08-28 DIAGNOSIS — Z1231 Encounter for screening mammogram for malignant neoplasm of breast: Secondary | ICD-10-CM | POA: Diagnosis not present

## 2012-08-29 ENCOUNTER — Encounter: Payer: Self-pay | Admitting: Internal Medicine

## 2012-10-07 DIAGNOSIS — L739 Follicular disorder, unspecified: Secondary | ICD-10-CM | POA: Diagnosis not present

## 2012-10-07 DIAGNOSIS — Z85828 Personal history of other malignant neoplasm of skin: Secondary | ICD-10-CM | POA: Diagnosis not present

## 2012-10-07 DIAGNOSIS — T148 Other injury of unspecified body region: Secondary | ICD-10-CM | POA: Diagnosis not present

## 2012-10-07 DIAGNOSIS — L219 Seborrheic dermatitis, unspecified: Secondary | ICD-10-CM | POA: Diagnosis not present

## 2012-11-07 DIAGNOSIS — L821 Other seborrheic keratosis: Secondary | ICD-10-CM | POA: Diagnosis not present

## 2012-11-07 DIAGNOSIS — Z85828 Personal history of other malignant neoplasm of skin: Secondary | ICD-10-CM | POA: Diagnosis not present

## 2012-11-07 DIAGNOSIS — L219 Seborrheic dermatitis, unspecified: Secondary | ICD-10-CM | POA: Diagnosis not present

## 2012-11-28 ENCOUNTER — Other Ambulatory Visit: Payer: Self-pay

## 2012-12-05 DIAGNOSIS — N390 Urinary tract infection, site not specified: Secondary | ICD-10-CM | POA: Diagnosis not present

## 2012-12-05 DIAGNOSIS — J069 Acute upper respiratory infection, unspecified: Secondary | ICD-10-CM | POA: Diagnosis not present

## 2012-12-12 ENCOUNTER — Ambulatory Visit (INDEPENDENT_AMBULATORY_CARE_PROVIDER_SITE_OTHER): Payer: Medicare Other | Admitting: Internal Medicine

## 2012-12-12 ENCOUNTER — Encounter: Payer: Self-pay | Admitting: Internal Medicine

## 2012-12-12 VITALS — BP 160/90 | HR 86 | Temp 98.1°F | Wt 165.0 lb

## 2012-12-12 DIAGNOSIS — I10 Essential (primary) hypertension: Secondary | ICD-10-CM | POA: Diagnosis not present

## 2012-12-12 DIAGNOSIS — L309 Dermatitis, unspecified: Secondary | ICD-10-CM

## 2012-12-12 DIAGNOSIS — N390 Urinary tract infection, site not specified: Secondary | ICD-10-CM | POA: Diagnosis not present

## 2012-12-12 DIAGNOSIS — L259 Unspecified contact dermatitis, unspecified cause: Secondary | ICD-10-CM

## 2012-12-12 MED ORDER — FLUOCINONIDE 0.05 % EX OINT: 1.0000 "application " | TOPICAL_OINTMENT | Freq: Two times a day (BID) | CUTANEOUS | Status: DC

## 2012-12-12 NOTE — Progress Notes (Signed)
Chief Complaint  Patient presents with  . Rash    Is on her abdomen, back, buttocks and breast. Has seen the dermatologist twice but was not on her body during those visit.  On Cipro for a UTI.      HPI: Patient comes in today for SDA for   problem evaluation. Onset for months   Actually has serm but hard to get in  . Has lichen sclerosis and  Hx of scalp b for eczema  And then saw a dermatologist and used tdopivcal  Saw dr Yetta Barre a month ago.   comein here. Today cause  Itchy pink red rash like years in the past that was rx with prednisone to clear up. Itchy left hip area waistline and left axilla bra area and right buttock area.  Using  benadryl tablets.  Got some cetaphil recnetly  rx for uti with cipro finishing in 2 days   Had rash before started and no felt to be related ROS: See pertinent positives and negatives per HPI. Cough resolving uri  Past Medical History  Diagnosis Date  . Hematuria     urethral polyps  . HT (hammer toe)   . Osteoarthritis   . Lichen sclerosus   . GERD (gastroesophageal reflux disease)   . Blood transfusion abn reaction or complication, no procedure mishap   . Urinary incontinence   . Eczema   . Abnormal LFTs     fatty liver on [place] MRI faay liver and hemangioma 2008 biopsy 2011  . Bladder polyps   . Benign tumor   . Colon polyp 2009    Tubulovillous adenoma   . Diverticulosis of colon   . Status post partial lobectomy of lung     for bronchiesctesis  . Asthma     allergy  . Kidney disease     nephritis as  child. polpys, hematuria  . UTI (lower urinary tract infection)   . Cancer     squameous cell lsft leg hx  . Hypertension   . Migraines     hx migraines  . Shortness of breath     partial lower rt lung lobectomy.  . Blood transfusion 1969  . Fatty liver disease, nonalcoholic     confirmed biopsy 1610 mild no fibrosis     Family History  Problem Relation Age of Onset  . Cancer Mother     lung  . Heart disease Mother   .  Diabetes Paternal Grandfather   . Cancer Paternal Grandfather     bladder  . Mental retardation Other     History   Social History  . Marital Status: Married    Spouse Name: N/A    Number of Children: N/A  . Years of Education: N/A   Social History Main Topics  . Smoking status: Never Smoker   . Smokeless tobacco: None     Comment: wine q 2-3 weeks  . Alcohol Use: Yes     Comment: 4-5 per year   . Drug Use: No  . Sexual Activity:      Comment: hysterectomy   Other Topics Concern  . None   Social History Narrative   Retired Audiological scientist estate travels a lot to [place]   Married   Alcohol occasional social   Household of 2   Caffeine   Bereaved parent    Outpatient Encounter Prescriptions as of 12/12/2012  Medication Sig  . cetirizine (ZYRTEC) 10 MG tablet Take 10 mg by mouth daily.    [name]  clobetasol (TEMOVATE) 0.05 % cream Apply 1 application topically 2 (two) times daily as needed. For irritation of skin   . esomeprazole (NEXIUM) 40 MG capsule Take 1 capsule (40 mg total) by mouth daily before breakfast.  . hydrochlorothiazide (HYDRODIURIL) 25 MG tablet Take 1 tablet (25 mg total) by mouth daily.  . metoprolol succinate (TOPROL-XL) 50 MG 24 hr tablet Take 1 tablet (50 mg total) by mouth daily. Take with or immediately following a meal.  . rosuvastatin (CRESTOR) 10 MG tablet Take 1 tablet (10 mg total) by mouth daily.  . fluocinonide ointment (LIDEX) 0.05 % Apply 1 application topically 2 (two) times daily. Not on face  . [DISCONTINUED] fluticasone (FLONASE) 50 MCG/ACT nasal spray 2 sprays each nostril qd    EXAM:  BP 160/90  Pulse 86  Temp(Src) 98.1 F (36.7 C) (Oral)  Wt 165 lb (74.844 kg)  SpO2 98%  Body mass index is 28.77 kg/(m^2).  GENERAL: vitals reviewed and listed above, alert, oriented, appears well hydrated and in no acute distress HEENT: atraumatic, conjunctiva  clear, no obvious abnormalities on inspection of external nose and ears  NECK: no obvious  masses on inspection palpation  CV: HRRR, no clubbing cyanosis or  peripheral edema nl cap refill  Skin no icterus  Indistinct rash left hip right buttocks at wair line  Patch left upper chest under bra  Scaly dry scalp no rash seen  MS: moves all extremities without noticeable focal  abnormality PSYCH: pleasant and cooperative, no obvious depression or anxiety  ASSESSMENT AND PLAN:  Discussed the following assessment and plan:  Eczema - probable  hx bx scalp in past  dry skin issues may be contributing   UTI (urinary tract infection) - under rx   Unspecified essential hypertension - not adressed    Disc skin care and topical lidexs ointment   Hydration etc and fu with derm if needed. Due for lab monitoring lfts etc   sched for PV with labs at that time for Jan Feb. -Patient advised to return or notify health care team  if symptoms worsen or persist or new concerns arise.  Patient Instructions  stop the dryer sheet sheets for now.  Moisturize skin out of shower .  To avoid   itcy dry skin.   Pat dry do within 5 minutes .    Stronger Topical steroid for now  And if not better in 2 weeks consider either  . Seeing dermatologist  And or prednisone but prefer  Not to use  Systemic steroids at this time.      Neta Mends. Tacy Chavis M.D.

## 2012-12-12 NOTE — Patient Instructions (Addendum)
stop the dryer sheet sheets for now.  Moisturize skin out of shower .  To avoid   itcy dry skin.   Pat dry do within 5 minutes .    Stronger Topical steroid for now  And if not better in 2 weeks consider either  . Seeing dermatologist  And or prednisone but prefer  Not to use  Systemic steroids at this time.

## 2012-12-17 DIAGNOSIS — L259 Unspecified contact dermatitis, unspecified cause: Secondary | ICD-10-CM | POA: Diagnosis not present

## 2012-12-17 DIAGNOSIS — Z85828 Personal history of other malignant neoplasm of skin: Secondary | ICD-10-CM | POA: Diagnosis not present

## 2012-12-25 DIAGNOSIS — M545 Low back pain, unspecified: Secondary | ICD-10-CM | POA: Diagnosis not present

## 2012-12-25 DIAGNOSIS — IMO0002 Reserved for concepts with insufficient information to code with codable children: Secondary | ICD-10-CM | POA: Diagnosis not present

## 2013-02-07 ENCOUNTER — Other Ambulatory Visit: Payer: Self-pay | Admitting: Internal Medicine

## 2013-03-11 ENCOUNTER — Encounter: Payer: Medicare Other | Admitting: Internal Medicine

## 2013-03-17 ENCOUNTER — Encounter: Payer: Self-pay | Admitting: Internal Medicine

## 2013-03-17 ENCOUNTER — Ambulatory Visit (INDEPENDENT_AMBULATORY_CARE_PROVIDER_SITE_OTHER)
Admission: RE | Admit: 2013-03-17 | Discharge: 2013-03-17 | Disposition: A | Discharge status: Home / Self Care | Payer: Medicare Other | Source: Ambulatory Visit | Attending: Internal Medicine | Admitting: Internal Medicine

## 2013-03-17 ENCOUNTER — Ambulatory Visit (INDEPENDENT_AMBULATORY_CARE_PROVIDER_SITE_OTHER): Payer: Medicare Other | Admitting: Internal Medicine

## 2013-03-17 VITALS — BP 144/80 | HR 96 | Temp 99.1°F | Ht 63.0 in | Wt 167.0 lb

## 2013-03-17 DIAGNOSIS — R062 Wheezing: Secondary | ICD-10-CM | POA: Diagnosis not present

## 2013-03-17 DIAGNOSIS — Z9889 Other specified postprocedural states: Secondary | ICD-10-CM

## 2013-03-17 DIAGNOSIS — R6889 Other general symptoms and signs: Secondary | ICD-10-CM

## 2013-03-17 DIAGNOSIS — R0601 Orthopnea: Secondary | ICD-10-CM | POA: Diagnosis not present

## 2013-03-17 DIAGNOSIS — R0989 Other specified symptoms and signs involving the circulatory and respiratory systems: Secondary | ICD-10-CM

## 2013-03-17 DIAGNOSIS — Z902 Acquired absence of lung [part of]: Secondary | ICD-10-CM

## 2013-03-17 MED ORDER — HYDROCODONE-HOMATROPINE 5-1.5 MG/5ML PO SYRP: 5.0000 mL | ORAL_SOLUTION | ORAL | Status: DC | PRN

## 2013-03-17 MED ORDER — PREDNISONE 20 MG PO TABS: ORAL_TABLET | ORAL | Status: DC

## 2013-03-17 MED ORDER — OSELTAMIVIR PHOSPHATE 75 MG PO CAPS: 75.0000 mg | ORAL_CAPSULE | Freq: Two times a day (BID) | ORAL | Status: DC

## 2013-03-17 MED ORDER — ALBUTEROL SULFATE (2.5 MG/3ML) 0.083% IN NEBU
2.5000 mg | INHALATION_SOLUTION | Freq: Once | RESPIRATORY_TRACT | Status: AC
  Administered 2013-03-17: 2.5 mg via RESPIRATORY_TRACT

## 2013-03-17 NOTE — Patient Instructions (Signed)
This acts like flu and wheezing  Get chest x ray and begine tamiflu and prednosone cough med if needed. Will add antibiotic if  Chest x ray  Shows pna bacterial infectioun

## 2013-03-17 NOTE — Progress Notes (Signed)
Chief Complaint  Patient presents with  . Fever    Started on Friday.  Taking Coricidin OTC.  [name] Cough  . Fatigue  . Otalgia    HPI: Patient comes in today for SDA for  new problem evaluation. Onset 3   days ago .   Low grade temp.    100.2 range Like a ton of bricks hit her.   Husband with similar sx next day but no fever.  Can use MDI racing heart but can do   Nebulizer .  wheezing in   he past months .    coricidin cough and chest .  Sob with actions. No travel out of country  ROS: See pertinent positives and negatives per HPI. No cp som sob no hemoptysis  Inhaler make her shaky so doesn't use them  Wheezing for a month or so.  Husband  Seen today felt viral   Past Medical History  Diagnosis Date  . Hematuria     urethral polyps  . HT (hammer toe)   . Osteoarthritis   . Lichen sclerosus   . GERD (gastroesophageal reflux disease)   . Blood transfusion abn reaction or complication, no procedure mishap   . Urinary incontinence   . Eczema   . Abnormal LFTs     fatty liver on [place] MRI faay liver and hemangioma 2008 biopsy 2011  . Bladder polyps   . Benign tumor   . Colon polyp 2009    Tubulovillous adenoma   . Diverticulosis of colon   . Status post partial lobectomy of lung     for bronchiesctesis  . Asthma     allergy  . Kidney disease     nephritis as  child. polpys, hematuria  . UTI (lower urinary tract infection)   . Cancer     squameous cell lsft leg hx  . Hypertension   . Migraines     hx migraines  . Shortness of breath     partial lower rt lung lobectomy.  . Blood transfusion 1969  . Fatty liver disease, nonalcoholic     confirmed biopsy 2011 mild no fibrosis     Family History  Problem Relation Age of Onset  . Cancer Mother     lung  . Heart disease Mother   . Diabetes Paternal Grandfather   . Cancer Paternal Grandfather     bladder  . Mental retardation Other     History   Social History  . Marital Status: Married    Spouse Name: N/A    Number of Children: N/A  . Years of Education: N/A   Social History Main Topics  . Smoking status: Never Smoker   . Smokeless tobacco: None     Comment: wine q 2-3 weeks  . Alcohol Use: Yes     Comment: 4-5 per year   . Drug Use: No  . Sexual Activity:      Comment: hysterectomy   Other Topics Concern  . 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 cetirizine (ZYRTEC) 10 MG tablet Take 10 mg by mouth daily.    . clobetasol (TEMOVATE) 0.05 % cream Apply 1 application topically 2 (two) times daily as needed. For irritation of skin   .  CRESTOR 10 MG tablet TAKE 1 TABLET DAILY  . hydrochlorothiazide (HYDRODIURIL) 25 MG tablet TAKE 1 TABLET DAILY  . metoprolol succinate (TOPROL-XL) 50 MG 24 hr tablet TAKE 1 TABLET DAILY WITH OR IMMEDIATELY FOLLOWING A MEAL  . NEXIUM 40 MG capsule TAKE 1 CAPSULE DAILY BEFORE BREAKFAST  . fluocinonide ointment (LIDEX) 5.53 % Apply 1 application topically 2 (two) times daily. Not on face  . HYDROcodone-homatropine (HYCODAN) 5-1.5 MG/5ML syrup Take 5 mLs by mouth every 4 (four) hours as needed for cough.  [name] oseltamivir (TAMIFLU) 75 MG capsule Take 1 capsule (75 mg total) by mouth 2 (two) times daily.  . predniSONE (DELTASONE) 20 MG tablet Take 3 po qd for 2 days then 2 po qd for 3 days,or as directed  . [EXPIRED] albuterol (PROVENTIL) (2.5 MG/3ML) 0.083% nebulizer solution 2.5 mg     EXAM:  BP 144/80  Pulse 96  Temp(Src) 99.1 F (37.3 C) (Oral)  Ht 5\' 3"  (1.6 m)  Wt 167 lb (75.751 kg)  BMI 29.59 kg/m2  SpO2 98%  Body mass index is 29.59 kg/(m^2). WDWN in NAD  quiet respirations; mildly congested  somewhat hoarse. Non toxic .intermittent cough  HEENT: Normocephalic ;atraumatic , Eyes;  PERRL, EOMs  Full, lids and conjunctiva clear,,Ears: no  deformities, canals nl, TM landmarks normal,partly obs by wax on left  Nose: no deformity or discharge but congested;face minimally tender Mouth : OP clear without lesion or edema .mild redness  Neck: Supple without adenopathy or masses or bruits Chest:   Well healed scar  Right  Diffuse exp wheeze no rhonchi rales  CV:  S1-S2 no gallops or murmurs peripheral perfusion is normal Skin :nl perfusion   PSYCH: pleasant and cooperative, no obvious depression or anxiety  ASSESSMENT AND PLAN:  Discussed the following assessment and plan:  Flu-like symptoms - tamiflu although lower end of help she is high risk  - Plan: DG Chest 2 View  Wheezing - inc air after neb.  predates fever illness - Plan: DG Chest 2 View, albuterol (PROVENTIL) (2.5 MG/3ML) 0.083% nebulizer solution 2.5 mg  Status post partial lobectomy of lungAT THE 64 HOURS MARK but has risk  asthma and hx bronchiectasis and lobectomy  -Patient advised to return or notify health care team  if symptoms worsen or persist or new concerns arise.  Patient Instructions  This acts like flu and wheezing  Get chest x ray and begine tamiflu and prednosone cough med if needed. Will add antibiotic if  Chest x ray  Shows pna bacterial infectioun   Mariann Laster K. Saralynn Langhorst M.D.  Pre visit review using our clinic review tool, if applicable. No additional management support is needed unless otherwise documented below in the visit note. X ray no acute changes or pna  To use tamiflu and pred

## 2013-04-03 DIAGNOSIS — L940 Localized scleroderma [morphea]: Secondary | ICD-10-CM | POA: Diagnosis not present

## 2013-04-03 DIAGNOSIS — Z124 Encounter for screening for malignant neoplasm of cervix: Secondary | ICD-10-CM | POA: Diagnosis not present

## 2013-04-15 ENCOUNTER — Encounter: Payer: Self-pay | Admitting: Internal Medicine

## 2013-04-15 ENCOUNTER — Ambulatory Visit (INDEPENDENT_AMBULATORY_CARE_PROVIDER_SITE_OTHER): Payer: Medicare Other | Admitting: Internal Medicine

## 2013-04-15 VITALS — BP 140/80 | HR 77 | Temp 98.7°F | Ht 63.0 in | Wt 167.0 lb

## 2013-04-15 DIAGNOSIS — Z87448 Personal history of other diseases of urinary system: Secondary | ICD-10-CM

## 2013-04-15 DIAGNOSIS — Z23 Encounter for immunization: Secondary | ICD-10-CM

## 2013-04-15 DIAGNOSIS — D414 Neoplasm of uncertain behavior of bladder: Secondary | ICD-10-CM

## 2013-04-15 DIAGNOSIS — I10 Essential (primary) hypertension: Secondary | ICD-10-CM

## 2013-04-15 DIAGNOSIS — L940 Localized scleroderma [morphea]: Secondary | ICD-10-CM

## 2013-04-15 DIAGNOSIS — R7309 Other abnormal glucose: Secondary | ICD-10-CM

## 2013-04-15 DIAGNOSIS — L900 Lichen sclerosus et atrophicus: Secondary | ICD-10-CM

## 2013-04-15 DIAGNOSIS — K7689 Other specified diseases of liver: Secondary | ICD-10-CM

## 2013-04-15 DIAGNOSIS — M1990 Unspecified osteoarthritis, unspecified site: Secondary | ICD-10-CM

## 2013-04-15 DIAGNOSIS — Z0000 Encounter for general adult medical examination without abnormal findings: Secondary | ICD-10-CM | POA: Diagnosis not present

## 2013-04-15 DIAGNOSIS — E785 Hyperlipidemia, unspecified: Secondary | ICD-10-CM

## 2013-04-15 DIAGNOSIS — K760 Fatty (change of) liver, not elsewhere classified: Secondary | ICD-10-CM

## 2013-04-15 DIAGNOSIS — E2839 Other primary ovarian failure: Secondary | ICD-10-CM

## 2013-04-15 LAB — POCT URINALYSIS DIP (MANUAL ENTRY)
BILIRUBIN UA: NEGATIVE
BILIRUBIN UA: NEGATIVE
Glucose, UA: NEGATIVE
LEUKOCYTES UA: NEGATIVE
Nitrite, UA: NEGATIVE
Protein Ur, POC: NEGATIVE
Spec Grav, UA: 1.02
Urobilinogen, UA: 0.2
pH, UA: 6.5

## 2013-04-15 LAB — LIPID PANEL
CHOL/HDL RATIO: 5
Cholesterol: 158 mg/dL (ref 0–200)
HDL: 34.2 mg/dL — AB (ref >39.00)
LDL Cholesterol: 92 mg/dL (ref 0–99)
Triglycerides: 157 mg/dL — ABNORMAL HIGH (ref 0.0–149.0)
VLDL: 31.4 mg/dL (ref 0.0–40.0)

## 2013-04-15 LAB — BASIC METABOLIC PANEL
BUN: 13 mg/dL (ref 6–23)
CO2: 30 meq/L (ref 19–32)
Calcium: 10.1 mg/dL (ref 8.4–10.5)
Chloride: 101 mEq/L (ref 96–112)
Creatinine, Ser: 0.6 mg/dL (ref 0.4–1.2)
GFR: 102.33 mL/min (ref >60.00)
GLUCOSE: 121 mg/dL — AB (ref 70–99)
POTASSIUM: 3.8 meq/L (ref 3.5–5.1)
SODIUM: 138 meq/L (ref 135–145)

## 2013-04-15 LAB — CBC WITH DIFFERENTIAL/PLATELET
BASOS ABS: 0 10*3/uL (ref 0.0–0.1)
BASOS PCT: 0.5 % (ref 0.0–3.0)
Eosinophils Absolute: 0.3 10*3/uL (ref 0.0–0.7)
Eosinophils Relative: 3.1 % (ref 0.0–5.0)
HEMATOCRIT: 42.7 % (ref 36.0–46.0)
HEMOGLOBIN: 14.4 g/dL (ref 12.0–15.0)
LYMPHS ABS: 3.1 10*3/uL (ref 0.7–4.0)
LYMPHS PCT: 34.7 % (ref 12.0–46.0)
MCHC: 33.6 g/dL (ref 30.0–36.0)
MCV: 87.3 fl (ref 78.0–100.0)
MONOS PCT: 6.4 % (ref 3.0–12.0)
Monocytes Absolute: 0.6 10*3/uL (ref 0.1–1.0)
NEUTROS ABS: 4.9 10*3/uL (ref 1.4–7.7)
Neutrophils Relative %: 55.3 % (ref 43.0–77.0)
Platelets: 315 10*3/uL (ref 150.0–400.0)
RBC: 4.89 Mil/uL (ref 3.87–5.11)
RDW: 12.2 % (ref 11.5–14.6)
WBC: 8.9 10*3/uL (ref 4.5–10.5)

## 2013-04-15 LAB — HEPATIC FUNCTION PANEL
ALBUMIN: 4.4 g/dL (ref 3.5–5.2)
ALK PHOS: 53 U/L (ref 39–117)
ALT: 55 U/L — AB (ref 0–35)
AST: 41 U/L — AB (ref 0–37)
BILIRUBIN DIRECT: 0.1 mg/dL (ref 0.0–0.3)
TOTAL PROTEIN: 7.2 g/dL (ref 6.0–8.3)
Total Bilirubin: 0.6 mg/dL (ref 0.3–1.2)

## 2013-04-15 LAB — TSH: TSH: 1.2 u[IU]/mL (ref 0.35–5.50)

## 2013-04-15 LAB — HEMOGLOBIN A1C: Hgb A1c MFr Bld: 6.6 % — ABNORMAL HIGH (ref 4.6–6.5)

## 2013-04-15 NOTE — Patient Instructions (Signed)
Continue lifestyle intervention healthy eating and exercise . Check blood pressure readings to make sure  At goal. Will notify you  of labs when available. Get bone density to check bone health risk.  If all ok yearly check and labs  Bone Health Our bones do many things. They provide structure, protect organs, anchor muscles, and store calcium. Adequate calcium in your diet and weight-bearing physical activity help build strong bones, improve bone amounts, and may reduce the risk of weakening of bones (osteoporosis) later in life. PEAK BONE MASS By age 58, the average woman has acquired most of her skeletal bone mass. A large decline occurs in older adults which increases the risk of osteoporosis. In women this occurs around the time of menopause. It is important for young girls to reach their peak bone mass in order to maintain bone health throughout life. A person with high bone mass as a young adult will be more likely to have a higher bone mass later in life. Not enough calcium consumption and physical activity early on could result in a failure to achieve optimum bone mass in adulthood. OSTEOPOROSIS Osteoporosis is a disease of the bones. It is defined as low bone mass with deterioration of bone structure. Osteoporosis leads to an increase risk of fractures with falls. These fractures commonly happen in the wrist, hip, and spine. While men and women of all ages and background can develop osteoporosis, some of the risk factors for osteoporosis are:  Female.  White.  Postmenopausal.  Older adults.  Small in body size.  Eating a diet low in calcium.  Physically inactive.  Smoking.  Use of some medications.  Family history. CALCIUM Calcium is a mineral needed by the body for healthy bones, teeth, and proper function of the heart, muscles, and nerves. The body cannot produce calcium so it must be absorbed through food. Good sources of calcium include:  Dairy products (low fat or  nonfat milk, cheese, and yogurt).  Dark green leafy vegetables (bok choy and broccoli).  Calcium fortified foods (orange juice, cereal, bread, soy beverages, and tofu products).  Nuts (almonds). Recommended amounts of calcium vary for individuals. RECOMMENDED CALCIUM INTAKES Age and Amount in mg per day  Children 1 to 3 years / 700 mg  Children 4 to 8 years / 1,000 mg  Children 9 to 13 years / 1,300 mg  Teens 14 to 18 years / 1,300 mg  Adults 19 to 50 years / 1,000 mg  Adult women 51 to 70 years / 1,200 mg  Adults 71 years and older / 1,200 mg  Pregnant and breastfeeding teens / 1,300 mg  Pregnant and breastfeeding adults / 1,000 mg Vitamin D also plays an important role in healthy bone development. Vitamin D helps in the absorption of calcium. WEIGHT-BEARING PHYSICAL ACTIVITY Regular physical activity has many positive health benefits. Benefits include strong bones. Weight-bearing physical activity early in life is important in reaching peak bone mass. Weight-bearing physical activities cause muscles and bones to work against gravity. Some examples of weight bearing physical activities include:  Walking, jogging, or running.  [employer].  Jumping rope.  Dancing.  Soccer.  Tennis or Racquetball.  Stair climbing.  Basketball.  Hiking.  Weight lifting.  Aerobic fitness classes. Including weight-bearing physical activity into an exercise plan is a great way to keep bones healthy. Adults: Engage in at least 30 minutes of moderate physical activity on most, preferably all, days of the week. Children: Engage in at least 60 minutes  of moderate physical activity on most, preferably all, days of the week. FOR MORE INFORMATION [place] Web designer, Soil scientist for [employer] and Promotion: www.cnpp.usda.[place]: [employer].ee Document Released: 04/01/2003 Document Revised: 05/06/2012 Document Reviewed:  07/01/2008 [facility] Patient Information 2014 [place], [place].

## 2013-04-15 NOTE — Progress Notes (Signed)
Chief Complaint  Patient presents with  . Medicare Wellness    Pt is fasting.  [name] Hypertension    HPI: Patient comes in today for Preventive Medicare wellness visit . No major injuries, ed visits ,hospitalizations , new medications since last visit. cough much better  Bladder polyps.   No recent .check.   Last week bp 120/80   At [facility]. Lichen sclerosis under rx  Reflux : nexium every other day  Sometimes more if throat clearing.  No family hx of osteopososies.  No fractures ? No bone density   Health Maintenance  Topic Date Due  . Influenza Vaccine  08/23/2013  . Mammogram  08/29/2014  . Colonoscopy  02/17/2016  . Tetanus/tdap  02/12/2022  . Pneumococcal Polysaccharide Vaccine Age 49 And Over  Addressed  . Zostavax  Completed   Health Maintenance Review  Hearing: ok   Vision:  No limitations at present . Last eye check UTD has cataracts   Safety:  Has smoke detector and wears seat belts.  No firearms. No excess sun exposure. Sees dentist regularly.  Falls: no  Advance directive :  Reviewed  Has one.  Memory: Felt to be good  , no concern from her or her family.  Depression: No anhedonia unusual crying or depressive symptoms  Nutrition: Eats well balanced diet; adequate calcium and vitamin D. No swallowing chewing problems.  Injury: no major injuries in the last six months.  Other healthcare providers:  Reviewed today .  Social:  Lives with spouse married. No pets. Travels to [place] [employer] parameters: up-to-date  Reviewed   ADLS:   There are no problems or need for assistance  driving, feeding, obtaining food, dressing, toileting and bathing, managing money using phone. She is independent.  EXERCISE/ HABITS  Per week sometimes walking   No tobacco ,   etoh little, diet coke    ROS:  GEN/ HEENT: No fever, significant weight changes sweats headaches vision problems hearing changes, CV/ PULM; No chest pain shortness of breath cough,  syncope,edema  change in exercise tolerance. GI /GU: No adominal pain, vomiting, change in bowel habits. No blood in the stool. No significant GU symptoms. SKIN/HEME: ,no acute skin rashes suspicious lesions or bleeding. No lymphadenopathy, nodules, masses.  NEURO/ PSYCH:  No neurologic signs such as weakness numbness. No depression anxiety. IMM/ Allergy: No unusual infections.  Allergy .   REST of 12 system review negative except as per HPI   Past Medical History  Diagnosis Date  . Hematuria     urethral polyps  . HT (hammer toe)   . Osteoarthritis   . Lichen sclerosus     gyne care topical steroids  . GERD (gastroesophageal reflux disease)   . Blood transfusion abn reaction or complication, no procedure mishap   . Urinary incontinence   . Eczema   . Abnormal LFTs     fatty liver on [place] MRI faay liver and hemangioma 2008 biopsy 2011  . Bladder polyps   . Benign tumor   . Colon polyp 2009    Tubulovillous adenoma   . Diverticulosis of colon   . Status post partial lobectomy of lung     for bronchiesctesis  . Asthma     allergy  . Kidney disease     nephritis as  child. polpys, hematuria  . UTI (lower urinary tract infection)   . Cancer     squameous cell lsft leg hx  . Hypertension   . Migraines  hx migraines  . Shortness of breath     partial lower rt lung lobectomy.  . Blood transfusion 1969  . Fatty liver disease, nonalcoholic     confirmed biopsy 2011 mild no fibrosis     Family History  Problem Relation Age of Onset  . Cancer Mother     lung  . Heart disease Mother   . Diabetes Paternal Grandfather   . Cancer Paternal Grandfather     bladder  . Mental retardation Other     History   Social History  . Marital Status: Married    Spouse Name: N/A    Number of Children: N/A  . Years of Education: N/A   Social History Main Topics  . Smoking status: Never Smoker   . Smokeless tobacco: None     Comment: wine q 2-3 weeks  . Alcohol Use: Yes      Comment: 4-5 per year   . Drug Use: No  . Sexual Activity:      Comment: hysterectomy   Other Topics Concern  . 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 cetirizine (ZYRTEC) 10 MG tablet Take 10 mg by mouth daily.    . clobetasol (TEMOVATE) 0.05 % cream Apply 1 application topically 2 (two) times daily as needed. For irritation of skin   . CRESTOR 10 MG tablet TAKE 1 TABLET DAILY  . hydrochlorothiazide (HYDRODIURIL) 25 MG tablet TAKE 1 TABLET DAILY  . metoprolol succinate (TOPROL-XL) 50 MG 24 hr tablet TAKE 1 TABLET DAILY WITH OR IMMEDIATELY FOLLOWING A MEAL  . NEXIUM 40 MG capsule TAKE 1 CAPSULE DAILY BEFORE BREAKFAST  . [DISCONTINUED] fluocinonide ointment (LIDEX) 9.52 % Apply 1 application topically 2 (two) times daily. Not on face  . [DISCONTINUED] HYDROcodone-homatropine (HYCODAN) 5-1.5 MG/5ML syrup Take 5 mLs by mouth every 4 (four) hours as needed for cough.  . [DISCONTINUED] oseltamivir (TAMIFLU) 75 MG capsule Take 1 capsule (75 mg total) by mouth 2 (two) times daily.  . [DISCONTINUED] predniSONE (DELTASONE) 20 MG tablet Take 3 po qd for 2 days then 2 po qd for 3 days,or as directed    EXAM:  BP 140/80  Pulse 77  Temp(Src) 98.7 F (37.1 C) (Oral)  Ht 5\' 3"  (1.6 m)  Wt 167 lb (75.751 kg)  BMI 29.59 kg/m2  SpO2 98%  Body mass index is 29.59 kg/(m^2).  Physical Exam: Vital signs reviewed WUX:LKGM is a well-developed well-nourished alert cooperative   who appears stated age in no acute distress.  HEENT: normocephalic atraumatic , Eyes: PERRL EOM's full, conjunctiva clear, Nares: paten,t no deformity discharge or tenderness., Ears: no deformity EAC's clear TMs with normal landmarks. Mouth: clear OP, no lesions, edema.  Moist mucous membranes. Dentition in adequate  repair. NECK: supple without masses, thyromegaly or bruits. CHEST/PULM:  Clear to auscultation and percussion breath sounds equal no wheeze , rales or rhonchi. No chest wall deformities or tenderness. Breast: normal by inspection . No dimpling, discharge, masses, tenderness or discharge . CV: PMI is nondisplaced, S1 S2 no gallops, murmurs, rubs. Peripheral pulses are full without delay.No JVD .  ABDOMEN: Bowel sounds normal nontender  No guard or rebound, no hepato splenomegal no CVA tenderness.  No hernia. Extremtities:  No clubbing cyanosis or  edema, no acute joint swelling or redness no focal atrophy NEURO:  Oriented x3, cranial nerves 3-12 appear to be intact, no obvious focal weakness,gait within normal limits no abnormal reflexes or asymmetrical SKIN: No acute rashes normal turgor, color, no bruising or petechiae. PSYCH: Oriented, good eye contact, no obvious depression anxiety, cognition and judgment appear normal. LN: no cervical axillary inguinal adenopathy No noted deficits in memory, attention, and speech.    ASSESSMENT AND PLAN:  Discussed the following assessment and plan:  Medicare annual wellness visit, subsequent  Unspecified essential hypertension - Plan: Basic metabolic panel, CBC with Differential, Hemoglobin A1c, Hepatic function panel, Lipid panel, TSH, POCT urinalysis dipstick  Fatty liver disease, nonalcoholic - Plan: Basic metabolic panel, CBC with Differential, Hemoglobin A1c, Hepatic function panel, Lipid panel, TSH, POCT urinalysis dipstick  Osteoarthrosis, unspecified whether generalized or localized, unspecified site - Plan: Basic metabolic panel, CBC with Differential, Hemoglobin A1c, Hepatic function panel, Lipid panel, TSH, POCT urinalysis dipstick  Other and unspecified hyperlipidemia - Plan: Basic metabolic panel, CBC with Differential, Hemoglobin A1c, Hepatic function panel, Lipid panel, TSH, POCT urinalysis dipstick  Other abnormal glucose - Plan:  Basic metabolic panel, CBC with Differential, Hemoglobin A1c, Hepatic function panel, Lipid panel, TSH, POCT urinalysis dipstick  Estrogen deficiency - Plan: DG Bone Density  Lichen sclerosus  Need for vaccination with 13-polyvalent pneumococcal conjugate vaccine - Plan: Pneumococcal conjugate vaccine 13-valent  HEMATURIA, MICROSCOPIC, HX OF Counseled regarding healthy nutrition, exercise, sleep, injury prevention, calcium vit d and healthy weight . prevnar 13 today Patient Care Team: Burnis Medin, MD as PCP - General Erline Levine, MD as Attending Physician (Neurosurgery) Logan Bores, MD as Attending Physician (Obstetrics and Gynecology) Gatha Mayer, MD as Attending Physician (Gastroenterology) Bonnita Levan, MD as Consulting Physician (Dermatology)  Patient Instructions  Continue lifestyle intervention healthy eating and exercise . Check blood pressure readings to make sure  At goal. Will notify you  of labs when available. Get bone density to check bone health risk.  If all ok yearly check and labs  Bone Health Our bones do many things. They provide structure, protect organs, anchor muscles, and store calcium. Adequate calcium in your diet and weight-bearing physical activity help build strong bones, improve bone amounts, and may reduce the risk of weakening of bones (osteoporosis) later in life. PEAK BONE MASS By age 80, the average woman has acquired most of her skeletal bone mass. A large decline occurs in older adults which increases the risk of osteoporosis. In women this occurs around the time of menopause. It is important for young girls to reach their peak bone mass in order to maintain bone health throughout life. A person with high bone mass as a young adult will be more likely to have a higher bone mass later in life. Not enough calcium consumption and physical activity early on could result in a failure to achieve optimum bone mass in  adulthood. OSTEOPOROSIS Osteoporosis is a disease of the bones. It is defined as low bone mass with deterioration of bone structure. Osteoporosis leads to an increase risk of fractures with falls. These fractures commonly happen in the wrist, hip, and spine. While men and women of all ages and background can develop osteoporosis, some of the risk factors for osteoporosis are:  Female.  White.  Postmenopausal.  Older adults.  Small in body size.  Eating a diet low in calcium.  Physically inactive.  Smoking.  Use of some medications.  Family history. CALCIUM Calcium  is a mineral needed by the body for healthy bones, teeth, and proper function of the heart, muscles, and nerves. The body cannot produce calcium so it must be absorbed through food. Good sources of calcium include:  Dairy products (low fat or nonfat milk, cheese, and yogurt).  Dark green leafy vegetables (bok choy and broccoli).  Calcium fortified foods (orange juice, cereal, bread, soy beverages, and tofu products).  Nuts (almonds). Recommended amounts of calcium vary for individuals. RECOMMENDED CALCIUM INTAKES Age and Amount in mg per day  Children 1 to 3 years / 700 mg  Children 4 to 8 years / 1,000 mg  Children 9 to 13 years / 1,300 mg  Teens 14 to 18 years / 1,300 mg  Adults 19 to 50 years / 1,000 mg  Adult women 51 to 70 years / 1,200 mg  Adults 71 years and older / 1,200 mg  Pregnant and breastfeeding teens / 1,300 mg  Pregnant and breastfeeding adults / 1,000 mg Vitamin D also plays an important role in healthy bone development. Vitamin D helps in the absorption of calcium. WEIGHT-BEARING PHYSICAL ACTIVITY Regular physical activity has many positive health benefits. Benefits include strong bones. Weight-bearing physical activity early in life is important in reaching peak bone mass. Weight-bearing physical activities cause muscles and bones to work against gravity. Some examples of weight  bearing physical activities include:  Walking, jogging, or running.  [employer].  Jumping rope.  Dancing.  Soccer.  Tennis or Racquetball.  Stair climbing.  Basketball.  Hiking.  Weight lifting.  Aerobic fitness classes. Including weight-bearing physical activity into an exercise plan is a great way to keep bones healthy. Adults: Engage in at least 30 minutes of moderate physical activity on most, preferably all, days of the week. Children: Engage in at least 60 minutes of moderate physical activity on most, preferably all, days of the week. FOR MORE INFORMATION [place] Web designer, Soil scientist for [employer] and Promotion: www.cnpp.usda.[place]: EquipmentWeekly.com.ee Document Released: 04/01/2003 Document Revised: 05/06/2012 Document Reviewed: 07/01/2008 [facility] Patient Information 2014 [place], [place].     Standley Brooking. Marcellino Fidalgo M.D.  Pre visit review using our clinic review tool, if applicable. No additional management support is needed unless otherwise documented below in the visit note. Lab Results  Component Value Date   WBC 8.9 04/15/2013   HGB 14.4 04/15/2013   HCT 42.7 04/15/2013   PLT 315.0 04/15/2013   GLUCOSE 121* 04/15/2013   CHOL 158 04/15/2013   TRIG 157.0* 04/15/2013   HDL 34.20* 04/15/2013   LDLDIRECT 201.9 10/26/2008   LDLCALC 92 04/15/2013   ALT 55* 04/15/2013   AST 41* 04/15/2013   NA 138 04/15/2013   K 3.8 04/15/2013   CL 101 04/15/2013   CREATININE 0.6 04/15/2013   BUN 13 04/15/2013   CO2 30 04/15/2013   TSH 1.20 04/15/2013   INR 0.96 01/29/2009   HGBA1C 6.6* 04/15/2013

## 2013-04-16 ENCOUNTER — Telehealth: Payer: Self-pay | Admitting: Internal Medicine

## 2013-04-16 ENCOUNTER — Ambulatory Visit (INDEPENDENT_AMBULATORY_CARE_PROVIDER_SITE_OTHER)
Admission: RE | Admit: 2013-04-16 | Discharge: 2013-04-16 | Disposition: A | Discharge status: Home / Self Care | Payer: Medicare Other | Source: Ambulatory Visit | Attending: Internal Medicine | Admitting: Internal Medicine

## 2013-04-16 DIAGNOSIS — E2839 Other primary ovarian failure: Secondary | ICD-10-CM

## 2013-04-16 NOTE — Telephone Encounter (Signed)
Relevant patient education assigned to patient using Emmi. ° °

## 2013-04-24 ENCOUNTER — Encounter: Payer: Self-pay | Admitting: Internal Medicine

## 2013-04-24 DIAGNOSIS — M8580 Other specified disorders of bone density and structure, unspecified site: Secondary | ICD-10-CM | POA: Insufficient documentation

## 2013-05-26 DIAGNOSIS — H5200 Hypermetropia, unspecified eye: Secondary | ICD-10-CM | POA: Diagnosis not present

## 2013-05-26 DIAGNOSIS — H2510 Age-related nuclear cataract, unspecified eye: Secondary | ICD-10-CM | POA: Diagnosis not present

## 2013-05-27 DIAGNOSIS — L851 Acquired keratosis [keratoderma] palmaris et plantaris: Secondary | ICD-10-CM | POA: Diagnosis not present

## 2013-05-27 DIAGNOSIS — L819 Disorder of pigmentation, unspecified: Secondary | ICD-10-CM | POA: Diagnosis not present

## 2013-05-27 DIAGNOSIS — D239 Other benign neoplasm of skin, unspecified: Secondary | ICD-10-CM | POA: Diagnosis not present

## 2013-05-27 DIAGNOSIS — D219 Benign neoplasm of connective and other soft tissue, unspecified: Secondary | ICD-10-CM | POA: Diagnosis not present

## 2013-05-27 DIAGNOSIS — L820 Inflamed seborrheic keratosis: Secondary | ICD-10-CM | POA: Diagnosis not present

## 2013-05-27 DIAGNOSIS — Z85828 Personal history of other malignant neoplasm of skin: Secondary | ICD-10-CM | POA: Diagnosis not present

## 2013-05-27 DIAGNOSIS — D485 Neoplasm of uncertain behavior of skin: Secondary | ICD-10-CM | POA: Diagnosis not present

## 2013-05-27 DIAGNOSIS — L570 Actinic keratosis: Secondary | ICD-10-CM | POA: Diagnosis not present

## 2013-05-27 DIAGNOSIS — L821 Other seborrheic keratosis: Secondary | ICD-10-CM | POA: Diagnosis not present

## 2013-06-05 ENCOUNTER — Other Ambulatory Visit: Payer: Self-pay

## 2013-06-05 ENCOUNTER — Other Ambulatory Visit: Payer: Self-pay | Admitting: Internal Medicine

## 2013-06-05 DIAGNOSIS — Z1231 Encounter for screening mammogram for malignant neoplasm of breast: Secondary | ICD-10-CM

## 2013-08-11 ENCOUNTER — Other Ambulatory Visit: Payer: Medicare Other

## 2013-08-12 ENCOUNTER — Other Ambulatory Visit (INDEPENDENT_AMBULATORY_CARE_PROVIDER_SITE_OTHER): Payer: Medicare Other

## 2013-08-12 DIAGNOSIS — D1803 Hemangioma of intra-abdominal structures: Secondary | ICD-10-CM

## 2013-08-12 DIAGNOSIS — R7301 Impaired fasting glucose: Secondary | ICD-10-CM

## 2013-08-12 LAB — HEPATIC FUNCTION PANEL
ALK PHOS: 49 U/L (ref 39–117)
ALT: 27 U/L (ref 0–35)
AST: 24 U/L (ref 0–37)
Albumin: 4.1 g/dL (ref 3.5–5.2)
BILIRUBIN DIRECT: 0.1 mg/dL (ref 0.0–0.3)
BILIRUBIN TOTAL: 0.9 mg/dL (ref 0.2–1.2)
Total Protein: 6.6 g/dL (ref 6.0–8.3)

## 2013-08-12 LAB — HEMOGLOBIN A1C: Hgb A1c MFr Bld: 6.1 % (ref 4.6–6.5)

## 2013-08-18 ENCOUNTER — Encounter: Payer: Self-pay | Admitting: Internal Medicine

## 2013-08-18 ENCOUNTER — Ambulatory Visit (INDEPENDENT_AMBULATORY_CARE_PROVIDER_SITE_OTHER): Payer: Medicare Other | Admitting: Internal Medicine

## 2013-08-18 VITALS — BP 140/78 | Temp 98.4°F | Ht 63.0 in | Wt 152.0 lb

## 2013-08-18 DIAGNOSIS — R7309 Other abnormal glucose: Secondary | ICD-10-CM

## 2013-08-18 DIAGNOSIS — K7689 Other specified diseases of liver: Secondary | ICD-10-CM | POA: Diagnosis not present

## 2013-08-18 DIAGNOSIS — R7303 Prediabetes: Secondary | ICD-10-CM | POA: Insufficient documentation

## 2013-08-18 DIAGNOSIS — I10 Essential (primary) hypertension: Secondary | ICD-10-CM | POA: Diagnosis not present

## 2013-08-18 DIAGNOSIS — K760 Fatty (change of) liver, not elsewhere classified: Secondary | ICD-10-CM

## 2013-08-18 NOTE — Patient Instructions (Signed)
Continue lifestyle intervention healthy eating and exercise . Keep going.  Wt Readings from Last 3 Encounters:  08/18/13 152 lb (68.947 kg)  04/15/13 167 lb (75.751 kg)  03/17/13 167 lb (75.751 kg)   Try to get some exercise   Cross train.  Work on taking bp medication every day. To optimize control.  Take blood pressure readings twice a day for 7- 10 days and then periodically .To ensure below 140/90   .Send in readings      Plan preventive  Visit next march and will do labs at that visit to include hgb a1c  .

## 2013-08-18 NOTE — Progress Notes (Signed)
Pre visit review using our clinic review tool, if applicable. No additional management support is needed unless otherwise documented below in the visit note.  Chief Complaint  Patient presents with  . Follow-up    HPI: FU  Blood glucose and liver tests  Has initiated lsi since noting blood sugar elevation.   Has lost weight feels good right foot limiting with exercise  recent travel  Cut out breads and  Potatoes some  and except for travel doing better  Doing lots of fruit. Though .  Feels good  Husband also  Exercising  ROS: See pertinent positives and negatives per HPI. No cp sob  New sx   Past Medical History  Diagnosis Date  . Hematuria     urethral polyps  . HT (hammer toe)   . Osteoarthritis   . Lichen sclerosus     gyne care topical steroids  . GERD (gastroesophageal reflux disease)   . Blood transfusion abn reaction or complication, no procedure mishap   . Urinary incontinence   . Eczema   . Abnormal LFTs     fatty liver on [place] MRI faay liver and hemangioma 2008 biopsy 2011  . Bladder polyps   . Benign tumor   . Colon polyp 2009    Tubulovillous adenoma   . Diverticulosis of colon   . Status post partial lobectomy of lung     for bronchiesctesis  . Asthma     allergy  . Kidney disease     nephritis as  child. polpys, hematuria  . UTI (lower urinary tract infection)   . Cancer     squameous cell lsft leg hx  . Hypertension   . Migraines     hx migraines  . Shortness of breath     partial lower rt lung lobectomy.  . Blood transfusion 1969  . Fatty liver disease, nonalcoholic     confirmed biopsy 2011 mild no fibrosis     Family History  Problem Relation Age of Onset  . Cancer Mother     lung  . Heart disease Mother   . Diabetes Paternal Grandfather   . Cancer Paternal Grandfather     bladder  . Mental retardation Other     History   Social History  . Marital Status: Married    Spouse Name: N/A    Number of Children: N/A  . Years of Education:  N/A   Social History Main Topics  . Smoking status: Never Smoker   . Smokeless tobacco: None     Comment: wine q 2-3 weeks  . Alcohol Use: Yes     Comment: 4-5 per year   . Drug Use: No  . Sexual Activity:      Comment: hysterectomy   Other Topics Concern  . 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 cetirizine (ZYRTEC) 10 MG tablet Take 10 mg by mouth daily.    . clobetasol (TEMOVATE) 0.05 % cream Apply 1 application topically 2 (two) times daily as needed. For irritation of skin   . CRESTOR 10 MG tablet TAKE 1 TABLET DAILY  . hydrochlorothiazide (HYDRODIURIL) 25 MG tablet TAKE 1 TABLET DAILY  . metoprolol succinate (TOPROL-XL) 50 MG 24 hr tablet TAKE 1 TABLET DAILY WITH  OR IMMEDIATELY FOLLOWING A MEAL  . NEXIUM 40 MG capsule TAKE 1 CAPSULE DAILY BEFORE BREAKFAST    EXAM:  BP 140/78  Temp(Src) 98.4 F (36.9 C) (Oral)  Ht 5\' 3"  (1.6 m)  Wt 152 lb (68.947 kg)  BMI 26.93 kg/m2  Body mass index is 26.93 kg/(m^2). Wt Readings from Last 3 Encounters:  08/18/13 152 lb (68.947 kg)  04/15/13 167 lb (75.751 kg)  03/17/13 167 lb (75.751 kg)    GENERAL: vitals reviewed and listed above, alert, oriented, appears well hydrated and in no acute distress HEENT: atraumatic, conjunctiva  clear, no obvious abnormalities on inspection of external nose and ears NECK: no obvious masses on inspection palpation  LUNGS: clear to auscultation bilaterally, no wheezes, rales or rhonchi, good air movement CV: HRRR, no clubbing cyanosis or  peripheral edema nl cap refill  PSYCH: pleasant and cooperative, no obvious depression or anxiety Lab Results  Component Value Date   WBC 8.9 04/15/2013   HGB 14.4 04/15/2013   HCT 42.7 04/15/2013   PLT 315.0 04/15/2013   GLUCOSE 121* 04/15/2013   CHOL 158  04/15/2013   TRIG 157.0* 04/15/2013   HDL 34.20* 04/15/2013   LDLDIRECT 201.9 10/26/2008   LDLCALC 92 04/15/2013   ALT 27 08/12/2013   AST 24 08/12/2013   NA 138 04/15/2013   K 3.8 04/15/2013   CL 101 04/15/2013   CREATININE 0.6 04/15/2013   BUN 13 04/15/2013   CO2 30 04/15/2013   TSH 1.20 04/15/2013   INR 0.96 01/29/2009   HGBA1C 6.1 08/12/2013   Body mass index is 26.93 kg/(m^2).  ASSESSMENT AND PLAN:  Discussed the following assessment and plan:  Pre-diabetes - vs early dm  better  levels today continue lsi and weigh loss   encouraged to continue!  Fatty liver disease, nonalcoholic - improved  lfts  prob from [place] - not taking med regualry forgetting will plan on this and send kin readings cont lsi  Next wellness  3 24 16  or after labs at visit   Doing great   Encouraged to continue checlk bp readings to ensure control  -Patient advised to return or notify health care team  if symptoms worsen ,persist or new concerns arise.  Patient Instructions   Continue lifestyle intervention healthy eating and exercise . Keep going.  Wt Readings from Last 3 Encounters:  08/18/13 152 lb (68.947 kg)  04/15/13 167 lb (75.751 kg)  03/17/13 167 lb (75.751 kg)   Try to get some exercise   Cross train.  Work on taking bp medication every day. To optimize control.  Take blood pressure readings twice a day for 7- 10 days and then periodically .To ensure below 140/90   .Send in readings      Plan preventive  Visit next march and will do labs at that visit to include hgb a1c  .       Standley Brooking. Ramil Edgington M.D.

## 2013-09-04 ENCOUNTER — Ambulatory Visit: Payer: Medicare Other

## 2013-09-10 ENCOUNTER — Ambulatory Visit
Admission: RE | Admit: 2013-09-10 | Discharge: 2013-09-10 | Disposition: A | Discharge status: Home / Self Care | Payer: Medicare Other | Source: Ambulatory Visit

## 2013-09-10 DIAGNOSIS — Z1231 Encounter for screening mammogram for malignant neoplasm of breast: Secondary | ICD-10-CM | POA: Diagnosis not present

## 2013-11-05 DIAGNOSIS — Z23 Encounter for immunization: Secondary | ICD-10-CM | POA: Diagnosis not present

## 2014-01-09 DIAGNOSIS — L900 Lichen sclerosus et atrophicus: Secondary | ICD-10-CM | POA: Diagnosis not present

## 2014-01-09 DIAGNOSIS — N390 Urinary tract infection, site not specified: Secondary | ICD-10-CM | POA: Diagnosis not present

## 2014-02-23 ENCOUNTER — Encounter: Payer: Self-pay | Admitting: Internal Medicine

## 2014-02-23 ENCOUNTER — Ambulatory Visit (INDEPENDENT_AMBULATORY_CARE_PROVIDER_SITE_OTHER): Payer: Medicare Other | Admitting: Internal Medicine

## 2014-02-23 VITALS — BP 134/72 | Temp 98.7°F | Wt 138.9 lb

## 2014-02-23 DIAGNOSIS — N3000 Acute cystitis without hematuria: Secondary | ICD-10-CM | POA: Diagnosis not present

## 2014-02-23 DIAGNOSIS — R300 Dysuria: Secondary | ICD-10-CM

## 2014-02-23 LAB — POCT URINALYSIS DIPSTICK
BILIRUBIN UA: NEGATIVE
Glucose, UA: NEGATIVE
Ketones, UA: NEGATIVE
LEUKOCYTES UA: NEGATIVE
NITRITE UA: POSITIVE
PH UA: 5.5
Protein, UA: NEGATIVE
Spec Grav, UA: 1.02
Urobilinogen, UA: 1

## 2014-02-23 MED ORDER — CIPROFLOXACIN HCL 500 MG PO TABS: 500.0000 mg | ORAL_TABLET | Freq: Two times a day (BID) | ORAL | Status: DC

## 2014-02-23 NOTE — Progress Notes (Signed)
Pre visit review using our clinic review tool, if applicable. No additional management support is needed unless otherwise documented below in the visit note.  Chief Complaint  Patient presents with  . Dysuria    Started on Saturday  . Urinary Urgency    HPI: Patient Madison Erickson  comes in today for SDA for  new problem evaluation. Poss uti sx  Onset 2 days ago  sudeen onset like a uti  No flank pain .  Took azo not today   Last one Slight   Blood when patted self .  No fever flak pain last uti cipro helped .  ROS: See pertinent positives and negatives per HPI.  Past Medical History  Diagnosis Date  . Hematuria     urethral polyps  . HT (hammer toe)   . Osteoarthritis   . Lichen sclerosus     gyne care topical steroids  . GERD (gastroesophageal reflux disease)   . Blood transfusion abn reaction or complication, no procedure mishap   . Urinary incontinence   . Eczema   . Abnormal LFTs     fatty liver on [place] MRI faay liver and hemangioma 2008 biopsy 2011  . Bladder polyps   . Benign tumor   . Colon polyp 2009    Tubulovillous adenoma   . Diverticulosis of colon   . Status post partial lobectomy of lung     for bronchiesctesis  . Asthma     allergy  . Kidney disease     nephritis as  child. polpys, hematuria  . UTI (lower urinary tract infection)   . Cancer     squameous cell lsft leg hx  . Hypertension   . Migraines     hx migraines  . Shortness of breath     partial lower rt lung lobectomy.  . Blood transfusion 1969  . Fatty liver disease, nonalcoholic     confirmed biopsy 2011 mild no fibrosis     Family History  Problem Relation Age of Onset  . Cancer Mother     lung  . Heart disease Mother   . Diabetes Paternal Grandfather   . Cancer Paternal Grandfather     bladder  . Mental retardation Other     History   Social History  . Marital Status: Married    Spouse Name: N/A    Number of Children: N/A  . Years of Education: N/A   Social History Main  Topics  . Smoking status: Never Smoker   . Smokeless tobacco: None     Comment: wine q 2-3 weeks  . Alcohol Use: Yes     Comment: 4-5 per year   . Drug Use: No  . Sexual Activity: None     Comment: hysterectomy   Other Topics Concern  . 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 cetirizine (ZYRTEC) 10 MG tablet Take 10 mg by mouth daily.    . clobetasol (TEMOVATE) 0.05 % cream Apply 1 application topically 2 (two) times daily as needed. For irritation of skin   . CRESTOR 10 MG tablet TAKE 1 TABLET DAILY  . hydrochlorothiazide (HYDRODIURIL) 25 MG tablet TAKE 1 TABLET DAILY  . metoprolol succinate (TOPROL-XL) 50 MG 24 hr tablet TAKE 1  TABLET DAILY WITH OR IMMEDIATELY FOLLOWING A MEAL  . NEXIUM 40 MG capsule TAKE 1 CAPSULE DAILY BEFORE BREAKFAST  . ciprofloxacin (CIPRO) 500 MG tablet Take 1 tablet (500 mg total) by mouth 2 (two) times daily.    EXAM:  BP 134/72 mmHg  Temp(Src) 98.7 F (37.1 C) (Oral)  Wt 138 lb 14.4 oz (63.005 kg)  Body mass index is 24.61 kg/(m^2).  GENERAL: vitals reviewed and listed above, alert, oriented, appears well hydrated and in no acute distress neg flank pain  MS: moves all extremities without noticeable focal  abnormality PSYCH: pleasant and cooperative, no obvious depression or anxiety Wt Readings from Last 3 Encounters:  02/23/14 138 lb 14.4 oz (63.005 kg)  08/18/13 152 lb (68.947 kg)  04/15/13 167 lb (75.751 kg)    ASSESSMENT AND PLAN:  Discussed the following assessment and plan:  Dysuria - Plan: POC Urinalysis Dipstick  Acute cystitis without hematuria Keep wellness appt in march   Risk benefit of medication discussed.  -Patient advised to return or notify health care team  if symptoms worsen ,persist or new concerns arise.  Patient  Instructions  Continue lifestyle intervention healthy eating and exercise . Treat for uti.   Fu if  persistent or progressive      Standley Brooking. Avanni Turnbaugh M.D.

## 2014-02-23 NOTE — Patient Instructions (Addendum)
Continue lifestyle intervention healthy eating and exercise . Treat for uti.   Fu if  persistent or progressive

## 2014-02-23 NOTE — Addendum Note (Signed)
Addended by: Joyce Gross R on: 02/23/2014 04:13 PM   Modules accepted: Orders

## 2014-02-25 LAB — URINE CULTURE
COLONY COUNT: NO GROWTH
ORGANISM ID, BACTERIA: NO GROWTH

## 2014-04-21 ENCOUNTER — Encounter: Payer: Self-pay | Admitting: Internal Medicine

## 2014-04-21 ENCOUNTER — Ambulatory Visit (INDEPENDENT_AMBULATORY_CARE_PROVIDER_SITE_OTHER): Payer: Medicare Other | Admitting: Internal Medicine

## 2014-04-21 VITALS — BP 138/80 | Temp 98.4°F | Ht 63.0 in | Wt 147.9 lb

## 2014-04-21 DIAGNOSIS — E785 Hyperlipidemia, unspecified: Secondary | ICD-10-CM | POA: Diagnosis not present

## 2014-04-21 DIAGNOSIS — Z0000 Encounter for general adult medical examination without abnormal findings: Secondary | ICD-10-CM

## 2014-04-21 DIAGNOSIS — R7301 Impaired fasting glucose: Secondary | ICD-10-CM | POA: Diagnosis not present

## 2014-04-21 DIAGNOSIS — I10 Essential (primary) hypertension: Secondary | ICD-10-CM

## 2014-04-21 LAB — POCT URINALYSIS DIP (MANUAL ENTRY)
Bilirubin, UA: NEGATIVE
Glucose, UA: NEGATIVE
Ketones, POC UA: NEGATIVE
Leukocytes, UA: NEGATIVE
Nitrite, UA: NEGATIVE
Protein Ur, POC: NEGATIVE
Spec Grav, UA: 1.015
UROBILINOGEN UA: 0.2
pH, UA: 7

## 2014-04-21 LAB — CBC WITH DIFFERENTIAL/PLATELET
BASOS PCT: 0.8 % (ref 0.0–3.0)
Basophils Absolute: 0.1 10*3/uL (ref 0.0–0.1)
EOS PCT: 2.4 % (ref 0.0–5.0)
Eosinophils Absolute: 0.2 10*3/uL (ref 0.0–0.7)
HCT: 41.6 % (ref 36.0–46.0)
Hemoglobin: 14.3 g/dL (ref 12.0–15.0)
LYMPHS ABS: 2.6 10*3/uL (ref 0.7–4.0)
Lymphocytes Relative: 30.3 % (ref 12.0–46.0)
MCHC: 34.5 g/dL (ref 30.0–36.0)
MCV: 86.1 fl (ref 78.0–100.0)
Monocytes Absolute: 0.6 10*3/uL (ref 0.1–1.0)
Monocytes Relative: 6.5 % (ref 3.0–12.0)
Neutro Abs: 5.1 10*3/uL (ref 1.4–7.7)
Neutrophils Relative %: 60 % (ref 43.0–77.0)
Platelets: 247 10*3/uL (ref 150.0–400.0)
RBC: 4.83 Mil/uL (ref 3.87–5.11)
RDW: 12.1 % (ref 11.5–15.5)
WBC: 8.6 10*3/uL (ref 4.0–10.5)

## 2014-04-21 LAB — LIPID PANEL
CHOL/HDL RATIO: 4
Cholesterol: 132 mg/dL (ref 0–200)
HDL: 35 mg/dL — AB (ref >39.00)
LDL CALC: 75 mg/dL (ref 0–99)
NONHDL: 97
Triglycerides: 111 mg/dL (ref 0.0–149.0)
VLDL: 22.2 mg/dL (ref 0.0–40.0)

## 2014-04-21 LAB — HEPATIC FUNCTION PANEL
ALBUMIN: 4.4 g/dL (ref 3.5–5.2)
ALT: 14 U/L (ref 0–35)
AST: 17 U/L (ref 0–37)
Alkaline Phosphatase: 47 U/L (ref 39–117)
BILIRUBIN DIRECT: 0.1 mg/dL (ref 0.0–0.3)
Total Bilirubin: 0.5 mg/dL (ref 0.2–1.2)
Total Protein: 7.1 g/dL (ref 6.0–8.3)

## 2014-04-21 LAB — BASIC METABOLIC PANEL
BUN: 12 mg/dL (ref 6–23)
CHLORIDE: 104 meq/L (ref 96–112)
CO2: 30 mEq/L (ref 19–32)
Calcium: 10.1 mg/dL (ref 8.4–10.5)
Creatinine, Ser: 0.63 mg/dL (ref 0.40–1.20)
GFR: 98.32 mL/min (ref >60.00)
Glucose, Bld: 102 mg/dL — ABNORMAL HIGH (ref 70–99)
Potassium: 3.7 mEq/L (ref 3.5–5.1)
Sodium: 139 mEq/L (ref 135–145)

## 2014-04-21 LAB — TSH: TSH: 2.17 u[IU]/mL (ref 0.35–4.50)

## 2014-04-21 LAB — HEMOGLOBIN A1C: Hgb A1c MFr Bld: 5.8 % (ref 4.6–6.5)

## 2014-04-21 NOTE — Patient Instructions (Addendum)
Continue lifestyle intervention healthy eating and exercise . Flu vaccine vaccine yearly . Will notify you  of labs when available. See urology routine as  Discussed .  If all ok yearly wellness visit  And labs

## 2014-04-21 NOTE — Progress Notes (Signed)
Pre visit review using our clinic review tool, if applicable. No additional management support is needed unless otherwise documented below in the visit note.  Chief Complaint  Patient presents with  . Medicare Wellness    hyperglycemia  . Hypertension  . Hyperlipidemia    HPI: Patient comes in today for Preventive Medicare wellness visit . No major injuries, ed visits ,hospitalizations , new medications since last visit. sdidint gain weight scale wrong last time usually aobut 145 or so continuings lsi  Feels good takign meds  To see new urologist  Soon dr Jeffie Pollock  Health Maintenance  Topic Date Due  . PNA vac Low Risk Adult (2 of 2 - PPSV23) 04/16/2014  . INFLUENZA VACCINE  08/24/2014  . MAMMOGRAM  09/11/2015  . COLONOSCOPY  02/17/2016  . TETANUS/TDAP  02/12/2022  . DEXA SCAN  Completed  . ZOSTAVAX  Completed   Health Maintenance Review LIFESTYLE:  Exercise:   Active not formal  Tobacco/ETS: no Alcohol:   ocass Sugar beverages: ocass  Sleep: 7-8  Drug use: no Bone density:  2015  Osteopenia  Colonoscopy:  utd due 1-2 years   MEDICARE DOCUMENT QUESTIONS  TO SCAN   Hearing: ok  Vision:  No limitations at present . Last eye check UTD  Safety:  Has smoke detector and wears seat belts.  No firearms. No excess sun exposure. Sees dentist regularly.  Falls:   Advance directive :  Reviewed  Has one   Memory: Felt to be good  , no concern from her or her family.  Depression: No anhedonia unusual crying or depressive symptoms  Nutrition: Eats well balanced diet; adequate calcium and vitamin D. No swallowing chewing problems.  Injury: no major injuries in the last six months.  Other healthcare providers:  Reviewed today .  Social:  Lives with spouse married. No pets.   Preventive parameters: up-to-date  Reviewed   ADLS:   There are no problems or need for assistance  driving, feeding, obtaining food, dressing, toileting and bathing, managing money using phone. She  is independent.  ROS:  GEN/ HEENT: No fever, significant weight changes sweats headaches vision problems hearing changes, CV/ PULM; No chest pain shortness of breath cough, syncope,edema  change in exercise tolerance. GI /GU: No adominal pain, vomiting, change in bowel habits. No blood in the stool. No significant GU symptoms. SKIN/HEME: ,no acute skin rashes suspicious lesions or bleeding. No lymphadenopathy, nodules, masses.  NEURO/ PSYCH:  No neurologic signs such as weakness numbness. No depression anxiety. IMM/ Allergy: No unusual infections.  Allergy .   REST of 12 system review negative except as per HPI   Past Medical History  Diagnosis Date  . Hematuria     urethral polyps  . HT (hammer toe)   . Osteoarthritis   . Lichen sclerosus     gyne care topical steroids  . GERD (gastroesophageal reflux disease)   . Blood transfusion abn reaction or complication, no procedure mishap   . Urinary incontinence   . Eczema   . Abnormal LFTs     fatty liver on [place] MRI faay liver and hemangioma 2008 biopsy 2011  . Bladder polyps   . Benign tumor   . Colon polyp 2009    Tubulovillous adenoma   . Diverticulosis of colon   . Status post partial lobectomy of lung     for bronchiesctesis  . Asthma     allergy  . Kidney disease     nephritis as  child. polpys,  hematuria  . UTI (lower urinary tract infection)   . Cancer     squameous cell lsft leg hx  . Hypertension   . Migraines     hx migraines  . Shortness of breath     partial lower rt lung lobectomy.  . Blood transfusion 1969  . Fatty liver disease, nonalcoholic     confirmed biopsy 2011 mild no fibrosis     Family History  Problem Relation Age of Onset  . Cancer Mother     lung  . Heart disease Mother   . Diabetes Paternal Grandfather   . Cancer Paternal Grandfather     bladder  . Mental retardation Other     History   Social History  . Marital Status: Married    Spouse Name: N/A  . Number of Children: N/A  .  Years of Education: N/A   Social History Main Topics  . Smoking status: Never Smoker   . Smokeless tobacco: Not on file     Comment: wine q 2-3 weeks  . Alcohol Use: Yes     Comment: 4-5 per year   . Drug Use: No  . Sexual Activity: Not on file     Comment: hysterectomy   Other Topics Concern  . 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 cetirizine (ZYRTEC) 10 MG tablet Take 10 mg by mouth daily.    . clobetasol (TEMOVATE) 0.05 % cream Apply 1 application topically 2 (two) times daily as needed. For irritation of skin   . CRESTOR 10 MG tablet TAKE 1 TABLET DAILY  . hydrochlorothiazide (HYDRODIURIL) 25 MG tablet TAKE 1 TABLET DAILY  . metoprolol succinate (TOPROL-XL) 50 MG 24 hr tablet TAKE 1 TABLET DAILY WITH OR IMMEDIATELY FOLLOWING A MEAL  . NEXIUM 40 MG capsule TAKE 1 CAPSULE DAILY BEFORE BREAKFAST  . [DISCONTINUED] ciprofloxacin (CIPRO) 500 MG tablet Take 1 tablet (500 mg total) by mouth 2 (two) times daily.    EXAM:  BP 138/80 mmHg  Temp(Src) 98.4 F (36.9 C) (Oral)  Ht 5\' 3"  (1.6 m)  Wt 147 lb 14.4 oz (67.087 kg)  BMI 26.21 kg/m2  Body mass index is 26.21 kg/(m^2).  Physical Exam: Vital signs reviewed DVV:OHYW is a well-developed well-nourished alert cooperative   who appears stated age in no acute distress.  HEENT: normocephalic atraumatic , Eyes: PERRL EOM's full, conjunctiva clear, Nares: paten,t no deformity discharge or tenderness., Ears: no deformity EAC's clear TMs with normal landmarks. Mouth: clear OP, no lesions, edema.  Moist mucous membranes. Dentition in adequate repair. NECK: supple without masses, thyromegaly or bruits. CHEST/PULM:  Clear to auscultation and percussion breath sounds equal no wheeze , rales or rhonchi. No chest wall deformities or  tenderness. CV: PMI is nondisplaced, S1 S2 no gallops, murmurs, rubs. Peripheral pulses are full without delay.No JVD .  Breast: normal by inspection . No dimpling, discharge, masses, tenderness or discharge . ABDOMEN: Bowel sounds normal nontender  No guard or rebound, no hepato splenomegal no CVA tenderness.  No hernia. Extremtities:  No clubbing cyanosis or edema, no acute joint swelling or redness no focal atrophy NEURO:  Oriented x3, cranial nerves 3-12 appear to be intact, no obvious focal weakness,gait within normal limits no abnormal reflexes or  asymmetrical SKIN: No acute rashes normal turgor, color, no bruising or petechiae. PSYCH: Oriented, good eye contact, no obvious depression anxiety, cognition and judgment appear normal. LN: no cervical axillary inguinal adenopathy No noted deficits in memory, attention, and speech.  BP Readings from Last 4 Encounters:  04/21/14 138/80  02/23/14 134/72  08/18/13 140/78  04/15/13 140/80   Wt Readings from Last 3 Encounters:  04/21/14 147 lb 14.4 oz (67.087 kg)  02/23/14 138 lb 14.4 oz (63.005 kg)  08/18/13 152 lb (68.947 kg)    ASSESSMENT AND PLAN:  Discussed the following assessment and plan:  Medicare annual wellness visit, subsequent - utd   Essential hypertension - Plan: Basic metabolic panel, CBC with Differential/Platelet, Hemoglobin A1c, Hepatic function panel, Lipid panel, TSH, POCT urinalysis dipstick  Hyperlipidemia - labs today - Plan: Basic metabolic panel, CBC with Differential/Platelet, Hemoglobin A1c, Hepatic function panel, Lipid panel, TSH  Fasting hyperglycemia - had improved last visit lsi weight loss sucessful - Plan: Basic metabolic panel, CBC with Differential/Platelet, Hemoglobin A1c, Hepatic function panel, Lipid panel, TSH, POCT urinalysis dipstick  Patient Care Team: Burnis Medin, MD as PCP - General Erline Levine, MD as Attending Physician (Neurosurgery) Paula Compton, MD as Attending Physician  (Obstetrics and Gynecology) Gatha Mayer, MD as Attending Physician (Gastroenterology) Jarome Matin, MD as Consulting Physician (Dermatology)  Patient Instructions  Continue lifestyle intervention healthy eating and exercise . Flu vaccine vaccine yearly . Will notify you  of labs when available. See urology routine as  Discussed .  If all ok yearly wellness visit  And labs     Standley Brooking. Aizley Stenseth M.D.

## 2014-04-22 ENCOUNTER — Other Ambulatory Visit: Payer: Self-pay | Admitting: Internal Medicine

## 2014-04-22 NOTE — Telephone Encounter (Signed)
Sen to the pharmacy by e-scribe. 

## 2014-05-16 ENCOUNTER — Other Ambulatory Visit: Payer: Self-pay | Admitting: Internal Medicine

## 2014-05-18 NOTE — Telephone Encounter (Signed)
Sent to the pharmacy by e-scribe. 

## 2014-06-01 DIAGNOSIS — L821 Other seborrheic keratosis: Secondary | ICD-10-CM | POA: Diagnosis not present

## 2014-06-01 DIAGNOSIS — L718 Other rosacea: Secondary | ICD-10-CM | POA: Diagnosis not present

## 2014-06-01 DIAGNOSIS — Z85828 Personal history of other malignant neoplasm of skin: Secondary | ICD-10-CM | POA: Diagnosis not present

## 2014-06-01 DIAGNOSIS — D225 Melanocytic nevi of trunk: Secondary | ICD-10-CM | POA: Diagnosis not present

## 2014-06-01 DIAGNOSIS — L812 Freckles: Secondary | ICD-10-CM | POA: Diagnosis not present

## 2014-06-04 DIAGNOSIS — Z6826 Body mass index (BMI) 26.0-26.9, adult: Secondary | ICD-10-CM | POA: Diagnosis not present

## 2014-06-04 DIAGNOSIS — Z01419 Encounter for gynecological examination (general) (routine) without abnormal findings: Secondary | ICD-10-CM | POA: Diagnosis not present

## 2014-06-04 DIAGNOSIS — L900 Lichen sclerosus et atrophicus: Secondary | ICD-10-CM | POA: Diagnosis not present

## 2014-06-29 ENCOUNTER — Encounter: Payer: Self-pay | Admitting: Internal Medicine

## 2014-06-30 DIAGNOSIS — H2513 Age-related nuclear cataract, bilateral: Secondary | ICD-10-CM | POA: Diagnosis not present

## 2014-06-30 DIAGNOSIS — H5203 Hypermetropia, bilateral: Secondary | ICD-10-CM | POA: Diagnosis not present

## 2014-06-30 DIAGNOSIS — H524 Presbyopia: Secondary | ICD-10-CM | POA: Diagnosis not present

## 2014-07-20 ENCOUNTER — Other Ambulatory Visit: Payer: Self-pay

## 2014-08-11 ENCOUNTER — Encounter: Payer: Self-pay | Admitting: Internal Medicine

## 2014-08-19 ENCOUNTER — Other Ambulatory Visit: Payer: Self-pay

## 2014-08-19 DIAGNOSIS — Z1231 Encounter for screening mammogram for malignant neoplasm of breast: Secondary | ICD-10-CM

## 2014-09-15 DIAGNOSIS — H2511 Age-related nuclear cataract, right eye: Secondary | ICD-10-CM | POA: Diagnosis not present

## 2014-09-15 DIAGNOSIS — H2512 Age-related nuclear cataract, left eye: Secondary | ICD-10-CM | POA: Diagnosis not present

## 2014-09-15 DIAGNOSIS — H18411 Arcus senilis, right eye: Secondary | ICD-10-CM | POA: Diagnosis not present

## 2014-09-15 DIAGNOSIS — H524 Presbyopia: Secondary | ICD-10-CM | POA: Diagnosis not present

## 2014-09-15 DIAGNOSIS — H2513 Age-related nuclear cataract, bilateral: Secondary | ICD-10-CM | POA: Diagnosis not present

## 2014-09-25 ENCOUNTER — Ambulatory Visit
Admission: RE | Admit: 2014-09-25 | Discharge: 2014-09-25 | Disposition: A | Discharge status: Home / Self Care | Payer: Medicare Other | Source: Ambulatory Visit

## 2014-09-25 DIAGNOSIS — Z1231 Encounter for screening mammogram for malignant neoplasm of breast: Secondary | ICD-10-CM

## 2014-09-29 ENCOUNTER — Other Ambulatory Visit: Payer: Self-pay | Admitting: Obstetrics and Gynecology

## 2014-09-29 DIAGNOSIS — R928 Other abnormal and inconclusive findings on diagnostic imaging of breast: Secondary | ICD-10-CM

## 2014-10-07 ENCOUNTER — Ambulatory Visit
Admission: RE | Admit: 2014-10-07 | Discharge: 2014-10-07 | Disposition: A | Discharge status: Home / Self Care | Payer: Medicare Other | Source: Ambulatory Visit | Attending: Obstetrics and Gynecology | Admitting: Obstetrics and Gynecology

## 2014-10-07 DIAGNOSIS — R928 Other abnormal and inconclusive findings on diagnostic imaging of breast: Secondary | ICD-10-CM

## 2014-10-07 DIAGNOSIS — N63 Unspecified lump in breast: Secondary | ICD-10-CM | POA: Diagnosis not present

## 2014-11-09 DIAGNOSIS — H25812 Combined forms of age-related cataract, left eye: Secondary | ICD-10-CM | POA: Diagnosis not present

## 2014-11-09 DIAGNOSIS — H2512 Age-related nuclear cataract, left eye: Secondary | ICD-10-CM | POA: Diagnosis not present

## 2014-11-10 DIAGNOSIS — H25041 Posterior subcapsular polar age-related cataract, right eye: Secondary | ICD-10-CM | POA: Diagnosis not present

## 2014-11-10 DIAGNOSIS — H2511 Age-related nuclear cataract, right eye: Secondary | ICD-10-CM | POA: Diagnosis not present

## 2014-11-10 DIAGNOSIS — H25011 Cortical age-related cataract, right eye: Secondary | ICD-10-CM | POA: Diagnosis not present

## 2014-11-23 DIAGNOSIS — H2511 Age-related nuclear cataract, right eye: Secondary | ICD-10-CM | POA: Diagnosis not present

## 2014-11-23 DIAGNOSIS — H25811 Combined forms of age-related cataract, right eye: Secondary | ICD-10-CM | POA: Diagnosis not present

## 2014-12-22 DIAGNOSIS — N3000 Acute cystitis without hematuria: Secondary | ICD-10-CM | POA: Diagnosis not present

## 2014-12-22 DIAGNOSIS — J309 Allergic rhinitis, unspecified: Secondary | ICD-10-CM | POA: Diagnosis not present

## 2014-12-28 ENCOUNTER — Encounter: Payer: Self-pay | Admitting: Internal Medicine

## 2014-12-28 ENCOUNTER — Other Ambulatory Visit (INDEPENDENT_AMBULATORY_CARE_PROVIDER_SITE_OTHER): Payer: Medicare Other

## 2014-12-28 ENCOUNTER — Ambulatory Visit (INDEPENDENT_AMBULATORY_CARE_PROVIDER_SITE_OTHER)
Admission: RE | Admit: 2014-12-28 | Discharge: 2014-12-28 | Disposition: A | Discharge status: Home / Self Care | Payer: Medicare Other | Source: Ambulatory Visit | Attending: Internal Medicine | Admitting: Internal Medicine

## 2014-12-28 ENCOUNTER — Ambulatory Visit (INDEPENDENT_AMBULATORY_CARE_PROVIDER_SITE_OTHER): Payer: Medicare Other | Admitting: Internal Medicine

## 2014-12-28 VITALS — BP 160/86 | HR 73 | Temp 98.7°F | Ht 63.0 in | Wt 149.0 lb

## 2014-12-28 DIAGNOSIS — N390 Urinary tract infection, site not specified: Secondary | ICD-10-CM

## 2014-12-28 DIAGNOSIS — R053 Chronic cough: Secondary | ICD-10-CM

## 2014-12-28 DIAGNOSIS — R05 Cough: Secondary | ICD-10-CM | POA: Diagnosis not present

## 2014-12-28 DIAGNOSIS — R030 Elevated blood-pressure reading, without diagnosis of hypertension: Secondary | ICD-10-CM

## 2014-12-28 DIAGNOSIS — IMO0001 Reserved for inherently not codable concepts without codable children: Secondary | ICD-10-CM

## 2014-12-28 LAB — URINALYSIS, ROUTINE W REFLEX MICROSCOPIC
BILIRUBIN URINE: NEGATIVE
Ketones, ur: NEGATIVE
Leukocytes, UA: NEGATIVE
NITRITE: NEGATIVE
PH: 6.5 (ref 5.0–8.0)
Specific Gravity, Urine: 1.01 (ref 1.000–1.030)
TOTAL PROTEIN, URINE-UPE24: NEGATIVE
Urine Glucose: NEGATIVE
Urobilinogen, UA: 0.2 (ref 0.0–1.0)

## 2014-12-28 MED ORDER — PREDNISONE 20 MG PO TABS: ORAL_TABLET | ORAL | Status: DC

## 2014-12-28 NOTE — Patient Instructions (Addendum)
Stop cough drops use sugar free candy   Get chest x ray   And if ok can add  Prednisone   But stay on the  flonase .  Will let you know about the repeat urinalysis also .

## 2014-12-28 NOTE — Progress Notes (Signed)
Pre visit review using our clinic review tool, if applicable. No additional management support is needed unless otherwise documented below in the visit note.  Chief Complaint  Patient presents with  . Cough    X3wks, fu uti  . Wheezing  . Nasal Congestion    HPI: Patient Madison Erickson  comes in today for SDA for  new problems evaluation.  Has had 3 weeks of cough post  nasal runny nose  Like a cold but no fever  Ongoing  And family said get checked . No fever sob  Some wheeze off and on  ( doesn't  take  Albuterol cause of se )    Using cough drops a lot  No fever   [place]   Dx with "raging uti"  No flank pain    5 days cipro 500 bid   Last dose of cipro   Yesterday .  Not sure if totally gone some minor sx  ROS: See pertinent positives and negatives per HPI. No gross hematuria  No cp flank pain chills   Past Medical History  Diagnosis Date  . Hematuria     urethral polyps  . HT (hammer toe)   . Osteoarthritis   . Lichen sclerosus     gyne care topical steroids  . GERD (gastroesophageal reflux disease)   . Blood transfusion abn reaction or complication, no procedure mishap   . Urinary incontinence   . Eczema   . Abnormal LFTs     fatty liver on [place] MRI faay liver and hemangioma 2008 biopsy 2011  . Bladder polyps   . Benign tumor   . Colon polyp 2009    Tubulovillous adenoma   . Diverticulosis of colon   . Status post partial lobectomy of lung     for bronchiesctesis  . Asthma     allergy  . Kidney disease     nephritis as  child. polpys, hematuria  . UTI (lower urinary tract infection)   . Cancer (HCC)     squameous cell lsft leg hx  . Hypertension   . Migraines     hx migraines  . Shortness of breath     partial lower rt lung lobectomy.  . Blood transfusion 1969  . Fatty liver disease, nonalcoholic     confirmed biopsy 2011 mild no fibrosis     Family History  Problem Relation Age of Onset  . Cancer Mother     lung  . Heart disease  Mother   . Diabetes Paternal Grandfather   . Cancer Paternal Grandfather     bladder  . Mental retardation Other     Social History   Social History  . Marital Status: Married    Spouse Name: N/A  . Number of Children: N/A  . Years of Education: N/A   Social History Main Topics  . Smoking status: Never Smoker   . Smokeless tobacco: None     Comment: wine q 2-3 weeks  . Alcohol Use: Yes     Comment: 4-5 per year   . Drug Use: No  . Sexual Activity: Not Asked     Comment: hysterectomy   Other Topics Concern  . 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 cetirizine (ZYRTEC) 10 MG tablet Take 10 mg by mouth daily.      . clobetasol (TEMOVATE) 0.05 % cream Apply 1 application topically 2 (two) times daily as needed. For irritation of skin     . CRESTOR 10 MG tablet TAKE 1 TABLET DAILY 90 tablet 2  . hydrochlorothiazide (HYDRODIURIL) 25 MG tablet TAKE 1 TABLET DAILY 90 tablet 2  . metoprolol succinate (TOPROL-XL) 50 MG 24 hr tablet TAKE 1 TABLET DAILY WITH OR IMMEDIATELY FOLLOWING A MEAL 90 tablet 3  . NEXIUM 40 MG capsule TAKE 1 CAPSULE DAILY BEFORE BREAKFAST 90 capsule 2   No facility-administered medications prior to visit.     EXAM:  BP 160/86 mmHg  Pulse 73  Temp(Src) 98.7 F (37.1 C) (Oral)  Ht 5\' 3"  (1.6 m)  Wt 149 lb (67.586 kg)  BMI 26.40 kg/m2  Body mass index is 26.4 kg/(m^2).  GENERAL: vitals reviewed and listed above, alert, oriented, appears well hydrated and in no acute distress HEENT: atraumatic, conjunctiva  clear, no obvious abnormalities on inspection of external nose and ears tmx nl midl runny nose clear congestion no face pain OP : no lesion edema or exudate  NECK: no obvious masses on inspection palpation  LUNGS: clear to auscultation  bilaterally, no wheezes, rales or rhonchi, good air movement? Right base exp wheeze?  Well healed scar.  CV: HRRR, no clubbing cyanosis or  peripheral edema nl cap refill  No flank pain  MS: moves all extremities without noticeable focal  abnormality PSYCH: pleasant and cooperative, no obvious depression or anxiety Wt Readings from Last 3 Encounters:  12/28/14 149 lb (67.586 kg)  04/21/14 147 lb 14.4 oz (67.087 kg)  02/23/14 138 lb 14.4 oz (63.005 kg)     ASSESSMENT AND PLAN:  Discussed the following assessment and plan:  Cough, persistent - seems allergic rad componenet   has risk lung surgery  x ray consider pred if needed stip cough drops con flonase - Plan: DG Chest 2 View  Urinary tract infection, site not specified - hx of just finished med   check ua at [facility] - Plan: Urinalysis  Elevated BP - has been  good recheck when out of office  -Patient advised to return or notify health care team  if symptoms worsen ,persist or new concerns arise.  Patient Instructions  Stop cough drops use sugar free candy   Get chest x ray   And if ok can add  Prednisone   But stay on the  flonase .  Will let you know about the repeat urinalysis also .   Standley Brooking. Panosh M.D.

## 2014-12-31 ENCOUNTER — Encounter: Payer: Self-pay | Admitting: Internal Medicine

## 2015-01-15 ENCOUNTER — Other Ambulatory Visit: Payer: Self-pay | Admitting: Internal Medicine

## 2015-04-06 DIAGNOSIS — L900 Lichen sclerosus et atrophicus: Secondary | ICD-10-CM | POA: Diagnosis not present

## 2015-04-06 DIAGNOSIS — Z01419 Encounter for gynecological examination (general) (routine) without abnormal findings: Secondary | ICD-10-CM | POA: Diagnosis not present

## 2015-04-06 DIAGNOSIS — Z6826 Body mass index (BMI) 26.0-26.9, adult: Secondary | ICD-10-CM | POA: Diagnosis not present

## 2015-04-06 DIAGNOSIS — Z1389 Encounter for screening for other disorder: Secondary | ICD-10-CM | POA: Diagnosis not present

## 2015-04-06 DIAGNOSIS — Z124 Encounter for screening for malignant neoplasm of cervix: Secondary | ICD-10-CM | POA: Diagnosis not present

## 2015-04-07 ENCOUNTER — Encounter: Payer: Self-pay | Admitting: Family Medicine

## 2015-05-11 ENCOUNTER — Other Ambulatory Visit: Payer: Self-pay | Admitting: Internal Medicine

## 2015-05-12 ENCOUNTER — Telehealth: Payer: Self-pay | Admitting: Family Medicine

## 2015-05-12 NOTE — Telephone Encounter (Signed)
Sent in 90 day supply.  Message sent to scheduling to help make wellness exam.

## 2015-05-12 NOTE — Telephone Encounter (Signed)
Pt now due for medicare wellness.  Please contact her to set up fasting appt.  Thanks!

## 2015-05-12 NOTE — Telephone Encounter (Signed)
lmom for pt to call back

## 2015-05-13 NOTE — Telephone Encounter (Signed)
First available fasting am appointment is not until 7/19 at 10:45 am.  Pt would like to know if she can come in sooner, because that is a long time to wait for her cpe. Is it ok to work in another day sooner?

## 2015-05-13 NOTE — Telephone Encounter (Signed)
medcare wellness  Does not need labs   If she is straight medicare we can order labs ahead under a dx code below  and then have her come in  fro fu meds and wellness  Dx hyperli[pidemai HT  Fatty liver  And  Fasting Hyperglycemia and   Medication management should couver  Lipid bmp lfts cbcdiff tsh  And HG a1c

## 2015-05-14 NOTE — Telephone Encounter (Signed)
ok 

## 2015-05-14 NOTE — Telephone Encounter (Signed)
Pt is asking to be worked in sooner.

## 2015-05-14 NOTE — Telephone Encounter (Addendum)
Pt would like to come in before her appointment for her lab work.  I have scheduled the pt as she requested. Can you put the orders in? See Dr Regis Bill orders below. Thank you.

## 2015-05-17 ENCOUNTER — Other Ambulatory Visit: Payer: Self-pay | Admitting: Family Medicine

## 2015-05-17 DIAGNOSIS — I10 Essential (primary) hypertension: Secondary | ICD-10-CM

## 2015-05-17 DIAGNOSIS — Z79899 Other long term (current) drug therapy: Secondary | ICD-10-CM

## 2015-05-17 DIAGNOSIS — E785 Hyperlipidemia, unspecified: Secondary | ICD-10-CM

## 2015-05-17 DIAGNOSIS — K760 Fatty (change of) liver, not elsewhere classified: Secondary | ICD-10-CM

## 2015-05-17 DIAGNOSIS — R7301 Impaired fasting glucose: Secondary | ICD-10-CM

## 2015-05-17 NOTE — Telephone Encounter (Signed)
Orders has been linked

## 2015-05-17 NOTE — Telephone Encounter (Signed)
Orders placed in the system.

## 2015-07-05 ENCOUNTER — Other Ambulatory Visit (INDEPENDENT_AMBULATORY_CARE_PROVIDER_SITE_OTHER): Payer: Medicare Other

## 2015-07-05 DIAGNOSIS — E785 Hyperlipidemia, unspecified: Secondary | ICD-10-CM

## 2015-07-05 DIAGNOSIS — I10 Essential (primary) hypertension: Secondary | ICD-10-CM | POA: Diagnosis not present

## 2015-07-05 DIAGNOSIS — Z79899 Other long term (current) drug therapy: Secondary | ICD-10-CM

## 2015-07-05 DIAGNOSIS — R7301 Impaired fasting glucose: Secondary | ICD-10-CM | POA: Diagnosis not present

## 2015-07-05 DIAGNOSIS — K760 Fatty (change of) liver, not elsewhere classified: Secondary | ICD-10-CM

## 2015-07-05 LAB — BASIC METABOLIC PANEL
BUN: 14 mg/dL (ref 6–23)
CHLORIDE: 103 meq/L (ref 96–112)
CO2: 29 mEq/L (ref 19–32)
Calcium: 9.8 mg/dL (ref 8.4–10.5)
Creatinine, Ser: 0.64 mg/dL (ref 0.40–1.20)
GFR: 96.23 mL/min (ref >60.00)
GLUCOSE: 112 mg/dL — AB (ref 70–99)
POTASSIUM: 3.5 meq/L (ref 3.5–5.1)
SODIUM: 139 meq/L (ref 135–145)

## 2015-07-05 LAB — HEPATIC FUNCTION PANEL
ALBUMIN: 4.4 g/dL (ref 3.5–5.2)
ALK PHOS: 38 U/L — AB (ref 39–117)
ALT: 19 U/L (ref 0–35)
AST: 19 U/L (ref 0–37)
BILIRUBIN TOTAL: 0.4 mg/dL (ref 0.2–1.2)
Bilirubin, Direct: 0.1 mg/dL (ref 0.0–0.3)
Total Protein: 6.8 g/dL (ref 6.0–8.3)

## 2015-07-05 LAB — CBC WITH DIFFERENTIAL/PLATELET
BASOS PCT: 1 % (ref 0.0–3.0)
Basophils Absolute: 0.1 10*3/uL (ref 0.0–0.1)
EOS ABS: 0.2 10*3/uL (ref 0.0–0.7)
EOS PCT: 2.8 % (ref 0.0–5.0)
HCT: 42.3 % (ref 36.0–46.0)
Hemoglobin: 14.2 g/dL (ref 12.0–15.0)
LYMPHS ABS: 2.8 10*3/uL (ref 0.7–4.0)
Lymphocytes Relative: 34.2 % (ref 12.0–46.0)
MCHC: 33.5 g/dL (ref 30.0–36.0)
MCV: 87.2 fl (ref 78.0–100.0)
MONO ABS: 0.6 10*3/uL (ref 0.1–1.0)
Monocytes Relative: 7 % (ref 3.0–12.0)
NEUTROS PCT: 55 % (ref 43.0–77.0)
Neutro Abs: 4.5 10*3/uL (ref 1.4–7.7)
PLATELETS: 258 10*3/uL (ref 150.0–400.0)
RBC: 4.86 Mil/uL (ref 3.87–5.11)
RDW: 12.3 % (ref 11.5–15.5)
WBC: 8.1 10*3/uL (ref 4.0–10.5)

## 2015-07-05 LAB — LIPID PANEL
CHOLESTEROL: 125 mg/dL (ref 0–200)
HDL: 31.2 mg/dL — ABNORMAL LOW (ref >39.00)
LDL CALC: 69 mg/dL (ref 0–99)
NonHDL: 93.58
TRIGLYCERIDES: 122 mg/dL (ref 0.0–149.0)
Total CHOL/HDL Ratio: 4
VLDL: 24.4 mg/dL (ref 0.0–40.0)

## 2015-07-05 LAB — HEMOGLOBIN A1C: HEMOGLOBIN A1C: 5.8 % (ref 4.6–6.5)

## 2015-07-05 LAB — TSH: TSH: 1.92 u[IU]/mL (ref 0.35–4.50)

## 2015-07-06 DIAGNOSIS — L718 Other rosacea: Secondary | ICD-10-CM | POA: Diagnosis not present

## 2015-07-06 DIAGNOSIS — L812 Freckles: Secondary | ICD-10-CM | POA: Diagnosis not present

## 2015-07-06 DIAGNOSIS — L821 Other seborrheic keratosis: Secondary | ICD-10-CM | POA: Diagnosis not present

## 2015-07-06 DIAGNOSIS — D171 Benign lipomatous neoplasm of skin and subcutaneous tissue of trunk: Secondary | ICD-10-CM | POA: Diagnosis not present

## 2015-07-06 DIAGNOSIS — D1801 Hemangioma of skin and subcutaneous tissue: Secondary | ICD-10-CM | POA: Diagnosis not present

## 2015-07-06 DIAGNOSIS — Z85828 Personal history of other malignant neoplasm of skin: Secondary | ICD-10-CM | POA: Diagnosis not present

## 2015-07-06 DIAGNOSIS — D225 Melanocytic nevi of trunk: Secondary | ICD-10-CM | POA: Diagnosis not present

## 2015-07-06 NOTE — Progress Notes (Signed)
Pre visit review using our clinic review tool, if applicable. No additional management support is needed unless otherwise documented below in the visit note.  Chief Complaint  Patient presents with  . Medicare Wellness    HPI: Madison Erickson 75 y.o. comes in today for Preventive Medicare wellness visit .and Chronic disease management BP taking med no se  Seems ok  No cp sob cv sx Pulm ;the same  ( hx of lung surgery) Bg ;weight still  Trying to do healthy eating   Just came back from beach and week at [place] .  Activity ; Good   [place]  [place]ps not marked . No injury. Not back to urology for a while monitory bladder polyps abn cytology in past. Hx cataract surgery doing well  Sees derm and gyne lichen sclerosis etc .   Health Maintenance  Topic Date Due  . INFLUENZA VACCINE  08/24/2015  . MAMMOGRAM  09/24/2016  . COLONOSCOPY  07/09/2019  . TETANUS/TDAP  02/12/2022  . DEXA SCAN  Completed  . ZOSTAVAX  Completed  . PNA vac Low Risk Adult  Completed   Health Maintenance Review LIFESTYLE:  Tn AD ocass etoh Sugar beverages:n Sleep:7 hours     MEDICARE DOCUMENT QUESTIONS  TO SCAN   Hearing: g  Vision:  No limitations at present . Last eye check UTDreaders  Safety:  Has smoke detector and wears seat belts.  No firearms. No excess sun exposure. Sees dentist regularly.  Falls: tripped at pool side unmarked step  Advance directive :  Reviewed  Has one.  Memory: Felt to be good  , no concern from her or her family.  Depression: No anhedonia unusual crying or depressive symptoms  Nutrition: Eats well balanced diet; adequate calcium and vitamin D. No swallowing chewing problems.  Injury: no major injuries in the last six months.  Other healthcare providers:  Reviewed today .  Social:  Lives with spouse married. Travels   Preventive parameters: up-to-date  Reviewed   ADLS:   There are no problems or need for assistance  driving, feeding,  obtaining food, dressing, toileting and bathing, managing money using phone. She is independent.    ROS:  GEN/ HEENT: No fever, significant weight changes sweats headaches vision problems hearing changes, CV/ PULM; No chest pain shortness of breath cough, syncope,edema  change in exercise tolerance. GI /GU: No adominal pain, vomiting, change in bowel habits. No blood in the stool. No significant GU symptoms. SKIN/HEME: ,no acute skin rashes suspicious lesions or bleeding. No lymphadenopathy, nodules, masses.  NEURO/ PSYCH:  No neurologic signs such as weakness numbness. No depression anxiety. IMM/ Allergy: No unusual infections.  Allergy .   REST of 12 system review negative except as per HPI   Past Medical History  Diagnosis Date  . Hematuria     urethral polyps  . HT (hammer toe)   . Osteoarthritis   . Lichen sclerosus     gyne care topical steroids  . GERD (gastroesophageal reflux disease)   . Blood transfusion abn reaction or complication, no procedure mishap   . Urinary incontinence   . Eczema   . Abnormal LFTs     fatty liver on [place] MRI faay liver and hemangioma 2008 biopsy 2011  . Bladder polyps   . Benign tumor   . Colon polyp 2009    Tubulovillous adenoma   . Diverticulosis of colon   . Status post partial lobectomy of lung  for bronchiesctesis  . Asthma     allergy  . Kidney disease     nephritis as  child. polpys, hematuria  . UTI (lower urinary tract infection)   . Cancer (HCC)     squameous cell lsft leg hx  . Hypertension   . Migraines     hx migraines  . Shortness of breath     partial lower rt lung lobectomy.  . Blood transfusion 1969  . Fatty liver disease, nonalcoholic     confirmed biopsy 2011 mild no fibrosis     Family History  Problem Relation Age of Onset  . Cancer Mother     lung  . Heart disease Mother   . Diabetes Paternal Grandfather   . Cancer Paternal Grandfather     bladder  . Mental retardation Other     Social History    Social History  . Marital Status: Married    Spouse Name: N/A  . Number of Children: N/A  . Years of Education: N/A   Social History Main Topics  . Smoking status: Never Smoker   . Smokeless tobacco: Never Used     Comment: wine q 2-3 weeks  . Alcohol Use: 0.0 oz/week    0 Standard drinks or equivalent per week     Comment: 4-5 per year   . Drug Use: No  . Sexual Activity: Not on file     Comment: hysterectomy   Other Topics Concern  . 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 cetirizine (ZYRTEC) 10 MG tablet Take 10 mg by mouth daily.    . clobetasol (TEMOVATE) 0.05 % cream Apply 1 application topically 2 (two) times daily as needed. For irritation of skin   . CRESTOR 10 MG tablet TAKE 1 TABLET DAILY  . hydrochlorothiazide (HYDRODIURIL) 25 MG tablet TAKE 1 TABLET DAILY  . metoprolol succinate (TOPROL-XL) 50 MG 24 hr tablet TAKE 1 TABLET DAILY WITH OR IMMEDIATELY FOLLOWING A MEAL  . NEXIUM 40 MG capsule TAKE 1 CAPSULE DAILY BEFORE BREAKFAST  . [DISCONTINUED] predniSONE (DELTASONE) 20 MG tablet Take 3 po qd for 2 days then 2 po qd for 3 days,or as directed   No facility-administered encounter medications on file as of 07/07/2015.    EXAM:  BP 138/70 mmHg  Temp(Src) 98.9 F (37.2 C) (Oral)  Ht 5' 2.5" (1.588 m)  Wt 148 lb 11.2 oz (67.45 kg)  BMI 26.75 kg/m2  Body mass index is 26.75 kg/(m^2).  Physical Exam: Vital signs reviewed OPF:YTWK is a well-developed well-nourished alert cooperative   who appears stated age in no acute distress.  HEENT: normocephalic atraumatic , Eyes: PERRL EOM's full, conjunctiva clear, Nares: paten,t no deformity discharge or tenderness., Ears: no deformity EAC's clear TMs with normal landmarks. Mouth: clear OP, no lesions, edema.   Moist mucous membranes. Dentition in adequate repair. NECK: supple without masses, thyromegaly or bruits. CHEST/PULM:  Clear to auscultation and percussion breath sounds equal no wheeze , rales or rhonchi. No chest wall deformities or tenderness. CV: PMI is nondisplaced, S1 S2 no gallops, murmurs, rubs. Peripheral pulses are without delay.No JVD . Breast: normal by inspection . No dimpling, discharge, masses, tenderness or discharge . ABDOMEN: Bowel sounds normal nontender  No  guard or rebound, no hepato splenomegal no CVA tenderness.   Extremtities:  No clubbing cyanosis or edema, no acute joint swelling or redness no focal atrophy NEURO:  Oriented x3, cranial nerves 3-12 appear to be intact, no obvious focal weakness,gait within normal limits no abnormal reflexes or asymmetrical SKIN: No acute rashes normal turgor, color, no bruising or petechiae. Sun changes  PSYCH: Oriented, good eye contact, no obvious depression anxiety, cognition and judgment appear normal. LN: no cervical axillary inguinal adenopathy No noted deficits in memory, attention, and speech.   Lab Results  Component Value Date   WBC 8.1 07/05/2015   HGB 14.2 07/05/2015   HCT 42.3 07/05/2015   PLT 258.0 07/05/2015   GLUCOSE 112* 07/05/2015   CHOL 125 07/05/2015   TRIG 122.0 07/05/2015   HDL 31.20* 07/05/2015   LDLDIRECT 201.9 10/26/2008   LDLCALC 69 07/05/2015   ALT 19 07/05/2015   AST 19 07/05/2015   NA 139 07/05/2015   K 3.5 07/05/2015   CL 103 07/05/2015   CREATININE 0.64 07/05/2015   BUN 14 07/05/2015   CO2 29 07/05/2015   TSH 1.92 07/05/2015   INR 0.96 01/29/2009   HGBA1C 5.8 07/05/2015   BP Readings from Last 3 Encounters:  07/07/15 138/70  12/28/14 160/86  04/21/14 138/80   Wt Readings from Last 3 Encounters:  07/07/15 148 lb 11.2 oz (67.45 kg)  12/28/14 149 lb (67.586 kg)  04/21/14 147 lb 14.4 oz (67.087 kg)    ASSESSMENT AND PLAN:  Discussed the following assessment and plan:  Medicare  annual wellness visit, subsequent  Essential hypertension - controlled  Hyperlipidemia  Fasting hyperglycemia - good a1c stable  Medication management  Need for 23-polyvalent pneumococcal polysaccharide vaccine - Plan: Pneumococcal polysaccharide vaccine 23-valent greater than or equal to 2yo subcutaneous/IM  Patient Care Team: Burnis Medin, MD as PCP - General Erline Levine, MD as Attending Physician (Neurosurgery) Paula Compton, MD as Attending Physician (Obstetrics and Gynecology) Gatha Mayer, MD as Attending Physician (Gastroenterology) Jarome Matin, MD as Consulting Physician (Dermatology) Jenel Lucks. Clydene Laming, MD (Ophthalmology)  Patient Instructions  Continue lifestyle intervention healthy eating and exercise . This is the best thing you can do for fatty liver and controlling blood sugar   Brain health .   This will help sugars and cholesterol and blood pressure .   Discuss cholesterol medication at next years visit. Make sure  You get your yearly  Urology check with provider of your choice.    Health Maintenance, Female Adopting a healthy lifestyle and getting preventive care can go a long way to promote health and wellness. Talk with your health care provider about what schedule of regular examinations is right for you. This is a good chance for you to check in with your provider about disease prevention and staying healthy. In between checkups, there are plenty of things you can do on your own. Experts have done a lot of research about which lifestyle changes and preventive measures are most likely to keep you healthy. Ask your health care provider for more information. WEIGHT AND DIET  Eat a healthy diet  Be sure to include plenty of vegetables, fruits, low-fat dairy products, and lean protein.  Do not eat a lot of foods high in solid fats, added sugars, or salt.  Get regular exercise. This is one of the most important things you can do for your health.  Most adults  should exercise for at least 150 minutes each week. The exercise should  increase your heart rate and make you sweat (moderate-intensity exercise).  Most adults should also do strengthening exercises at least twice a week. This is in addition to the moderate-intensity exercise.  Maintain a healthy weight  Body mass index (BMI) is a measurement that can be used to identify possible weight problems. It estimates body fat based on height and weight. Your health care provider can help determine your BMI and help you achieve or maintain a healthy weight.  For females 28 years of age and older:   A BMI below 18.5 is considered underweight.  A BMI of 18.5 to 24.9 is normal.  A BMI of 25 to 29.9 is considered overweight.  A BMI of 30 and above is considered obese.  Watch levels of cholesterol and blood lipids  You should start having your blood tested for lipids and cholesterol at 75 years of age, then have this test every 5 years.  You may need to have your cholesterol levels checked more often if:  Your lipid or cholesterol levels are high.  You are older than 75 years of age.  You are at high risk for heart disease.  CANCER SCREENING   Lung Cancer  Lung cancer screening is recommended for adults 11-26 years old who are at high risk for lung cancer because of a history of smoking.  A yearly low-dose CT scan of the lungs is recommended for people who:  Currently smoke.  Have quit within the past 15 years.  Have at least a 30-pack-year history of smoking. A pack year is smoking an average of one pack of cigarettes a day for 1 year.  Yearly screening should continue until it has been 15 years since you quit.  Yearly screening should stop if you develop a health problem that would prevent you from having lung cancer treatment.  Breast Cancer  Practice breast self-awareness. This means understanding how your breasts normally appear and feel.  It also means doing regular  breast self-exams. Let your health care provider know about any changes, no matter how small.  If you are in your 20s or 30s, you should have a clinical breast exam (CBE) by a health care provider every 1-3 years as part of a regular health exam.  If you are 25 or older, have a CBE every year. Also consider having a breast X-ray (mammogram) every year.  If you have a family history of breast cancer, talk to your health care provider about genetic screening.  If you are at high risk for breast cancer, talk to your health care provider about having an MRI and a mammogram every year.  Breast cancer gene (BRCA) assessment is recommended for women who have family members with BRCA-related cancers. BRCA-related cancers include:  Breast.  Ovarian.  Tubal.  Peritoneal cancers.  Results of the assessment will determine the need for genetic counseling and BRCA1 and BRCA2 testing. Cervical Cancer Your health care provider may recommend that you be screened regularly for cancer of the pelvic organs (ovaries, uterus, and vagina). This screening involves a pelvic examination, including checking for microscopic changes to the surface of your cervix (Pap test). You may be encouraged to have this screening done every 3 years, beginning at age 41.  For women ages 22-65, health care providers may recommend pelvic exams and Pap testing every 3 years, or they may recommend the Pap and pelvic exam, combined with testing for human papilloma virus (HPV), every 5 years. Some types of HPV increase your risk  of cervical cancer. Testing for HPV may also be done on women of any age with unclear Pap test results.  Other health care providers may not recommend any screening for nonpregnant women who are considered low risk for pelvic cancer and who do not have symptoms. Ask your health care provider if a screening pelvic exam is right for you.  If you have had past treatment for cervical cancer or a condition that  could lead to cancer, you need Pap tests and screening for cancer for at least 20 years after your treatment. If Pap tests have been discontinued, your risk factors (such as having a new sexual partner) need to be reassessed to determine if screening should resume. Some women have medical problems that increase the chance of getting cervical cancer. In these cases, your health care provider may recommend more frequent screening and Pap tests. Colorectal Cancer  This type of cancer can be detected and often prevented.  Routine colorectal cancer screening usually begins at 75 years of age and continues through 75 years of age.  Your health care provider may recommend screening at an earlier age if you have risk factors for colon cancer.  Your health care provider may also recommend using home test kits to check for hidden blood in the stool.  A small camera at the end of a tube can be used to examine your colon directly (sigmoidoscopy or colonoscopy). This is done to check for the earliest forms of colorectal cancer.  Routine screening usually begins at age 73.  Direct examination of the colon should be repeated every 5-10 years through 76 years of age. However, you may need to be screened more often if early forms of precancerous polyps or small growths are found. Skin Cancer  Check your skin from head to toe regularly.  Tell your health care provider about any new moles or changes in moles, especially if there is a change in a mole's shape or color.  Also tell your health care provider if you have a mole that is larger than the size of a pencil eraser.  Always use sunscreen. Apply sunscreen liberally and repeatedly throughout the day.  Protect yourself by wearing long sleeves, pants, a wide-brimmed hat, and sunglasses whenever you are outside. HEART DISEASE, DIABETES, AND HIGH BLOOD PRESSURE   High blood pressure causes heart disease and increases the risk of stroke. High blood pressure  is more likely to develop in:  People who have blood pressure in the high end of the normal range (130-139/85-89 mm Hg).  People who are overweight or obese.  People who are African American.  If you are 41-28 years of age, have your blood pressure checked every 3-5 years. If you are 7 years of age or older, have your blood pressure checked every year. You should have your blood pressure measured twice--once when you are at a hospital or clinic, and once when you are not at a hospital or clinic. Record the average of the two measurements. To check your blood pressure when you are not at a hospital or clinic, you can use:  An automated blood pressure machine at a pharmacy.  A home blood pressure monitor.  If you are between 35 years and 50 years old, ask your health care provider if you should take aspirin to prevent strokes.  Have regular diabetes screenings. This involves taking a blood sample to check your fasting blood sugar level.  If you are at a normal weight and have a  low risk for diabetes, have this test once every three years after 75 years of age.  If you are overweight and have a high risk for diabetes, consider being tested at a younger age or more often. PREVENTING INFECTION  Hepatitis B  If you have a higher risk for hepatitis B, you should be screened for this virus. You are considered at high risk for hepatitis B if:  You were born in a country where hepatitis B is common. Ask your health care provider which countries are considered high risk.  Your parents were born in a high-risk country, and you have not been immunized against hepatitis B (hepatitis B vaccine).  You have HIV or AIDS.  You use needles to inject street drugs.  You live with someone who has hepatitis B.  You have had sex with someone who has hepatitis B.  You get hemodialysis treatment.  You take certain medicines for conditions, including cancer, organ transplantation, and autoimmune  conditions. Hepatitis C  Blood testing is recommended for:  Everyone born from [birth year] through 1965.  Anyone with known risk factors for hepatitis C. Sexually transmitted infections (STIs)  You should be screened for sexually transmitted infections (STIs) including gonorrhea and chlamydia if:  You are sexually active and are younger than 75 years of age.  You are older than 75 years of age and your health care provider tells you that you are at risk for this type of infection.  Your sexual activity has changed since you were last screened and you are at an increased risk for chlamydia or gonorrhea. Ask your health care provider if you are at risk.  If you do not have HIV, but are at risk, it may be recommended that you take a prescription medicine daily to prevent HIV infection. This is called pre-exposure prophylaxis (PrEP). You are considered at risk if:  You are sexually active and do not regularly use condoms or know the HIV status of your partner(s).  You take drugs by injection.  You are sexually active with a partner who has HIV. Talk with your health care provider about whether you are at high risk of being infected with HIV. If you choose to begin PrEP, you should first be tested for HIV. You should then be tested every 3 months for as long as you are taking PrEP.  PREGNANCY   If you are premenopausal and you may become pregnant, ask your health care provider about preconception counseling.  If you may become pregnant, take 400 to 800 micrograms (mcg) of folic acid every day.  If you want to prevent pregnancy, talk to your health care provider about birth control (contraception). OSTEOPOROSIS AND MENOPAUSE   Osteoporosis is a disease in which the bones lose minerals and strength with aging. This can result in serious bone fractures. Your risk for osteoporosis can be identified using a bone density scan.  If you are 26 years of age or older, or if you are at risk for  osteoporosis and fractures, ask your health care provider if you should be screened.  Ask your health care provider whether you should take a calcium or vitamin D supplement to lower your risk for osteoporosis.  Menopause may have certain physical symptoms and risks.  Hormone replacement therapy may reduce some of these symptoms and risks. Talk to your health care provider about whether hormone replacement therapy is right for you.  HOME CARE INSTRUCTIONS   Schedule regular health, dental, and eye exams.  Stay  current with your immunizations.   Do not use any tobacco products including cigarettes, chewing tobacco, or electronic cigarettes.  If you are pregnant, do not drink alcohol.  If you are breastfeeding, limit how much and how often you drink alcohol.  Limit alcohol intake to no more than 1 drink per day for nonpregnant women. One drink equals 12 ounces of beer, 5 ounces of wine, or 1 ounces of hard liquor.  Do not use street drugs.  Do not share needles.  Ask your health care provider for help if you need support or information about quitting drugs.  Tell your health care provider if you often feel depressed.  Tell your health care provider if you have ever been abused or do not feel safe at home.   This information is not intended to replace advice given to you by your health care provider. Make sure you discuss any questions you have with your health care provider.   Document Released: 07/25/2010 Document Revised: 01/30/2014 Document Reviewed: 12/11/2012 Elsevier Interactive Patient Education 2016 [place]. Tobiah Celestine M.D.

## 2015-07-07 ENCOUNTER — Encounter: Payer: Self-pay | Admitting: Internal Medicine

## 2015-07-07 ENCOUNTER — Ambulatory Visit (INDEPENDENT_AMBULATORY_CARE_PROVIDER_SITE_OTHER): Payer: Medicare Other | Admitting: Internal Medicine

## 2015-07-07 VITALS — BP 138/70 | Temp 98.9°F | Ht 62.5 in | Wt 148.7 lb

## 2015-07-07 DIAGNOSIS — Z79899 Other long term (current) drug therapy: Secondary | ICD-10-CM | POA: Diagnosis not present

## 2015-07-07 DIAGNOSIS — E785 Hyperlipidemia, unspecified: Secondary | ICD-10-CM

## 2015-07-07 DIAGNOSIS — R7301 Impaired fasting glucose: Secondary | ICD-10-CM

## 2015-07-07 DIAGNOSIS — I10 Essential (primary) hypertension: Secondary | ICD-10-CM | POA: Diagnosis not present

## 2015-07-07 DIAGNOSIS — Z23 Encounter for immunization: Secondary | ICD-10-CM

## 2015-07-07 DIAGNOSIS — Z0000 Encounter for general adult medical examination without abnormal findings: Secondary | ICD-10-CM

## 2015-07-07 NOTE — Assessment & Plan Note (Signed)
Discussed use of statin medicine. Would benefit was more risk. She's done well on this and at this time will remain and revisited at age 75. She has hyperglycemia hypertension but has lost a lot of weight and eating healthier for the last few years. Consideration of stopping medication. Has a history of fatty liver noted on CT scan.

## 2015-07-07 NOTE — Patient Instructions (Signed)
Continue lifestyle intervention healthy eating and exercise . This is the best thing you can do for fatty liver and controlling blood sugar   Brain health .   This will help sugars and cholesterol and blood pressure .   Discuss cholesterol medication at next years visit. Make sure  You get your yearly  Urology check with provider of your choice.    Health Maintenance, Female Adopting a healthy lifestyle and getting preventive care can go a long way to promote health and wellness. Talk with your health care provider about what schedule of regular examinations is right for you. This is a good chance for you to check in with your provider about disease prevention and staying healthy. In between checkups, there are plenty of things you can do on your own. Experts have done a lot of research about which lifestyle changes and preventive measures are most likely to keep you healthy. Ask your health care provider for more information. WEIGHT AND DIET  Eat a healthy diet  Be sure to include plenty of vegetables, fruits, low-fat dairy products, and lean protein.  Do not eat a lot of foods high in solid fats, added sugars, or salt.  Get regular exercise. This is one of the most important things you can do for your health.  Most adults should exercise for at least 150 minutes each week. The exercise should increase your heart rate and make you sweat (moderate-intensity exercise).  Most adults should also do strengthening exercises at least twice a week. This is in addition to the moderate-intensity exercise.  Maintain a healthy weight  Body mass index (BMI) is a measurement that can be used to identify possible weight problems. It estimates body fat based on height and weight. Your health care provider can help determine your BMI and help you achieve or maintain a healthy weight.  For females 58 years of age and older:   A BMI below 18.5 is considered underweight.  A BMI of 18.5 to 24.9 is  normal.  A BMI of 25 to 29.9 is considered overweight.  A BMI of 30 and above is considered obese.  Watch levels of cholesterol and blood lipids  You should start having your blood tested for lipids and cholesterol at 75 years of age, then have this test every 5 years.  You may need to have your cholesterol levels checked more often if:  Your lipid or cholesterol levels are high.  You are older than 75 years of age.  You are at high risk for heart disease.  CANCER SCREENING   Lung Cancer  Lung cancer screening is recommended for adults 9-87 years old who are at high risk for lung cancer because of a history of smoking.  A yearly low-dose CT scan of the lungs is recommended for people who:  Currently smoke.  Have quit within the past 15 years.  Have at least a 30-pack-year history of smoking. A pack year is smoking an average of one pack of cigarettes a day for 1 year.  Yearly screening should continue until it has been 15 years since you quit.  Yearly screening should stop if you develop a health problem that would prevent you from having lung cancer treatment.  Breast Cancer  Practice breast self-awareness. This means understanding how your breasts normally appear and feel.  It also means doing regular breast self-exams. Let your health care provider know about any changes, no matter how small.  If you are in your 68s or  44s, you should have a clinical breast exam (CBE) by a health care provider every 1-3 years as part of a regular health exam.  If you are 41 or older, have a CBE every year. Also consider having a breast X-ray (mammogram) every year.  If you have a family history of breast cancer, talk to your health care provider about genetic screening.  If you are at high risk for breast cancer, talk to your health care provider about having an MRI and a mammogram every year.  Breast cancer gene (BRCA) assessment is recommended for women who have family members  with BRCA-related cancers. BRCA-related cancers include:  Breast.  Ovarian.  Tubal.  Peritoneal cancers.  Results of the assessment will determine the need for genetic counseling and BRCA1 and BRCA2 testing. Cervical Cancer Your health care provider may recommend that you be screened regularly for cancer of the pelvic organs (ovaries, uterus, and vagina). This screening involves a pelvic examination, including checking for microscopic changes to the surface of your cervix (Pap test). You may be encouraged to have this screening done every 3 years, beginning at age 56.  For women ages 2-65, health care providers may recommend pelvic exams and Pap testing every 3 years, or they may recommend the Pap and pelvic exam, combined with testing for human papilloma virus (HPV), every 5 years. Some types of HPV increase your risk of cervical cancer. Testing for HPV may also be done on women of any age with unclear Pap test results.  Other health care providers may not recommend any screening for nonpregnant women who are considered low risk for pelvic cancer and who do not have symptoms. Ask your health care provider if a screening pelvic exam is right for you.  If you have had past treatment for cervical cancer or a condition that could lead to cancer, you need Pap tests and screening for cancer for at least 20 years after your treatment. If Pap tests have been discontinued, your risk factors (such as having a new sexual partner) need to be reassessed to determine if screening should resume. Some women have medical problems that increase the chance of getting cervical cancer. In these cases, your health care provider may recommend more frequent screening and Pap tests. Colorectal Cancer  This type of cancer can be detected and often prevented.  Routine colorectal cancer screening usually begins at 75 years of age and continues through 75 years of age.  Your health care provider may recommend  screening at an earlier age if you have risk factors for colon cancer.  Your health care provider may also recommend using home test kits to check for hidden blood in the stool.  A small camera at the end of a tube can be used to examine your colon directly (sigmoidoscopy or colonoscopy). This is done to check for the earliest forms of colorectal cancer.  Routine screening usually begins at age 52.  Direct examination of the colon should be repeated every 5-10 years through 75 years of age. However, you may need to be screened more often if early forms of precancerous polyps or small growths are found. Skin Cancer  Check your skin from head to toe regularly.  Tell your health care provider about any new moles or changes in moles, especially if there is a change in a mole's shape or color.  Also tell your health care provider if you have a mole that is larger than the size of a pencil eraser.  Always  use sunscreen. Apply sunscreen liberally and repeatedly throughout the day.  Protect yourself by wearing long sleeves, pants, a wide-brimmed hat, and sunglasses whenever you are outside. HEART DISEASE, DIABETES, AND HIGH BLOOD PRESSURE   High blood pressure causes heart disease and increases the risk of stroke. High blood pressure is more likely to develop in:  People who have blood pressure in the high end of the normal range (130-139/85-89 mm Hg).  People who are overweight or obese.  People who are African American.  If you are 45-69 years of age, have your blood pressure checked every 3-5 years. If you are 45 years of age or older, have your blood pressure checked every year. You should have your blood pressure measured twice--once when you are at a hospital or clinic, and once when you are not at a hospital or clinic. Record the average of the two measurements. To check your blood pressure when you are not at a hospital or clinic, you can use:  An automated blood pressure machine at a  pharmacy.  A home blood pressure monitor.  If you are between 78 years and 62 years old, ask your health care provider if you should take aspirin to prevent strokes.  Have regular diabetes screenings. This involves taking a blood sample to check your fasting blood sugar level.  If you are at a normal weight and have a low risk for diabetes, have this test once every three years after 75 years of age.  If you are overweight and have a high risk for diabetes, consider being tested at a younger age or more often. PREVENTING INFECTION  Hepatitis B  If you have a higher risk for hepatitis B, you should be screened for this virus. You are considered at high risk for hepatitis B if:  You were born in a country where hepatitis B is common. Ask your health care provider which countries are considered high risk.  Your parents were born in a high-risk country, and you have not been immunized against hepatitis B (hepatitis B vaccine).  You have HIV or AIDS.  You use needles to inject street drugs.  You live with someone who has hepatitis B.  You have had sex with someone who has hepatitis B.  You get hemodialysis treatment.  You take certain medicines for conditions, including cancer, organ transplantation, and autoimmune conditions. Hepatitis C  Blood testing is recommended for:  Everyone born from [birth year] through 1965.  Anyone with known risk factors for hepatitis C. Sexually transmitted infections (STIs)  You should be screened for sexually transmitted infections (STIs) including gonorrhea and chlamydia if:  You are sexually active and are younger than 75 years of age.  You are older than 75 years of age and your health care provider tells you that you are at risk for this type of infection.  Your sexual activity has changed since you were last screened and you are at an increased risk for chlamydia or gonorrhea. Ask your health care provider if you are at risk.  If you do not  have HIV, but are at risk, it may be recommended that you take a prescription medicine daily to prevent HIV infection. This is called pre-exposure prophylaxis (PrEP). You are considered at risk if:  You are sexually active and do not regularly use condoms or know the HIV status of your partner(s).  You take drugs by injection.  You are sexually active with a partner who has HIV. Talk with your health care  provider about whether you are at high risk of being infected with HIV. If you choose to begin PrEP, you should first be tested for HIV. You should then be tested every 3 months for as long as you are taking PrEP.  PREGNANCY   If you are premenopausal and you may become pregnant, ask your health care provider about preconception counseling.  If you may become pregnant, take 400 to 800 micrograms (mcg) of folic acid every day.  If you want to prevent pregnancy, talk to your health care provider about birth control (contraception). OSTEOPOROSIS AND MENOPAUSE   Osteoporosis is a disease in which the bones lose minerals and strength with aging. This can result in serious bone fractures. Your risk for osteoporosis can be identified using a bone density scan.  If you are 79 years of age or older, or if you are at risk for osteoporosis and fractures, ask your health care provider if you should be screened.  Ask your health care provider whether you should take a calcium or vitamin D supplement to lower your risk for osteoporosis.  Menopause may have certain physical symptoms and risks.  Hormone replacement therapy may reduce some of these symptoms and risks. Talk to your health care provider about whether hormone replacement therapy is right for you.  HOME CARE INSTRUCTIONS   Schedule regular health, dental, and eye exams.  Stay current with your immunizations.   Do not use any tobacco products including cigarettes, chewing tobacco, or electronic cigarettes.  If you are pregnant, do not  drink alcohol.  If you are breastfeeding, limit how much and how often you drink alcohol.  Limit alcohol intake to no more than 1 drink per day for nonpregnant women. One drink equals 12 ounces of beer, 5 ounces of wine, or 1 ounces of hard liquor.  Do not use street drugs.  Do not share needles.  Ask your health care provider for help if you need support or information about quitting drugs.  Tell your health care provider if you often feel depressed.  Tell your health care provider if you have ever been abused or do not feel safe at home.   This information is not intended to replace advice given to you by your health care provider. Make sure you discuss any questions you have with your health care provider.   Document Released: 07/25/2010 Document Revised: 01/30/2014 Document Reviewed: 12/11/2012 Elsevier Interactive Patient Education [employer].

## 2015-07-14 ENCOUNTER — Other Ambulatory Visit: Payer: Self-pay | Admitting: Internal Medicine

## 2015-07-15 NOTE — Telephone Encounter (Signed)
Sent to the pharmacy by e-scribe. 

## 2015-08-10 ENCOUNTER — Other Ambulatory Visit: Payer: Self-pay | Admitting: Internal Medicine

## 2015-08-11 ENCOUNTER — Encounter: Payer: Medicare Other | Admitting: Internal Medicine

## 2015-08-30 ENCOUNTER — Other Ambulatory Visit: Payer: Self-pay | Admitting: Obstetrics and Gynecology

## 2015-08-30 DIAGNOSIS — Z1231 Encounter for screening mammogram for malignant neoplasm of breast: Secondary | ICD-10-CM

## 2015-10-11 ENCOUNTER — Ambulatory Visit
Admission: RE | Admit: 2015-10-11 | Discharge: 2015-10-11 | Disposition: A | Discharge status: Home / Self Care | Payer: Medicare Other | Source: Ambulatory Visit | Attending: Obstetrics and Gynecology | Admitting: Obstetrics and Gynecology

## 2015-10-11 DIAGNOSIS — Z1231 Encounter for screening mammogram for malignant neoplasm of breast: Secondary | ICD-10-CM | POA: Diagnosis not present

## 2015-11-22 IMAGING — CR DG CHEST 2V
2 series · 2 of 2 positions shown · non-contrast
Comparison: 11/08/2011 and earlier.

CLINICAL DATA: 72-year-old female cough and fever. Initial
encounter. Wheezing.

EXAM:
CHEST  2 VIEW

[view not recorded (1 of 2)]
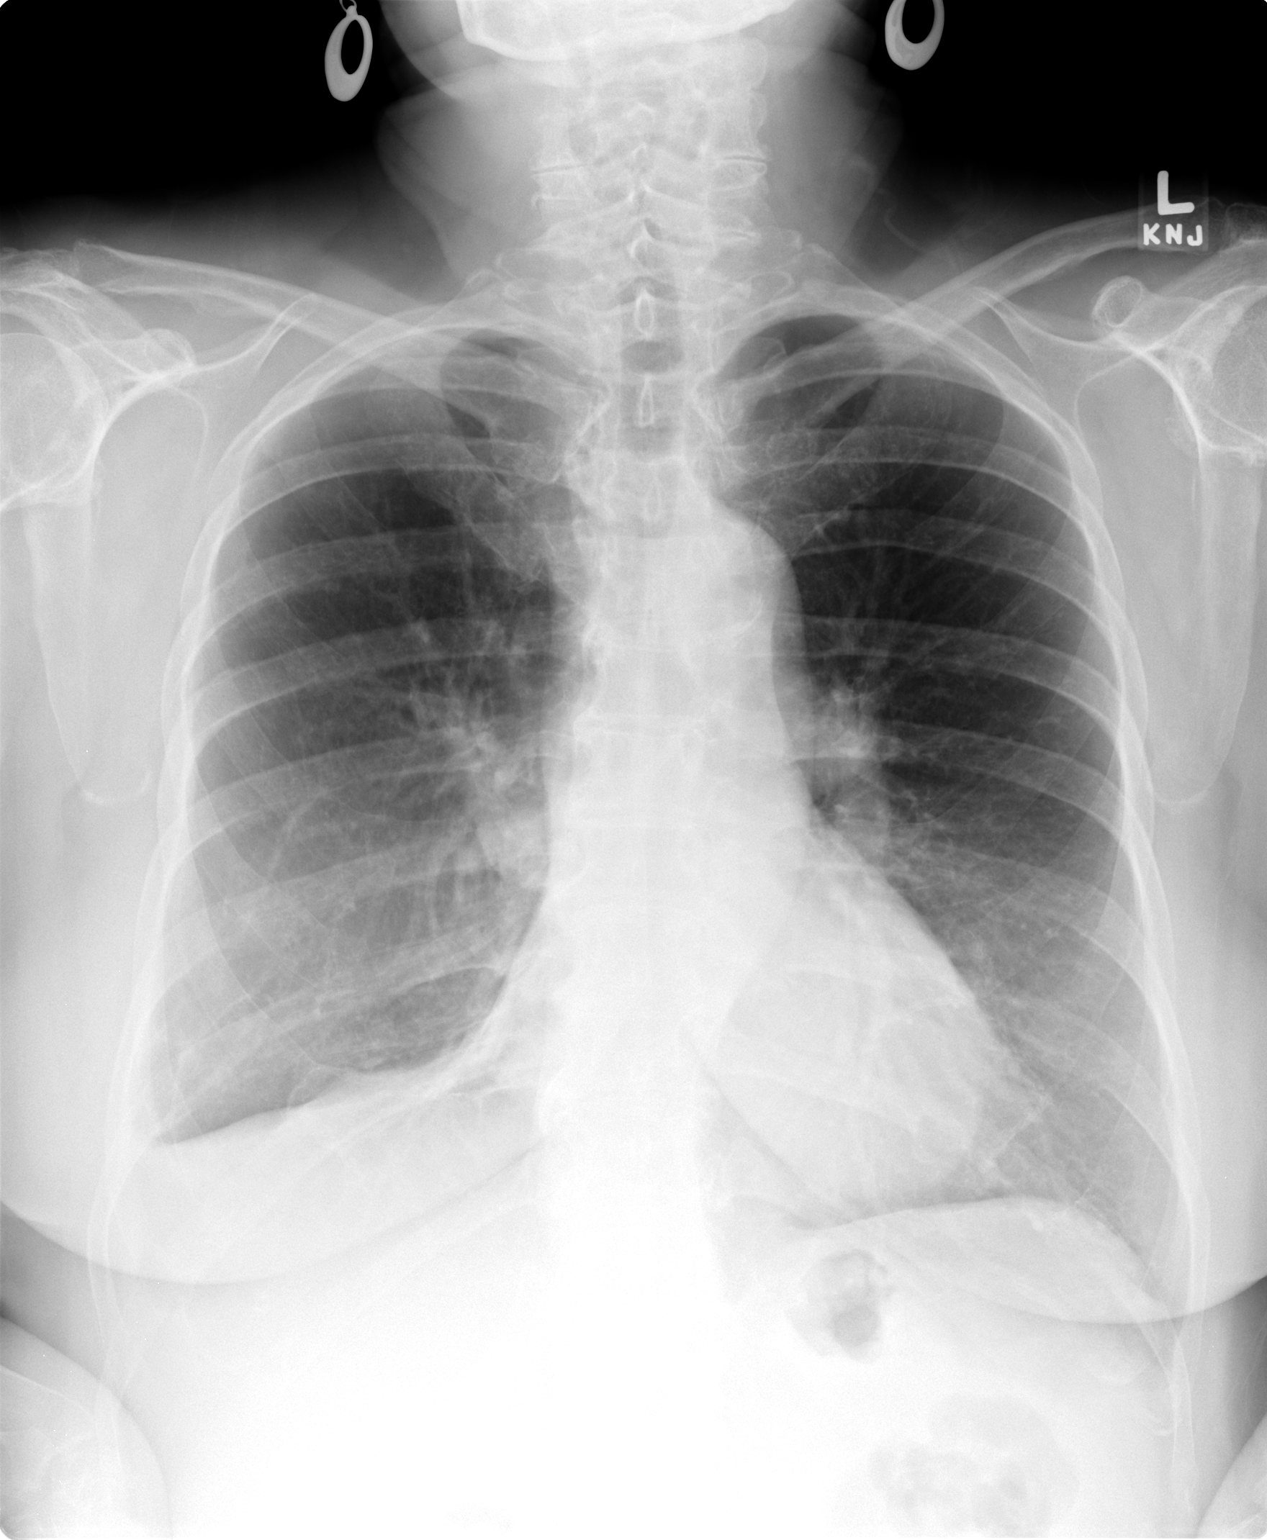

[view not recorded (2 of 2)]
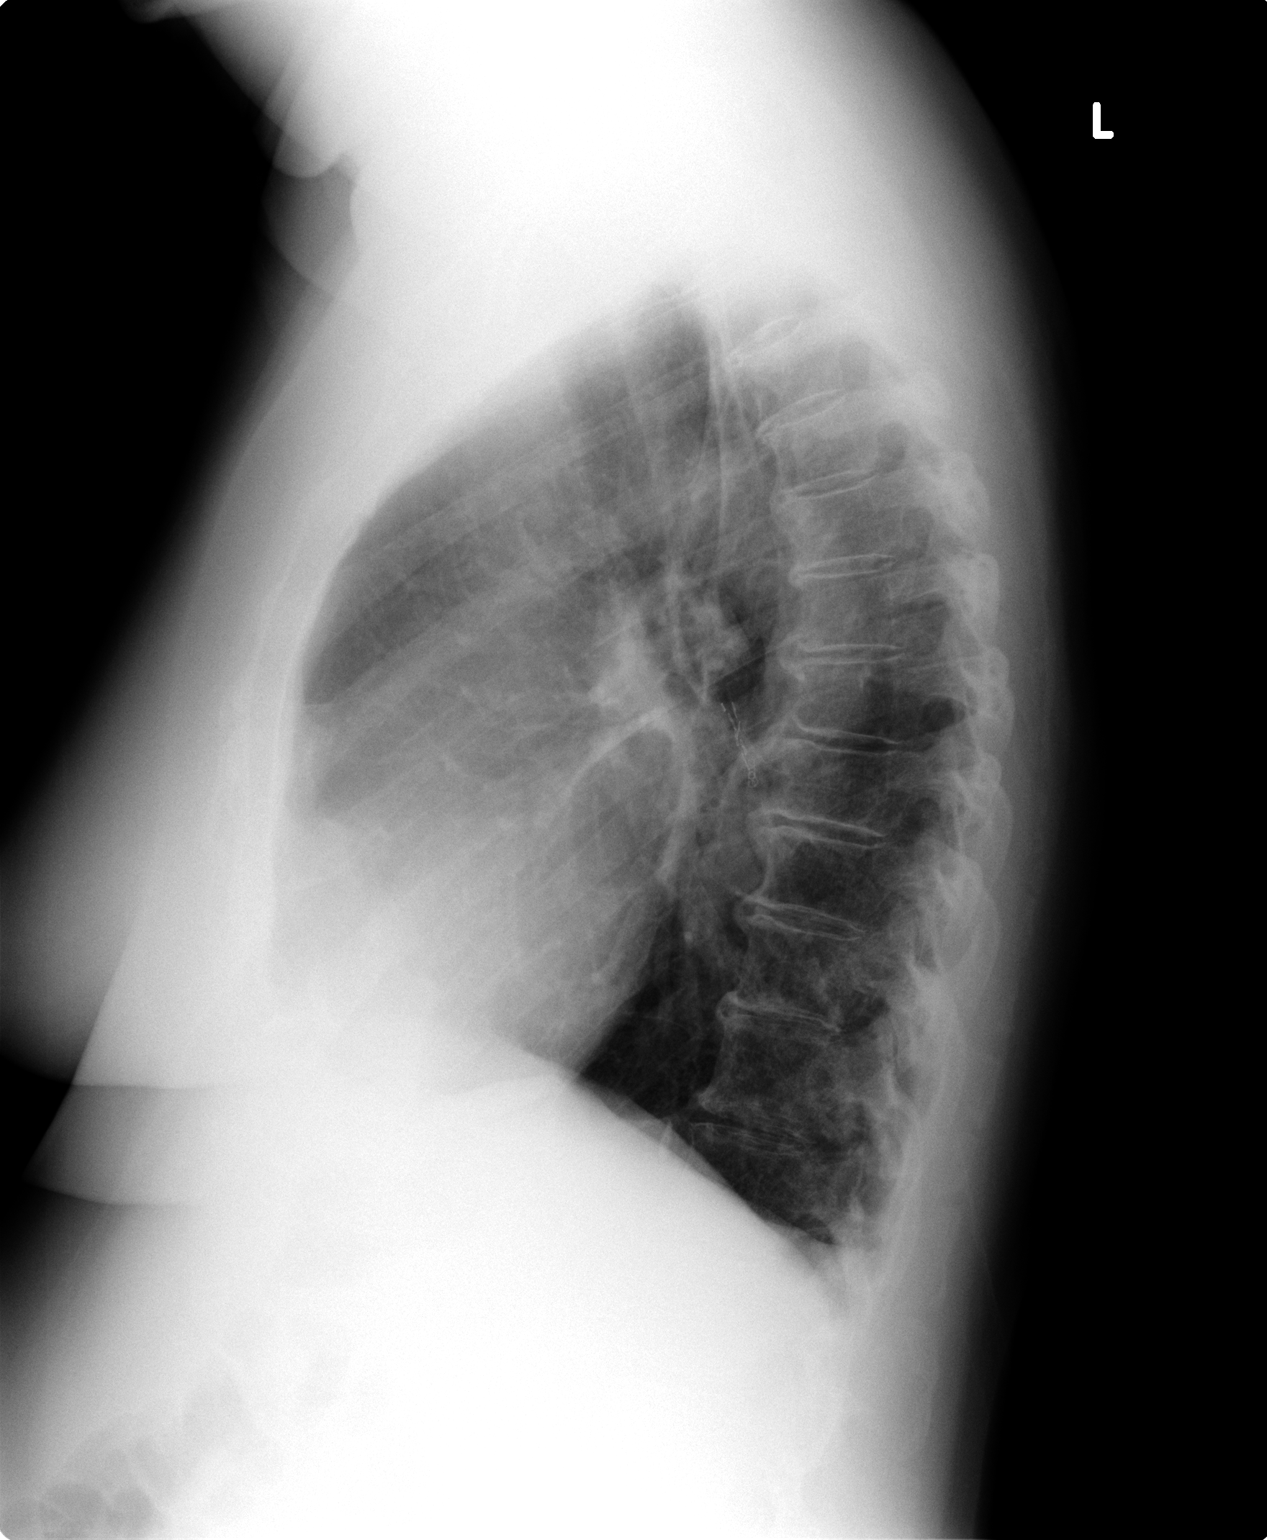

[2 of 2 positions shown; findings below may reference images not displayed]

FINDINGS: Stable lung volumes. Normal cardiac size and mediastinal contours.
Visualized tracheal air column is within normal limits. Chronic
staple line on the lateral view posterior to the carina. No
pneumothorax, pulmonary edema, pleural effusion or acute pulmonary
opacity. No acute osseous abnormality identified.
IMPRESSION: No acute cardiopulmonary abnormality.

## 2016-01-10 DIAGNOSIS — N390 Urinary tract infection, site not specified: Secondary | ICD-10-CM | POA: Diagnosis not present

## 2016-01-10 DIAGNOSIS — Z23 Encounter for immunization: Secondary | ICD-10-CM | POA: Diagnosis not present

## 2016-01-10 DIAGNOSIS — J209 Acute bronchitis, unspecified: Secondary | ICD-10-CM | POA: Diagnosis not present

## 2016-05-23 ENCOUNTER — Ambulatory Visit (INDEPENDENT_AMBULATORY_CARE_PROVIDER_SITE_OTHER): Payer: Medicare Other | Admitting: Family Medicine

## 2016-05-23 ENCOUNTER — Encounter: Payer: Self-pay | Admitting: Family Medicine

## 2016-05-23 ENCOUNTER — Ambulatory Visit (INDEPENDENT_AMBULATORY_CARE_PROVIDER_SITE_OTHER)
Admission: RE | Admit: 2016-05-23 | Discharge: 2016-05-23 | Disposition: A | Discharge status: Home / Self Care | Payer: Medicare Other | Source: Ambulatory Visit | Attending: Family Medicine | Admitting: Family Medicine

## 2016-05-23 ENCOUNTER — Telehealth: Payer: Self-pay | Admitting: Internal Medicine

## 2016-05-23 VITALS — BP 138/70 | HR 73 | Temp 98.3°F | Ht 62.5 in | Wt 148.7 lb

## 2016-05-23 DIAGNOSIS — R300 Dysuria: Secondary | ICD-10-CM | POA: Diagnosis not present

## 2016-05-23 DIAGNOSIS — J4521 Mild intermittent asthma with (acute) exacerbation: Secondary | ICD-10-CM | POA: Diagnosis not present

## 2016-05-23 DIAGNOSIS — R059 Cough, unspecified: Secondary | ICD-10-CM

## 2016-05-23 DIAGNOSIS — R05 Cough: Secondary | ICD-10-CM

## 2016-05-23 LAB — POCT URINALYSIS DIPSTICK
Bilirubin, UA: NEGATIVE
GLUCOSE UA: NEGATIVE
Ketones, UA: NEGATIVE
NITRITE UA: NEGATIVE
Protein, UA: NEGATIVE
Spec Grav, UA: 1.01 (ref 1.010–1.025)
UROBILINOGEN UA: 0.2 U/dL
pH, UA: 7.5 (ref 5.0–8.0)

## 2016-05-23 MED ORDER — NITROFURANTOIN MONOHYD MACRO 100 MG PO CAPS: 100.0000 mg | ORAL_CAPSULE | Freq: Two times a day (BID) | ORAL | 0 refills | Status: DC

## 2016-05-23 MED ORDER — PREDNISONE 20 MG PO TABS: 40.0000 mg | ORAL_TABLET | Freq: Every day | ORAL | 0 refills | Status: DC

## 2016-05-23 NOTE — Progress Notes (Signed)
Pre visit review using our clinic review tool, if applicable. No additional management support is needed unless otherwise documented below in the visit note. 

## 2016-05-23 NOTE — Telephone Encounter (Signed)
The Rxs that were prescribed by Dr Maudie Mercury were re-sent to [facility].  According to her chart no refills are needed at this time on Toprol (last Rx given 07/2015 for 1 year).

## 2016-05-23 NOTE — Telephone Encounter (Signed)
° ° ° ° ° °  Below med was sent to [facility] and should have been sent to the below pharmacy   [facility]    predniSONE (DELTASONE) 20 MG tablet   TOPROL XL 50 MG 24 hr tablet

## 2016-05-23 NOTE — Patient Instructions (Signed)
BEFORE YOU LEAVE: -xray sheet  Go get the xray.  Take the Macrobid antibiotic for the urine symptoms.  Take the prednisone for the asthma.  I hope you are feeling better soon! Seek care immediately if worsening, new concerns or you are not improving with treatment.

## 2016-05-23 NOTE — Progress Notes (Signed)
HPI:  Acute visit for several issues:  1) Dysuria: -x3 days -symptoms: burning, frequency, urgency - azo helps -denies: fevers, hematuria, vag discharge, abd/pelvic/flank pain, NVD -reports hx UTI and prefers macrobid for this as reports some other abx make her sick on her stomach  2) Cough/Wheezing: -x1 week -symptoms: nasal congestion, PND, cough, wheezing -reported hx asthma and lobectomy for noncancer dx remotely - worries about PNA when gets a cough -reports can not take albuterol -denies: fevers, body aches, thick sputum, malaise, hemoptysis, SOB, DOE  ROS: See pertinent positives and negatives per HPI.  Past Medical History:  Diagnosis Date  . Abnormal LFTs    fatty liver on [place] MRI faay liver and hemangioma 2008 biopsy 2011  . Asthma    allergy  . Benign tumor   . Bladder polyps   . Blood transfusion 1969  . Blood transfusion abn reaction or complication, no procedure mishap   . Cancer (HCC)    squameous cell lsft leg hx  . Colon polyp 2009   Tubulovillous adenoma   . Diverticulosis of colon   . Eczema   . Fatty liver disease, nonalcoholic    confirmed biopsy 2011 mild no fibrosis   . GERD (gastroesophageal reflux disease)   . Hematuria    urethral polyps  . HT (hammer toe)   . Hypertension   . Kidney disease    nephritis as  child. polpys, hematuria  . Lichen sclerosus    gyne care topical steroids  . Migraines    hx migraines  . Osteoarthritis   . Shortness of breath    partial lower rt lung lobectomy.  . Status post partial lobectomy of lung    for bronchiesctesis  . Urinary incontinence   . UTI (lower urinary tract infection)     Past Surgical History:  Procedure Laterality Date  . APPENDECTOMY    . caesarean section     X 3  . KNEE ARTHROSCOPY  12   rt  . left knee surgery on torn mansicus  11  . LUMBAR LAMINECTOMY/DECOMPRESSION MICRODISCECTOMY  03/23/2011   Procedure: LUMBAR LAMINECTOMY/DECOMPRESSION MICRODISCECTOMY 1 LEVEL;  Surgeon:  Peggyann Shoals, MD;  Location: [facility];  Service: Neurosurgery;  Laterality: Left;  Left Lumbar four-five laminectomy and microdiscectomy  . PERCUTANEOUS LIVER BIOPSY  2011   fatty liver no fibrosis  . rt knee surgery  2012  . rt lung lobectomy     for bronchiectasis  . VAGINAL HYSTERECTOMY      Family History  Problem Relation Age of Onset  . Cancer Mother     lung  . Heart disease Mother   . Diabetes Paternal Grandfather   . Cancer Paternal Grandfather     bladder  . Mental retardation Other     Social History   Social History  . Marital status: Married    Spouse name: N/A  . Number of children: N/A  . Years of education: N/A   Social History Main Topics  . Smoking status: Never Smoker  . Smokeless tobacco: Never Used     Comment: wine q 2-3 weeks  . Alcohol use 0.0 oz/week     Comment: 4-5 per year   . Drug use: No  . Sexual activity: Not Asked     Comment: hysterectomy   Other Topics Concern  . 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  cetirizine (ZYRTEC) 10 MG tablet, Take 10 mg by mouth daily.  , Disp: , Rfl:  .  clobetasol (TEMOVATE) 0.05 % cream, Apply 1 application topically 2 (two) times daily as needed. For irritation of skin , Disp: , Rfl:  .  hydrochlorothiazide (HYDRODIURIL) 25 MG tablet, TAKE 1 TABLET DAILY, Disp: 90 tablet, Rfl: 2 .  NEXIUM 40 MG capsule, TAKE 1 CAPSULE DAILY BEFORE BREAKFAST, Disp: 90 capsule, Rfl: 1 .  rosuvastatin (CRESTOR) 10 MG tablet, TAKE 1 TABLET DAILY, Disp: 90 tablet, Rfl: 2 .  TOPROL XL 50 MG 24 hr tablet, TAKE 1 TABLET DAILY WITH OR IMMEDIATELY FOLLOWING A MEAL, Disp: 90 tablet, Rfl: 3 .  nitrofurantoin, macrocrystal-monohydrate, (MACROBID) 100 MG capsule, Take 1 capsule (100 mg total) by mouth 2 (two) times daily., Disp: 14 capsule, Rfl: 0 .   predniSONE (DELTASONE) 20 MG tablet, Take 2 tablets (40 mg total) by mouth daily with breakfast., Disp: 8 tablet, Rfl: 0  EXAM:  Vitals:   05/23/16 1138  BP: 138/70  Pulse: 73  Temp: 98.3 F (36.8 C)    Body mass index is 26.76 kg/m.  GENERAL: vitals reviewed and listed above, alert, oriented, appears well hydrated and in no acute distress  HEENT: atraumatic, conjunttiva clear, no obvious abnormalities on inspection of external nose and ears, normal appearance of ear canals and TMs, clear nasal congestion, mild post oropharyngeal erythema with PND, no tonsillar edema or exudate, no sinus TTP  NECK: no obvious masses on inspection  LUNGS: diffuse scattered exp wheeze, no appreciable rhonchi or rales., no obvious signs resp distress  CV: HRRR, no peripheral edema  ABD: BS+, soft, NTTP, no CVA TTP  MS: moves all extremities without noticeable abnormality  PSYCH: pleasant and cooperative, no obvious depression or anxiety  ASSESSMENT AND PLAN:  Discussed the following assessment and plan:  Dysuria - Plan: POC Urinalysis Dipstick -udip + symptoms c/w with likely UTI, she opted for empiric tx and prefers macrobid, culture pending, return precautions discussed  Mild intermittent asthma with acute exacerbation -wheezing and hx asthma, she reports cant do inhalers -opted for prednisone -no sig signs of bacterial infection, but will get CXR given her concerns -return precuations  Cough - Plan: DG Chest 2 View  -Patient advised to return or notify a doctor immediately if symptoms worsen or persist or new concerns arise.  Patient Instructions  BEFORE YOU LEAVE: -xray sheet  Go get the xray.  Take the Macrobid antibiotic for the urine symptoms.  Take the prednisone for the asthma.  I hope you are feeling better soon! Seek care immediately if worsening, new concerns or you are not improving with treatment.        Colin Benton R., DO

## 2016-05-25 LAB — URINE CULTURE

## 2016-06-05 DIAGNOSIS — Z85828 Personal history of other malignant neoplasm of skin: Secondary | ICD-10-CM | POA: Diagnosis not present

## 2016-06-05 DIAGNOSIS — L570 Actinic keratosis: Secondary | ICD-10-CM | POA: Diagnosis not present

## 2016-06-05 DIAGNOSIS — L853 Xerosis cutis: Secondary | ICD-10-CM | POA: Diagnosis not present

## 2016-07-11 DIAGNOSIS — Z1389 Encounter for screening for other disorder: Secondary | ICD-10-CM | POA: Diagnosis not present

## 2016-07-11 DIAGNOSIS — L900 Lichen sclerosus et atrophicus: Secondary | ICD-10-CM | POA: Diagnosis not present

## 2016-07-11 DIAGNOSIS — Z01419 Encounter for gynecological examination (general) (routine) without abnormal findings: Secondary | ICD-10-CM | POA: Diagnosis not present

## 2016-07-12 ENCOUNTER — Encounter: Payer: Self-pay | Admitting: Family Medicine

## 2016-07-17 DIAGNOSIS — D225 Melanocytic nevi of trunk: Secondary | ICD-10-CM | POA: Diagnosis not present

## 2016-07-17 DIAGNOSIS — Z85828 Personal history of other malignant neoplasm of skin: Secondary | ICD-10-CM | POA: Diagnosis not present

## 2016-07-17 DIAGNOSIS — L814 Other melanin hyperpigmentation: Secondary | ICD-10-CM | POA: Diagnosis not present

## 2016-07-17 DIAGNOSIS — L821 Other seborrheic keratosis: Secondary | ICD-10-CM | POA: Diagnosis not present

## 2016-07-20 ENCOUNTER — Telehealth: Payer: Self-pay | Admitting: Internal Medicine

## 2016-07-20 NOTE — Telephone Encounter (Signed)
Patient is calling to get a refill for her :hydrochlorothiazide (HYDRODIURIL) 25 MG tablet and rosuvastatin (CRESTOR) 10 MG tablet.  Patient is still using [facility] however she is completely out now with no refills and asking if we can send a 1 month supply to [facility] and then the other refills can go back to [facility].

## 2016-07-21 ENCOUNTER — Other Ambulatory Visit: Payer: Self-pay | Admitting: Emergency Medicine

## 2016-07-21 MED ORDER — ROSUVASTATIN CALCIUM 10 MG PO TABS: 10.0000 mg | ORAL_TABLET | Freq: Every day | ORAL | 0 refills | Status: DC

## 2016-07-21 MED ORDER — HYDROCHLOROTHIAZIDE 25 MG PO TABS: 25.0000 mg | ORAL_TABLET | Freq: Every day | ORAL | 0 refills | Status: DC

## 2016-07-21 NOTE — Telephone Encounter (Signed)
Sent a 30 day supply in for each medication. Patient is over due for an yearly visit. Please help patient schedule appointment. Thank you

## 2016-07-21 NOTE — Telephone Encounter (Signed)
Pt has been sch

## 2016-08-14 NOTE — Progress Notes (Signed)
Chief Complaint  Patient presents with  . Annual Exam    HPI: Madison Erickson 76 y.o. come in for Chronic disease management  Yearly medical visit   bp :usually good    Usually   Good    HLD : Taking med   Stopped for a while   And then   [facility] back on  After x ray showed plaque incidental  No se  ( had se of atorva)  Gi taking nexium qod ok  Hx polyps baldder no fu for a few ys no sx  Asks to check ua today .   Will attending to   Eating  As has hx of  FLD   ROS: See pertinent positives and negatives per HPI.  r shoulder  Problematic  Pain  Sees dr R gyne  Reg  hh of 2  And daughter .    Broken ankle .     So caretaking  But otherwise usually more exercise  Tobacco   etoh little .  Sugar   Beverages   No     Sleep 8 hours most days  Walking    Past Medical History:  Diagnosis Date  . Abnormal LFTs    fatty liver on [place] MRI faay liver and hemangioma 2008 biopsy 2011  . Asthma    allergy  . Benign tumor   . Bladder polyps   . Blood transfusion 1969  . Blood transfusion abn reaction or complication, no procedure mishap   . Cancer (HCC)    squameous cell lsft leg hx  . Colon polyp 2009   Tubulovillous adenoma   . Diverticulosis of colon   . Eczema   . Fatty liver disease, nonalcoholic    confirmed biopsy 2011 mild no fibrosis   . GERD (gastroesophageal reflux disease)   . Hematuria    urethral polyps  . HT (hammer toe)   . Hypertension   . Kidney disease    nephritis as  child. polpys, hematuria  . Lichen sclerosus    gyne care topical steroids  . Migraines    hx migraines  . Osteoarthritis   . Shortness of breath    partial lower rt lung lobectomy.  . Status post partial lobectomy of lung    for bronchiesctesis  . Urinary incontinence   . UTI (lower urinary tract infection)     Family History  Problem Relation Age of Onset  . Cancer Mother        lung  . Heart disease Mother   . Diabetes Paternal Grandfather   . Cancer Paternal Grandfather        bladder    . Mental retardation Other     Social History   Social History  . Marital status: Married    Spouse name: N/A  . Number of children: N/A  . Years of education: N/A   Social History Main Topics  . Smoking status: Never Smoker  . Smokeless tobacco: Never Used     Comment: wine q 2-3 weeks  . Alcohol use 0.0 oz/week     Comment: 4-5 per year   . Drug use: No  . Sexual activity: Not Asked     Comment: hysterectomy   Other Topics Concern  . 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 cetirizine (ZYRTEC) 10 MG tablet Take 10 mg by mouth daily.      . clobetasol (TEMOVATE) 0.05 % cream Apply 1 application topically 2 (two) times daily as needed. For irritation of skin     . hydrochlorothiazide (HYDRODIURIL) 25 MG tablet Take 1 tablet (25 mg total) by mouth daily. 30 tablet 0  . NEXIUM 40 MG capsule TAKE 1 CAPSULE DAILY BEFORE BREAKFAST 90 capsule 1  . rosuvastatin (CRESTOR) 10 MG tablet Take 1 tablet (10 mg total) by mouth daily. 30 tablet 0  . TOPROL XL 50 MG 24 hr tablet TAKE 1 TABLET DAILY WITH OR IMMEDIATELY FOLLOWING A MEAL 90 tablet 3  . nitrofurantoin, macrocrystal-monohydrate, (MACROBID) 100 MG capsule Take 1 capsule (100 mg total) by mouth 2 (two) times daily. 14 capsule 0  . predniSONE (DELTASONE) 20 MG tablet Take 2 tablets (40 mg total) by mouth daily with breakfast. 8 tablet 0   No facility-administered medications prior to visit.      EXAM:  BP 138/70 (BP Location: Right Arm)   Pulse 66   Temp 97.6 F (36.4 C) (Oral)   Ht 5' 2.5" (1.588 m)   Wt 146 lb 3.2 oz (66.3 kg)   BMI 26.31 kg/m   Body mass index is 26.31 kg/m. Physical Exam: Vital signs reviewed HWE:XHBZ is a well-developed well-nourished alert cooperative  female who appears her stated age in  no acute distress.  HEENT: normocephalic atraumatic , Eyes: PERRL EOM's full, conjunctiva clear, glassess  Nares: paten,t no deformity discharge or tenderness., Ears: no deformity EAC's clear TMs with normal landmarks. Mouth: clear OP, no lesions, edema.  Moist mucous membranes. Dentition in adequate repair. NECK: supple without masses, thyromegaly or bruits.Breast: normal by inspection . No dimpling, discharge, masses, tenderness or discharge . CHEST/PULM:  Clear to auscultation and percussion breath sounds equal no wheeze , rales or rhonchi. No chest wall deformities or tenderness. CV: PMI is nondisplaced, S1 S2 no gallops, murmurs, rubs. Peripheral pulses are full without delay.No JVD .  ABDOMEN: Bowel sounds normal nontender  No guard or rebound, no hepato splenomegal no CVA tenderness.   Extremtities:  No clubbing cyanosis or edema, no acute joint swelling or redness no focal atrophy achilles thickening  r  NEURO:  Oriented x3, cranial nerves 3-12 appear to be intact, no obvious focal weakness,gait within normal limits no abnormal reflexes or asymmetrical SKIN: No acute rashes normal turgor, color, no bruising or petechiae. PSYCH: Oriented, good eye contact, no obvious depression anxiety, cognition and judgment appear normal. LN: no cervical axillary inguinal adenopathy     G Lab Results  Component Value Date   WBC 8.1 07/05/2015   HGB 14.2 07/05/2015   HCT 42.3 07/05/2015   PLT 258.0 07/05/2015   GLUCOSE 112 (H) 07/05/2015   CHOL 125 07/05/2015   TRIG 122.0 07/05/2015   HDL 31.20 (L) 07/05/2015   LDLDIRECT 201.9 10/26/2008   LDLCALC 69 07/05/2015   ALT 19 07/05/2015   AST 19 07/05/2015   NA 139 07/05/2015   K 3.5 07/05/2015   CL 103 07/05/2015   CREATININE 0.64 07/05/2015   BUN 14 07/05/2015   CO2 29 07/05/2015   TSH 1.92 07/05/2015   INR 0.96 01/29/2009   HGBA1C 5.8 07/05/2015   BP Readings from Last 3 Encounters:  08/15/16 138/70  05/23/16 138/70  07/07/15 138/70    Wt Readings from Last 3 Encounters:  08/15/16 146 lb  3.2 oz (66.3 kg)  05/23/16 148 lb 11.2 oz (67.4 kg)  07/07/15 148 lb 11.2 oz (67.4 kg)     ASSESSMENT AND PLAN:  Discussed the following assessment and plan:  Hyperlipidemia, unspecified hyperlipidemia type - w incidental finding plaque on x ray  - Plan: Basic metabolic panel, CBC with Differential/Platelet, Hemoglobin A1c, Hepatic function panel, Lipid panel, TSH, POCT Urinalysis Dipstick (Automated)  Essential hypertension - consider ace  if becomes diabeteic  - Plan: Basic metabolic panel, CBC with Differential/Platelet, Hemoglobin A1c, Hepatic function panel, Lipid panel, TSH, POCT Urinalysis Dipstick (Automated)  Fasting hyperglycemia - Plan: Basic metabolic panel, CBC with Differential/Platelet, Hemoglobin A1c, Hepatic function panel, Lipid panel, TSH, POCT Urinalysis Dipstick (Automated)  Fatty liver disease, nonalcoholic - Plan: Basic metabolic panel, CBC with Differential/Platelet, Hemoglobin A1c, Hepatic function panel, Lipid panel, TSH, POCT Urinalysis Dipstick (Automated)  Bladder polyps - Plan: Basic metabolic panel, CBC with Differential/Platelet, Hemoglobin A1c, Hepatic function panel, Lipid panel, TSH, POCT Urinalysis Dipstick (Automated)  Medication management - Plan: Basic metabolic panel, CBC with Differential/Platelet, Hemoglobin A1c, Hepatic function panel, Lipid panel, TSH, POCT Urinalysis Dipstick (Automated)  -Patient advised to return or notify health care team  if  new concerns arise.  Patient Instructions  Continue lifestyle intervention healthy eating and exercise .   Will notify you  of labs when available. Make sure bp readings  Closer to 120/80    135/85 not considered elevated .   Check into shingles vaccine. Shingrix  Can receive either in the office or pharmacy depending on insurance rules.   May want to get back with urologist cause of hx of baldder polyps   Preventive Care 79 Years and  Older, Female Preventive care refers to lifestyle choices and visits with your health care provider that can promote health and wellness. What does preventive care include?  A yearly physical exam. This is also called an annual well check.  Dental exams once or twice a year.  Routine eye exams. Ask your health care provider how often you should have your eyes checked.  Personal lifestyle choices, including: ? Daily care of your teeth and gums. ? Regular physical activity. ? Eating a healthy diet. ? Avoiding tobacco and drug use. ? Limiting alcohol use. ? Practicing safe sex. ? Taking low-dose aspirin every day. ? Taking vitamin and mineral supplements as recommended by your health care provider. What happens during an annual well check? The services and screenings done by your health care provider during your annual well check will depend on your age, overall health, lifestyle risk factors, and family history of disease. Counseling Your health care provider may ask you questions about your:  Alcohol use.  Tobacco use.  Drug use.  Emotional well-being.  Home and relationship well-being.  Sexual activity.  Eating habits.  History of falls.  Memory and ability to understand (cognition).  Work and work Statistician.  Reproductive health.  Screening You may have the following tests or measurements:  Height, weight, and BMI.  Blood pressure.  Lipid and cholesterol levels. These may be checked every 5 years, or more frequently if you are over 28 years old.  Skin check.  Lung cancer screening. You may have this screening every year starting at age 58 if you have a 30-pack-year history of smoking and currently smoke or have quit within the past 15 years.  Fecal occult blood test (FOBT) of the stool. You may have this test every year starting at age 65.  Flexible sigmoidoscopy or  colonoscopy. You may have a sigmoidoscopy every 5 years or a colonoscopy every 10 years  starting at age 51.  Hepatitis C blood test.  Hepatitis B blood test.  Sexually transmitted disease (STD) testing.  Diabetes screening. This is done by checking your blood sugar (glucose) after you have not eaten for a while (fasting). You may have this done every 1-3 years.  Bone density scan. This is done to screen for osteoporosis. You may have this done starting at age 20.  Mammogram. This may be done every 1-2 years. Talk to your health care provider about how often you should have regular mammograms.  Talk with your health care provider about your test results, treatment options, and if necessary, the need for more tests. Vaccines Your health care provider may recommend certain vaccines, such as:  Influenza vaccine. This is recommended every year.  Tetanus, diphtheria, and acellular pertussis (Tdap, Td) vaccine. You may need a Td booster every 10 years.  Varicella vaccine. You may need this if you have not been vaccinated.  Zoster vaccine. You may need this after age 30.  Measles, mumps, and rubella (MMR) vaccine. You may need at least one dose of MMR if you were born in 1957 or later. You may also need a second dose.  Pneumococcal 13-valent conjugate (PCV13) vaccine. One dose is recommended after age 63.  Pneumococcal polysaccharide (PPSV23) vaccine. One dose is recommended after age 48.  Meningococcal vaccine. You may need this if you have certain conditions.  Hepatitis A vaccine. You may need this if you have certain conditions or if you travel or work in places where you may be exposed to hepatitis A.  Hepatitis B vaccine. You may need this if you have certain conditions or if you travel or work in places where you may be exposed to hepatitis B.  Haemophilus influenzae type b (Hib) vaccine. You may need this if you have certain conditions.  Talk to your health care provider about which screenings and vaccines you need and how often you need them. This information is  not intended to replace advice given to you by your health care provider. Make sure you discuss any questions you have with your health care provider. Document Released: 02/05/2015 Document Revised: 09/29/2015 Document Reviewed: 11/10/2014 Elsevier Interactive Patient Education  2017 [place] K. Panosh M.D.

## 2016-08-15 ENCOUNTER — Ambulatory Visit (INDEPENDENT_AMBULATORY_CARE_PROVIDER_SITE_OTHER): Payer: Medicare Other | Admitting: Internal Medicine

## 2016-08-15 ENCOUNTER — Encounter: Payer: Self-pay | Admitting: Internal Medicine

## 2016-08-15 VITALS — BP 138/70 | HR 66 | Temp 97.6°F | Ht 62.5 in | Wt 146.2 lb

## 2016-08-15 DIAGNOSIS — I10 Essential (primary) hypertension: Secondary | ICD-10-CM

## 2016-08-15 DIAGNOSIS — Z79899 Other long term (current) drug therapy: Secondary | ICD-10-CM

## 2016-08-15 DIAGNOSIS — D414 Neoplasm of uncertain behavior of bladder: Secondary | ICD-10-CM

## 2016-08-15 DIAGNOSIS — R7301 Impaired fasting glucose: Secondary | ICD-10-CM | POA: Diagnosis not present

## 2016-08-15 DIAGNOSIS — E785 Hyperlipidemia, unspecified: Secondary | ICD-10-CM

## 2016-08-15 DIAGNOSIS — K760 Fatty (change of) liver, not elsewhere classified: Secondary | ICD-10-CM | POA: Diagnosis not present

## 2016-08-15 LAB — BASIC METABOLIC PANEL
BUN: 13 mg/dL (ref 6–23)
CALCIUM: 10.3 mg/dL (ref 8.4–10.5)
CO2: 29 mEq/L (ref 19–32)
CREATININE: 0.6 mg/dL (ref 0.40–1.20)
Chloride: 102 mEq/L (ref 96–112)
GFR: 103.36 mL/min (ref >60.00)
GLUCOSE: 109 mg/dL — AB (ref 70–99)
Potassium: 3.7 mEq/L (ref 3.5–5.1)
Sodium: 138 mEq/L (ref 135–145)

## 2016-08-15 LAB — CBC WITH DIFFERENTIAL/PLATELET
BASOS PCT: 1.1 % (ref 0.0–3.0)
Basophils Absolute: 0.1 10*3/uL (ref 0.0–0.1)
EOS ABS: 0.2 10*3/uL (ref 0.0–0.7)
Eosinophils Relative: 2 % (ref 0.0–5.0)
HCT: 41.1 % (ref 36.0–46.0)
HEMOGLOBIN: 14 g/dL (ref 12.0–15.0)
LYMPHS ABS: 3 10*3/uL (ref 0.7–4.0)
Lymphocytes Relative: 29.6 % (ref 12.0–46.0)
MCHC: 34.2 g/dL (ref 30.0–36.0)
MCV: 87.1 fl (ref 78.0–100.0)
MONO ABS: 0.7 10*3/uL (ref 0.1–1.0)
Monocytes Relative: 6.6 % (ref 3.0–12.0)
NEUTROS PCT: 60.7 % (ref 43.0–77.0)
Neutro Abs: 6.1 10*3/uL (ref 1.4–7.7)
PLATELETS: 254 10*3/uL (ref 150.0–400.0)
RBC: 4.72 Mil/uL (ref 3.87–5.11)
RDW: 11.8 % (ref 11.5–15.5)
WBC: 10.1 10*3/uL (ref 4.0–10.5)

## 2016-08-15 LAB — POC URINALSYSI DIPSTICK (AUTOMATED)
BILIRUBIN UA: NEGATIVE
CLARITY UA: NEGATIVE
GLUCOSE UA: NEGATIVE
LEUKOCYTES UA: NEGATIVE
NITRITE UA: NEGATIVE
Protein, UA: NEGATIVE
Spec Grav, UA: 1.015 (ref 1.010–1.025)
Urobilinogen, UA: 0.2 E.U./dL
pH, UA: 6 (ref 5.0–8.0)

## 2016-08-15 LAB — HEPATIC FUNCTION PANEL
ALBUMIN: 4.5 g/dL (ref 3.5–5.2)
ALT: 16 U/L (ref 0–35)
AST: 17 U/L (ref 0–37)
Alkaline Phosphatase: 44 U/L (ref 39–117)
BILIRUBIN TOTAL: 0.6 mg/dL (ref 0.2–1.2)
Bilirubin, Direct: 0.1 mg/dL (ref 0.0–0.3)
Total Protein: 6.4 g/dL (ref 6.0–8.3)

## 2016-08-15 LAB — LIPID PANEL
CHOLESTEROL: 137 mg/dL (ref 0–200)
HDL: 34.9 mg/dL — AB (ref >39.00)
LDL Cholesterol: 75 mg/dL (ref 0–99)
NonHDL: 102.45
TRIGLYCERIDES: 135 mg/dL (ref 0.0–149.0)
Total CHOL/HDL Ratio: 4
VLDL: 27 mg/dL (ref 0.0–40.0)

## 2016-08-15 LAB — TSH: TSH: 1.11 u[IU]/mL (ref 0.35–4.50)

## 2016-08-15 LAB — HEMOGLOBIN A1C: HEMOGLOBIN A1C: 5.8 % (ref 4.6–6.5)

## 2016-08-15 NOTE — Patient Instructions (Signed)
Continue lifestyle intervention healthy eating and exercise .   Will notify you  of labs when available. Make sure bp readings  Closer to 120/80    135/85 not considered elevated .   Check into shingles vaccine. Shingrix  Can receive either in the office or pharmacy depending on insurance rules.   May want to get back with urologist cause of hx of baldder polyps   Preventive Care 76 Years and Older, Female Preventive care refers to lifestyle choices and visits with your health care provider that can promote health and wellness. What does preventive care include?  A yearly physical exam. This is also called an annual well check.  Dental exams once or twice a year.  Routine eye exams. Ask your health care provider how often you should have your eyes checked.  Personal lifestyle choices, including: ? Daily care of your teeth and gums. ? Regular physical activity. ? Eating a healthy diet. ? Avoiding tobacco and drug use. ? Limiting alcohol use. ? Practicing safe sex. ? Taking low-dose aspirin every day. ? Taking vitamin and mineral supplements as recommended by your health care provider. What happens during an annual well check? The services and screenings done by your health care provider during your annual well check will depend on your age, overall health, lifestyle risk factors, and family history of disease. Counseling Your health care provider may ask you questions about your:  Alcohol use.  Tobacco use.  Drug use.  Emotional well-being.  Home and relationship well-being.  Sexual activity.  Eating habits.  History of falls.  Memory and ability to understand (cognition).  Work and work Statistician.  Reproductive health.  Screening You may have the following tests or measurements:  Height, weight, and BMI.  Blood pressure.  Lipid and cholesterol levels. These may be checked every 5 years, or more frequently if you are over 60 years old.  Skin  check.  Lung cancer screening. You may have this screening every year starting at age 64 if you have a 30-pack-year history of smoking and currently smoke or have quit within the past 15 years.  Fecal occult blood test (FOBT) of the stool. You may have this test every year starting at age 51.  Flexible sigmoidoscopy or colonoscopy. You may have a sigmoidoscopy every 5 years or a colonoscopy every 10 years starting at age 14.  Hepatitis C blood test.  Hepatitis B blood test.  Sexually transmitted disease (STD) testing.  Diabetes screening. This is done by checking your blood sugar (glucose) after you have not eaten for a while (fasting). You may have this done every 1-3 years.  Bone density scan. This is done to screen for osteoporosis. You may have this done starting at age 70.  Mammogram. This may be done every 1-2 years. Talk to your health care provider about how often you should have regular mammograms.  Talk with your health care provider about your test results, treatment options, and if necessary, the need for more tests. Vaccines Your health care provider may recommend certain vaccines, such as:  Influenza vaccine. This is recommended every year.  Tetanus, diphtheria, and acellular pertussis (Tdap, Td) vaccine. You may need a Td booster every 10 years.  Varicella vaccine. You may need this if you have not been vaccinated.  Zoster vaccine. You may need this after age 80.  Measles, mumps, and rubella (MMR) vaccine. You may need at least one dose of MMR if you were born in 1957 or later. You  may also need a second dose.  Pneumococcal 13-valent conjugate (PCV13) vaccine. One dose is recommended after age 87.  Pneumococcal polysaccharide (PPSV23) vaccine. One dose is recommended after age 39.  Meningococcal vaccine. You may need this if you have certain conditions.  Hepatitis A vaccine. You may need this if you have certain conditions or if you travel or work in places  where you may be exposed to hepatitis A.  Hepatitis B vaccine. You may need this if you have certain conditions or if you travel or work in places where you may be exposed to hepatitis B.  Haemophilus influenzae type b (Hib) vaccine. You may need this if you have certain conditions.  Talk to your health care provider about which screenings and vaccines you need and how often you need them. This information is not intended to replace advice given to you by your health care provider. Make sure you discuss any questions you have with your health care provider. Document Released: 02/05/2015 Document Revised: 09/29/2015 Document Reviewed: 11/10/2014 Elsevier Interactive Patient Education  2017 [employer].

## 2016-08-22 ENCOUNTER — Telehealth: Payer: Self-pay | Admitting: Internal Medicine

## 2016-08-22 ENCOUNTER — Other Ambulatory Visit: Payer: Self-pay | Admitting: Emergency Medicine

## 2016-08-22 MED ORDER — ROSUVASTATIN CALCIUM 10 MG PO TABS: 10.0000 mg | ORAL_TABLET | Freq: Every day | ORAL | 2 refills | Status: DC

## 2016-08-22 MED ORDER — HYDROCHLOROTHIAZIDE 25 MG PO TABS
25.0000 mg | ORAL_TABLET | Freq: Every day | ORAL | 2 refills | Status: DC
Start: 2016-08-22 — End: 2017-05-09

## 2016-08-22 MED ORDER — METOPROLOL SUCCINATE ER 50 MG PO TB24: ORAL_TABLET | ORAL | 2 refills | Status: DC

## 2016-08-22 NOTE — Telephone Encounter (Signed)
° ° ° °  Pt had her physical and all of her RX was suppose to be sent in to [facility] but there were not   Please send in all of pts [facility]

## 2016-08-22 NOTE — Telephone Encounter (Signed)
Medications have been sent to pharmacy with refills.

## 2016-10-13 ENCOUNTER — Encounter: Payer: Self-pay | Admitting: Internal Medicine

## 2016-10-25 ENCOUNTER — Other Ambulatory Visit: Payer: Self-pay | Admitting: Obstetrics and Gynecology

## 2016-10-25 DIAGNOSIS — Z1231 Encounter for screening mammogram for malignant neoplasm of breast: Secondary | ICD-10-CM

## 2016-11-03 ENCOUNTER — Ambulatory Visit
Admission: RE | Admit: 2016-11-03 | Discharge: 2016-11-03 | Disposition: A | Discharge status: Home / Self Care | Payer: Medicare Other | Source: Ambulatory Visit | Attending: Obstetrics and Gynecology | Admitting: Obstetrics and Gynecology

## 2016-11-03 DIAGNOSIS — Z1231 Encounter for screening mammogram for malignant neoplasm of breast: Secondary | ICD-10-CM

## 2016-11-08 DIAGNOSIS — Z23 Encounter for immunization: Secondary | ICD-10-CM | POA: Diagnosis not present

## 2016-11-27 ENCOUNTER — Encounter: Payer: Self-pay | Admitting: Internal Medicine

## 2016-11-27 ENCOUNTER — Ambulatory Visit (INDEPENDENT_AMBULATORY_CARE_PROVIDER_SITE_OTHER): Payer: Medicare Other | Admitting: Internal Medicine

## 2016-11-27 VITALS — BP 128/62 | HR 70 | Temp 98.3°F | Wt 151.6 lb

## 2016-11-27 DIAGNOSIS — D414 Neoplasm of uncertain behavior of bladder: Secondary | ICD-10-CM

## 2016-11-27 DIAGNOSIS — R300 Dysuria: Secondary | ICD-10-CM

## 2016-11-27 LAB — POCT URINALYSIS DIPSTICK
Bilirubin, UA: NEGATIVE
Glucose, UA: NEGATIVE
Ketones, UA: NEGATIVE
Leukocytes, UA: NEGATIVE
NITRITE UA: NEGATIVE
PH UA: 6 (ref 5.0–8.0)
Protein, UA: NEGATIVE
Spec Grav, UA: >=1.03 — AB (ref 1.010–1.025)
UROBILINOGEN UA: 1 U/dL

## 2016-11-27 MED ORDER — NITROFURANTOIN MONOHYD MACRO 100 MG PO CAPS: 100.0000 mg | ORAL_CAPSULE | Freq: Two times a day (BID) | ORAL | 0 refills | Status: AC

## 2016-11-27 MED ORDER — ESOMEPRAZOLE MAGNESIUM 40 MG PO CPDR: DELAYED_RELEASE_CAPSULE | ORAL | 1 refills | Status: DC

## 2016-11-27 NOTE — Patient Instructions (Signed)
ok to treat as  uncomplicated uti .     For now  Please  make appt with urology for  Follow  Up.  For the blood in urine.   If  Ongoing.    Then we will need a culture test.

## 2016-11-27 NOTE — Progress Notes (Signed)
Chief Complaint  Patient presents with  . Urinary Tract Infection    Burning and frequency started about 3 days ago. Pt taking Azo for sx's - has not taken x 1 day. Denies cramping or  lower back pain    HPI: Madison Erickson 76 y.o.  SDA   3 days ago acute dysuria and  Frequency without hematuria.  Feels like a UTI although is getting better today. Hard to get a clean-catch urine because of her lichen sclerosus.  No flank pain or vomiting. She has not been back to the urologist in a while but is due.  She has been known to have chronic hematuria and bladder polyps. ROS: See pertinent positives and negatives per HPI.  Past Medical History:  Diagnosis Date  . Abnormal LFTs    fatty liver on [place] MRI faay liver and hemangioma 2008 biopsy 2011  . Asthma    allergy  . Benign tumor   . Bladder polyps   . Blood transfusion 1969  . Blood transfusion abn reaction or complication, no procedure mishap   . Cancer (HCC)    squameous cell lsft leg hx  . Colon polyp 2009   Tubulovillous adenoma   . Diverticulosis of colon   . Eczema   . Fatty liver disease, nonalcoholic    confirmed biopsy 2011 mild no fibrosis   . GERD (gastroesophageal reflux disease)   . Hematuria    urethral polyps  . HT (hammer toe)   . Hypertension   . Kidney disease    nephritis as  child. polpys, hematuria  . Lichen sclerosus    gyne care topical steroids  . Migraines    hx migraines  . Osteoarthritis   . Shortness of breath    partial lower rt lung lobectomy.  . Status post partial lobectomy of lung    for bronchiesctesis  . Urinary incontinence   . UTI (lower urinary tract infection)     Family History  Problem Relation Age of Onset  . Cancer Mother        lung  . Heart disease Mother   . Diabetes Paternal Grandfather   . Cancer Paternal Grandfather        bladder  . Mental retardation Other     Social History   Socioeconomic History  . Marital status: Married    Spouse name: None  .  Number of children: None  . Years of education: None  . Highest education level: None  Social Needs  . Financial resource strain: None  . Food insecurity - worry: None  . Food insecurity - inability: None  . Transportation needs - medical: None  . Transportation needs - non-medical: None  Occupational History  . None  Tobacco Use  . Smoking status: Never Smoker  . Smokeless tobacco: Never Used  . Tobacco comment: wine q 2-3 weeks  Substance and Sexual Activity  . Alcohol use: Yes    Alcohol/week: 0.0 oz    Comment: 4-5 per year   . Drug use: No  . Sexual activity: None    Comment: hysterectomy  Other Topics Concern  . 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 cetirizine (ZYRTEC) 10 MG tablet Take 10 mg by mouth daily.      [name]  clobetasol (TEMOVATE) 0.05 % cream Apply 1 application topically 2 (two) times daily as needed. For irritation of skin     . hydrochlorothiazide (HYDRODIURIL) 25 MG tablet Take 1 tablet (25 mg total) by mouth daily. 90 tablet 2  . metoprolol succinate (TOPROL XL) 50 MG 24 hr tablet TAKE 1 TABLET DAILY WITH OR IMMEDIATELY FOLLOWING A MEAL 90 tablet 2  . NEXIUM 40 MG capsule TAKE 1 CAPSULE DAILY BEFORE BREAKFAST 90 capsule 1  . rosuvastatin (CRESTOR) 10 MG tablet Take 1 tablet (10 mg total) by mouth daily. 90 tablet 2   No facility-administered medications prior to visit.      EXAM:  BP 128/62 (BP Location: Right Arm, Patient Position: Sitting, Cuff Size: Normal)   Pulse 70   Temp 98.3 F (36.8 C) (Oral)   Wt 151 lb 9.6 oz (68.8 kg)   BMI 27.29 kg/m   Body mass index is 27.29 kg/m.  GENERAL: vitals reviewed and listed above, alert, oriented, appears well hydrated and in no acute distress HEENT: atraumatic, conjunctiva  clear, no obvious  abnormalities on inspection of external nose and ears  Abdomen:  Sof,t normal bowel sounds without hepatosplenomegaly, no guarding rebound or masses no CVA tenderness MS: moves all extremities without noticeable focal  abnormality PSYCH: pleasant and cooperative, no obvious depression or anxiety poct pos blood    QNA for CX  Lab Results  Component Value Date   WBC 10.1 08/15/2016   HGB 14.0 08/15/2016   HCT 41.1 08/15/2016   PLT 254.0 08/15/2016   GLUCOSE 109 (H) 08/15/2016   CHOL 137 08/15/2016   TRIG 135.0 08/15/2016   HDL 34.90 (L) 08/15/2016   LDLDIRECT 201.9 10/26/2008   LDLCALC 75 08/15/2016   ALT 16 08/15/2016   AST 17 08/15/2016   NA 138 08/15/2016   K 3.7 08/15/2016   CL 102 08/15/2016   CREATININE 0.60 08/15/2016   BUN 13 08/15/2016   CO2 29 08/15/2016   TSH 1.11 08/15/2016   INR 0.96 01/29/2009   HGBA1C 5.8 08/15/2016    ASSESSMENT AND PLAN:  Discussed the following assessment and plan:  Dysuria - Plan: POC Urinalysis Dipstick  Bladder polyps Options discussed history of UTIs not sure she can get another good clean catch specimen for culture because of her lichen sclerosis. Discussed empiric and treatment has been on Macrobid before so will treat with this if ongoing come back for clean-catch urine culture.  Also advised follow-up with her urologist for her chronic hematuria. Also asked for refill the Nexium that apparently has fallen off her med list and will need more.  She was is taking it as needed and probably more every other day at this time.  We will plan refill for 6 months. -Patient advised to return or notify health care team  if symptoms worsen ,persist or new concerns arise.  Patient Instructions  ok to treat as  uncomplicated uti .     For now  Please  make appt with urology for  Follow  Up.  For the blood in urine.   If  Ongoing.    Then we will need a culture test.     Standley Brooking. Umeka Wrench M.D.

## 2016-11-27 NOTE — Addendum Note (Signed)
Addended by: Virl Cagey on: 11/27/2016 04:49 PM   Modules accepted: Orders

## 2017-01-19 ENCOUNTER — Encounter (HOSPITAL_BASED_OUTPATIENT_CLINIC_OR_DEPARTMENT_OTHER): Payer: Self-pay | Admitting: *Deleted

## 2017-01-19 ENCOUNTER — Emergency Department (HOSPITAL_BASED_OUTPATIENT_CLINIC_OR_DEPARTMENT_OTHER)
Admission: EM | Admit: 2017-01-19 | Discharge: 2017-01-19 | Disposition: A | Discharge status: Home / Self Care | Payer: Medicare Other | Attending: Emergency Medicine | Admitting: Emergency Medicine

## 2017-01-19 ENCOUNTER — Emergency Department (HOSPITAL_BASED_OUTPATIENT_CLINIC_OR_DEPARTMENT_OTHER): Payer: Medicare Other

## 2017-01-19 ENCOUNTER — Other Ambulatory Visit: Payer: Self-pay

## 2017-01-19 DIAGNOSIS — Z79899 Other long term (current) drug therapy: Secondary | ICD-10-CM | POA: Diagnosis not present

## 2017-01-19 DIAGNOSIS — R509 Fever, unspecified: Secondary | ICD-10-CM

## 2017-01-19 DIAGNOSIS — J45909 Unspecified asthma, uncomplicated: Secondary | ICD-10-CM | POA: Insufficient documentation

## 2017-01-19 DIAGNOSIS — J069 Acute upper respiratory infection, unspecified: Secondary | ICD-10-CM | POA: Diagnosis not present

## 2017-01-19 DIAGNOSIS — R05 Cough: Secondary | ICD-10-CM | POA: Diagnosis not present

## 2017-01-19 DIAGNOSIS — I10 Essential (primary) hypertension: Secondary | ICD-10-CM | POA: Diagnosis not present

## 2017-01-19 LAB — URINALYSIS, ROUTINE W REFLEX MICROSCOPIC
Bilirubin Urine: NEGATIVE
GLUCOSE, UA: NEGATIVE mg/dL
Ketones, ur: NEGATIVE mg/dL
LEUKOCYTES UA: NEGATIVE
NITRITE: NEGATIVE
PH: 7 (ref 5.0–8.0)
Protein, ur: NEGATIVE mg/dL
SPECIFIC GRAVITY, URINE: 1.02 (ref 1.005–1.030)

## 2017-01-19 LAB — URINALYSIS, MICROSCOPIC (REFLEX)

## 2017-01-19 LAB — INFLUENZA PANEL BY PCR (TYPE A & B)
INFLAPCR: NEGATIVE
INFLBPCR: NEGATIVE

## 2017-01-19 MED ORDER — ACETAMINOPHEN 325 MG PO TABS
650.0000 mg | ORAL_TABLET | Freq: Once | ORAL | Status: AC | PRN
  Administered 2017-01-19: 650 mg via ORAL
  Filled 2017-01-19: qty 2

## 2017-01-19 MED ORDER — OSELTAMIVIR PHOSPHATE 75 MG PO CAPS: 75.0000 mg | ORAL_CAPSULE | Freq: Two times a day (BID) | ORAL | 0 refills | Status: AC

## 2017-01-19 NOTE — ED Triage Notes (Signed)
Fever last night. States she thinks she has a UTI. Dysuria.  Cough off and on for a long time.

## 2017-01-19 NOTE — ED Provider Notes (Signed)
[facility] Provider Note   CSN: [id number] Arrival date & time: 01/19/17  1349     History   Chief Complaint Chief Complaint  Patient presents with  . Fever  . Cough    HPI Madison Erickson is a 76 y.o. female.  HPI   Has had cough off and on over the last month, congestion/rhinorrhea Dry cough continuing No sinus symptoms or ear pain Fever started last night, was 100.3 at home Urinary symptoms started a few days ago, started around first of the week, started taking azo, had relief but then symptoms return Headache last night after chills and fevers, just dull ache No other muscle aches Has had fatigue, but reports she is very active and 76, so feels tired after running around    Past Medical History:  Diagnosis Date  . Abnormal LFTs    fatty liver on [place] MRI faay liver and hemangioma 2008 biopsy 2011  . Asthma    allergy  . Benign tumor   . Bladder polyps   . Blood transfusion 1969  . Blood transfusion abn reaction or complication, no procedure mishap   . Cancer (HCC)    squameous cell lsft leg hx  . Colon polyp 2009   Tubulovillous adenoma   . Diverticulosis of colon   . Eczema   . Fatty liver disease, nonalcoholic    confirmed biopsy 2011 mild no fibrosis   . GERD (gastroesophageal reflux disease)   . Hematuria    urethral polyps  . HT (hammer toe)   . Hypertension   . Kidney disease    nephritis as  child. polpys, hematuria  . Lichen sclerosus    gyne care topical steroids  . Migraines    hx migraines  . Osteoarthritis   . Shortness of breath    partial lower rt lung lobectomy.  . Status post partial lobectomy of lung    for bronchiesctesis  . Urinary incontinence   . UTI (lower urinary tract infection)     Patient Active Problem List   Diagnosis Date Noted  . Fasting hyperglycemia 04/21/2014  . Hyperlipidemia 04/21/2014  . Pre-diabetes 08/18/2013  . Osteopenia 04/24/2013  . Medicare annual wellness visit,  subsequent 04/15/2013  . Lichen sclerosus   . Bladder polyps   . Mass of Achilles tendon 02/17/2012  . Fatty liver disease, nonalcoholic   . Knee pain, right 09/06/2010  . Allergic rhinitis, seasonal 04/22/2010  . Status post partial lobectomy of lung   . ASTHMA UNSPECIFIED 01/27/2010  . DIVERTICULITIS-COLON 01/27/2010  . PERSONAL HX COLONIC POLYPS 01/27/2010  . DIVERTICULOSIS, COLON 10/30/2009  . OTHER SPECIFIED DISEASE OF NAIL 03/26/2009  . URINARY TRACT INFECTION, RECURRENT 01/04/2009  . HYPERGLYCEMIA, FASTING 01/04/2009  . LIVER HEMANGIOMA 10/26/2008  . FATTY LIVER DISEASE 10/26/2008  . NONSPECIFIC ABNORMAL RESULTS LIVR FUNCTION STUDY 11/20/2007  . HEMATURIA, MICROSCOPIC, HX OF 11/20/2007  . FATIGUE 10/16/2007  . HYPERLIPIDEMIA 04/29/2007  . Essential hypertension 04/29/2007  . HYPERTENSION NEC 04/29/2007  . OSTEOARTHRITIS 11/19/2006  . URINARY INCONTINENCE 11/19/2006    Past Surgical History:  Procedure Laterality Date  . APPENDECTOMY    . caesarean section     X 3  . KNEE ARTHROSCOPY  12   rt  . left knee surgery on torn mansicus  11  . LUMBAR LAMINECTOMY/DECOMPRESSION MICRODISCECTOMY  03/23/2011   Procedure: LUMBAR LAMINECTOMY/DECOMPRESSION MICRODISCECTOMY 1 LEVEL;  Surgeon: Peggyann Shoals, MD;  Location: [facility];  Service: Neurosurgery;  Laterality: Left;  Left Lumbar four-five laminectomy and microdiscectomy  . PERCUTANEOUS LIVER BIOPSY  2011   fatty liver no fibrosis  . rt knee surgery  2012  . rt lung lobectomy     for bronchiectasis  . VAGINAL HYSTERECTOMY      OB History    No data available       Home Medications    Prior to Admission medications   Medication Sig Start Date End Date Taking? Authorizing Provider  cetirizine (ZYRTEC) 10 MG tablet Take 10 mg by mouth daily.      [provider]  clobetasol (TEMOVATE) 0.05 % cream Apply 1 application topically 2 (two) times daily as needed. For irritation of skin     [provider]  esomeprazole (NEXIUM) 40 MG capsule TAKE 1 CAPSULE DAILY BEFORE BREAKFAST 11/27/16   Panosh, Standley Brooking, MD  hydrochlorothiazide (HYDRODIURIL) 25 MG tablet Take 1 tablet (25 mg total) by mouth daily. 08/22/16   Panosh, Standley Brooking, MD  metoprolol succinate (TOPROL XL) 50 MG 24 hr tablet TAKE 1 TABLET DAILY WITH OR IMMEDIATELY FOLLOWING A MEAL 08/22/16   Panosh, Standley Brooking, MD  oseltamivir (TAMIFLU) 75 MG capsule Take 1 capsule (75 mg total) by mouth every 12 (twelve) hours for 5 days. 01/19/17 01/24/17  Gareth Morgan, MD  rosuvastatin (CRESTOR) 10 MG tablet Take 1 tablet (10 mg total) by mouth daily. 08/22/16   Panosh, Standley Brooking, MD    Family History Family History  Problem Relation Age of Onset  . Cancer Mother        lung  . Heart disease Mother   . Diabetes Paternal Grandfather   . Cancer Paternal Grandfather        bladder  . Mental retardation Other     Social History Social History   Tobacco Use  . Smoking status: Never Smoker  . Smokeless tobacco: Never Used  . Tobacco comment: wine q 2-3 weeks  Substance Use Topics  . Alcohol use: Yes    Alcohol/week: 0.0 oz    Comment: 4-5 per year   . Drug use: No     Allergies   Atorvastatin and Sulfamethoxazole   Review of Systems Review of Systems  Constitutional: Positive for fever.  HENT: Positive for congestion and rhinorrhea. Negative for sore throat.   Eyes: Negative for visual disturbance.  Respiratory: Positive for cough. Negative for shortness of breath.   Cardiovascular: Negative for chest pain.  Gastrointestinal: Negative for abdominal pain, nausea and vomiting.  Genitourinary: Negative for difficulty urinating.  Musculoskeletal: Negative for back pain and neck pain.  Skin: Negative for rash.  Neurological: Negative for syncope and headaches.     Physical Exam Updated Vital Signs BP (!) 157/82   Pulse 80   Temp (!) 100.7 F (38.2 C) (Oral)   Resp 20   Ht 5\' 3"  (1.6 m)   Wt 67.1 kg (148 lb)   SpO2  99%   BMI 26.22 kg/m   Physical Exam  Constitutional: She is oriented to person, place, and time. She appears well-developed and well-nourished. No distress.  HENT:  Head: Normocephalic and atraumatic.  Eyes: Conjunctivae and EOM are normal.  Neck: Normal range of motion.  Cardiovascular: Normal rate, regular rhythm, normal heart sounds and intact distal pulses. Exam reveals no gallop and no friction rub.  No murmur heard. Pulmonary/Chest: Effort normal and breath sounds normal. No respiratory distress. She has no wheezes. She has no rales.  Abdominal: Soft. She exhibits no distension. There is  no tenderness. There is no guarding.  Musculoskeletal: She exhibits no edema or tenderness.  Neurological: She is alert and oriented to person, place, and time.  Skin: Skin is warm and dry. No rash noted. She is not diaphoretic. No erythema.  Nursing note and vitals reviewed.    ED Treatments / Results  Labs (all labs ordered are listed, but only abnormal results are displayed) Labs Reviewed  URINALYSIS, ROUTINE W REFLEX MICROSCOPIC - Abnormal; Notable for the following components:      Result Value   APPearance CLOUDY (*)    Hgb urine dipstick LARGE (*)    All other components within normal limits  URINALYSIS, MICROSCOPIC (REFLEX) - Abnormal; Notable for the following components:   Bacteria, UA FEW (*)    Squamous Epithelial / LPF 0-5 (*)    All other components within normal limits  INFLUENZA PANEL BY PCR (TYPE A & B)    EKG  EKG Interpretation None       Radiology Dg Chest 2 View  Result Date: 01/19/2017 CLINICAL DATA:  76 year old female with fever onset last night. Intermittent cough. EXAM: CHEST  2 VIEW COMPARISON:  05/23/2016 and earlier. FINDINGS: Stable lung volumes and chronic blunting of the right lateral costophrenic angle. Stable cardiac size at the upper limits of normal and mild tortuosity of the thoracic aorta. Other mediastinal contours are within normal limits.  Visualized tracheal air column is within normal limits. No pneumothorax, pulmonary edema, pleural effusion or acute pulmonary opacity. Some attenuation of upper lobe bronchovascular markings raises the possibility of emphysema. No acute osseous abnormality identified. Negative visible bowel gas pattern. IMPRESSION: No acute cardiopulmonary abnormality. Electronically Signed   By: Genevie Ann M.D.   On: 01/19/2017 15:19    Procedures Procedures (including critical care time)  Medications Ordered in ED Medications  acetaminophen (TYLENOL) tablet 650 mg (650 mg Oral Given 01/19/17 1621)     Initial Impression / Assessment and Plan / ED Course  I have reviewed the triage vital signs and the nursing notes.  Pertinent labs & imaging results that were available during my care of the patient were reviewed by me and considered in my medical decision making (see chart for details).    76 year old female with history above, presents with concern for fever, cough and urinary symptoms.  Urinalysis shows hematuria without signs of white blood cells or infection.  Patient with history of bladder polyps, which is likely etiology of the hematuria.  Chest x-ray shows no sign of pneumonia.  Influenza testing was done which is negative.  Suspect other likely viral etiology of patient's fever.  Other than her elevated temperature, her vital signs are stable, she is well-appearing.  Recommend continued supportive care, PCP follow up.  Final Clinical Impressions(s) / ED Diagnoses   Final diagnoses:  Upper respiratory tract infection, unspecified type  Fever, unspecified fever cause    ED Discharge Orders        Ordered    oseltamivir (TAMIFLU) 75 MG capsule  Every 12 hours     01/19/17 1700       Gareth Morgan, MD 01/20/17 0205

## 2017-01-19 NOTE — ED Notes (Signed)
NAD at this time. Pt is stable and going home.  

## 2017-02-05 NOTE — Progress Notes (Signed)
Chief Complaint  Patient presents with  . Cough    Cough x 1 month with congestion and clear mucus. Fever in the beginning(100.7) seen by [facility]. Not sleeping well.     HPI: Madison Erickson 77 y.o. come in for   sda but ongoing problem   Onset 12 26 or therabouts   Flu negative .   End of December was seen in the med center ED for fever body aches and cough.  Her chest x-ray was negative and rapid flu was negative so she was not taking medicine for flu.  Since that time her fever has resolved however she has extremet fatigue .   bbreathing is ok now .  But still has a lot of mucus in her had postnasal drainage coughing up clear phlegm does not think it is deep in her lungs where she usually has a problem on the right.  But because of the persistent symptoms her husband was concerned and she comes in for recheck. And coughing  Still an issues.  Runny nose.    Coughing out clear   Was green at initially  corididin hbp.    Temporarily.  Has stopped it at this time.  See above  Has hx of  Lung surgery   uirne sx are better .  As she had some urinary tract symptoms in the emergency room but urinalysis was clear. No face pain or specific ear pain.   .  ROS: See pertinent positives and negatives per HPI.  Past Medical History:  Diagnosis Date  . Abnormal LFTs    fatty liver on [place] MRI faay liver and hemangioma 2008 biopsy 2011  . Asthma    allergy  . Benign tumor   . Bladder polyps   . Blood transfusion 1969  . Blood transfusion abn reaction or complication, no procedure mishap   . Cancer (HCC)    squameous cell lsft leg hx  . Colon polyp 2009   Tubulovillous adenoma   . Diverticulosis of colon   . Eczema   . Fatty liver disease, nonalcoholic    confirmed biopsy 2011 mild no fibrosis   . GERD (gastroesophageal reflux disease)   . Hematuria    urethral polyps  . HT (hammer toe)   . Hypertension   . Kidney disease    nephritis as  child. polpys, hematuria  . Lichen  sclerosus    gyne care topical steroids  . Migraines    hx migraines  . Osteoarthritis   . Shortness of breath    partial lower rt lung lobectomy.  . Status post partial lobectomy of lung    for bronchiesctesis  . Urinary incontinence   . UTI (lower urinary tract infection)     Family History  Problem Relation Age of Onset  . Cancer Mother        lung  . Heart disease Mother   . Diabetes Paternal Grandfather   . Cancer Paternal Grandfather        bladder  . Mental retardation Other     Social History   Socioeconomic History  . Marital status: Married    Spouse name: None  . Number of children: None  . Years of education: None  . Highest education level: None  Social Needs  . Financial resource strain: None  . Food insecurity - worry: None  . Food insecurity - inability: None  . Transportation needs - medical: None  . Transportation needs - non-medical: None  Occupational  History  . None  Tobacco Use  . Smoking status: Never Smoker  . Smokeless tobacco: Never Used  . Tobacco comment: wine q 2-3 weeks  Substance and Sexual Activity  . Alcohol use: Yes    Alcohol/week: 0.0 oz    Comment: 4-5 per year   . Drug use: No  . Sexual activity: None    Comment: hysterectomy  Other Topics Concern  . 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 cetirizine (ZYRTEC) 10 MG tablet Take 10 mg by mouth daily.      . clobetasol (TEMOVATE) 0.05 % cream Apply 1 application topically 2 (two) times daily as needed. For irritation of skin     . hydrochlorothiazide (HYDRODIURIL) 25 MG tablet Take 1 tablet (25 mg total) by mouth daily. 90 tablet 2  . metoprolol succinate (TOPROL XL) 50 MG 24 hr tablet TAKE 1 TABLET DAILY WITH OR IMMEDIATELY FOLLOWING A MEAL 90 tablet 2  .  rosuvastatin (CRESTOR) 10 MG tablet Take 1 tablet (10 mg total) by mouth daily. 90 tablet 2  . esomeprazole (NEXIUM) 40 MG capsule TAKE 1 CAPSULE DAILY BEFORE BREAKFAST 90 capsule 1   No facility-administered medications prior to visit.      EXAM:  BP 128/64 (BP Location: Right Arm, Patient Position: Sitting, Cuff Size: Normal)   Pulse 73   Temp 98.5 F (36.9 C) (Oral)   Wt 152 lb 3.2 oz (69 kg)   SpO2 96%   BMI 26.96 kg/m   Body mass index is 26.96 kg/m.  GENERAL: vitals reviewed and listed above, alert, oriented, appears well hydrated and in no acute distress she is nontoxic no respiratory distress some mild upper respiratory congestion and throat clearing. HEENT: atraumatic, conjunctiva  clear, no obvious abnormalities on inspection of external nose and ears TMs are intact with no acute findings face is nontender nose is stuffy OP : no lesion edema or exudate  NECK: no obvious masses on inspection palpation  LUNGS: clear to auscultation bilaterally, no wheezes, rales or rhonchi,CV: HRRR, no clubbing cyanosis or  peripheral edema nl cap refill  MS: moves all extremities without noticeable focal  abnormality PSYCH: pleasant and cooperative, no obvious depression or anxiety Lab Results  Component Value Date   WBC 8.7 02/07/2017   HGB 14.0 02/07/2017   HCT 41.8 02/07/2017   PLT 239.0 02/07/2017   GLUCOSE 122 (H) 02/07/2017   CHOL 137 08/15/2016   TRIG 135.0 08/15/2016   HDL 34.90 (L) 08/15/2016   LDLDIRECT 201.9 10/26/2008   LDLCALC 75 08/15/2016   ALT 14 02/07/2017   AST 17 02/07/2017   NA 139 02/07/2017   K 3.7 02/07/2017   CL 103 02/07/2017   CREATININE 0.59 02/07/2017   BUN 13 02/07/2017   CO2 32 02/07/2017   TSH 1.11 08/15/2016   INR 0.96 01/29/2009   HGBA1C 6.1 02/07/2017   BP Readings from Last 3 Encounters:  02/07/17 128/64  01/19/17 (!) 157/82  11/27/16 128/62    ASSESSMENT AND PLAN:  Discussed the following assessment and plan:  Other fatigue -  Plan: Hemoglobin A1c, CBC with Differential/Platelet, CMP  Persistent cough - Plan: Hemoglobin A1c, CBC with Differential/Platelet, CMP  Fasting hyperglycemia - Plan: Hemoglobin A1c, CBC with Differential/Platelet, CMP  Status post partial lobectomy of lung At this point her exam is reassuring and no evidence of bacterial infection but does have significant fatigue so blood work was ordered today.  Would also advise getting back on her Nexium to be sent in in case her reflux is adding to her cough most nasal drainage symptoms. We discussed expectant management relapsing symptoms fever shortness of breath low threshold for recheck or reevaluation.  At this time she has no signs of flareup of her history of bronchiectasis and lung surgery. This whole illness acts like a low-grade flu recovering or para flu that is in the community. -Patient advised to return or notify health care team  if  new concerns arise. We discussed the option of using an inhaler but in the past it made her feel too nervous and uncertain if this would help her postinfectious cough at this time anyway.Total visit 80mins > 50% spent counseling and coordinating care as indicated in above note and in instructions to patient .     Patient Instructions  This still acts like  a  Flu like illness   Possible still   Your exam is reassuring.   Today and I dont see bacterial infection but you are at risk for this .  I agree  With stopping   Some of the meds .    Can make you tired .   We can get  . Blood   Work to day   To check blood sugar anemia etc.  Stop the cough drops and use sugar free candy.   Get back on the nexium .   If fever  Lower chest pain signs of  Infected  Mucous  Let us know.       Standley Brooking. Panosh M.D.

## 2017-02-07 ENCOUNTER — Encounter: Payer: Self-pay | Admitting: Internal Medicine

## 2017-02-07 ENCOUNTER — Ambulatory Visit (INDEPENDENT_AMBULATORY_CARE_PROVIDER_SITE_OTHER): Payer: Medicare Other | Admitting: Internal Medicine

## 2017-02-07 VITALS — BP 128/64 | HR 73 | Temp 98.5°F | Wt 152.2 lb

## 2017-02-07 DIAGNOSIS — R7301 Impaired fasting glucose: Secondary | ICD-10-CM

## 2017-02-07 DIAGNOSIS — R05 Cough: Secondary | ICD-10-CM | POA: Diagnosis not present

## 2017-02-07 DIAGNOSIS — R5383 Other fatigue: Secondary | ICD-10-CM | POA: Diagnosis not present

## 2017-02-07 DIAGNOSIS — Z902 Acquired absence of lung [part of]: Secondary | ICD-10-CM | POA: Diagnosis not present

## 2017-02-07 DIAGNOSIS — R053 Chronic cough: Secondary | ICD-10-CM

## 2017-02-07 LAB — CBC WITH DIFFERENTIAL/PLATELET
BASOS PCT: 1.5 % (ref 0.0–3.0)
Basophils Absolute: 0.1 10*3/uL (ref 0.0–0.1)
EOS ABS: 0.3 10*3/uL (ref 0.0–0.7)
Eosinophils Relative: 3.5 % (ref 0.0–5.0)
HCT: 41.8 % (ref 36.0–46.0)
Hemoglobin: 14 g/dL (ref 12.0–15.0)
Lymphocytes Relative: 31.4 % (ref 12.0–46.0)
Lymphs Abs: 2.7 10*3/uL (ref 0.7–4.0)
MCHC: 33.5 g/dL (ref 30.0–36.0)
MCV: 87.9 fl (ref 78.0–100.0)
Monocytes Absolute: 0.7 10*3/uL (ref 0.1–1.0)
Monocytes Relative: 8.4 % (ref 3.0–12.0)
NEUTROS ABS: 4.8 10*3/uL (ref 1.4–7.7)
Neutrophils Relative %: 55.2 % (ref 43.0–77.0)
PLATELETS: 239 10*3/uL (ref 150.0–400.0)
RBC: 4.75 Mil/uL (ref 3.87–5.11)
RDW: 12.2 % (ref 11.5–15.5)
WBC: 8.7 10*3/uL (ref 4.0–10.5)

## 2017-02-07 LAB — COMPREHENSIVE METABOLIC PANEL
ALK PHOS: 44 U/L (ref 39–117)
ALT: 14 U/L (ref 0–35)
AST: 17 U/L (ref 0–37)
Albumin: 4.3 g/dL (ref 3.5–5.2)
BILIRUBIN TOTAL: 0.5 mg/dL (ref 0.2–1.2)
BUN: 13 mg/dL (ref 6–23)
CHLORIDE: 103 meq/L (ref 96–112)
CO2: 32 meq/L (ref 19–32)
Calcium: 9.7 mg/dL (ref 8.4–10.5)
Creatinine, Ser: 0.59 mg/dL (ref 0.40–1.20)
GFR: 105.25 mL/min (ref >60.00)
Glucose, Bld: 122 mg/dL — ABNORMAL HIGH (ref 70–99)
Potassium: 3.7 mEq/L (ref 3.5–5.1)
SODIUM: 139 meq/L (ref 135–145)
Total Protein: 6.5 g/dL (ref 6.0–8.3)

## 2017-02-07 LAB — HEMOGLOBIN A1C: Hgb A1c MFr Bld: 6.1 % (ref 4.6–6.5)

## 2017-02-07 MED ORDER — ESOMEPRAZOLE MAGNESIUM 40 MG PO CPDR: DELAYED_RELEASE_CAPSULE | ORAL | 1 refills | Status: DC

## 2017-02-07 NOTE — Patient Instructions (Addendum)
This still acts like  a  Flu like illness   Possible still   Your exam is reassuring.   Today and I dont see bacterial infection but you are at risk for this .  I agree  With stopping   Some of the meds .    Can make you tired .   We can get  . Blood   Work to day   To check blood sugar anemia etc.  Stop the cough drops and use sugar free candy.   Get back on the nexium .   If fever  Lower chest pain signs of  Infected  Mucous  Let us know.

## 2017-02-10 ENCOUNTER — Encounter: Payer: Self-pay | Admitting: Family Medicine

## 2017-02-10 ENCOUNTER — Ambulatory Visit (INDEPENDENT_AMBULATORY_CARE_PROVIDER_SITE_OTHER): Payer: Medicare Other | Admitting: Family Medicine

## 2017-02-10 VITALS — BP 142/86 | HR 86 | Temp 98.1°F | Ht 62.5 in | Wt 152.0 lb

## 2017-02-10 DIAGNOSIS — N3091 Cystitis, unspecified with hematuria: Secondary | ICD-10-CM

## 2017-02-10 DIAGNOSIS — R300 Dysuria: Secondary | ICD-10-CM | POA: Diagnosis not present

## 2017-02-10 LAB — POCT URINALYSIS DIPSTICK
BILIRUBIN UA: NEGATIVE
GLUCOSE UA: NEGATIVE
KETONES UA: NEGATIVE
Nitrite, UA: NEGATIVE
PH UA: 6.5 (ref 5.0–8.0)
Spec Grav, UA: 1.015 (ref 1.010–1.025)
UROBILINOGEN UA: 1 U/dL

## 2017-02-10 MED ORDER — NITROFURANTOIN MONOHYD MACRO 100 MG PO CAPS: 100.0000 mg | ORAL_CAPSULE | Freq: Two times a day (BID) | ORAL | 0 refills | Status: DC

## 2017-02-10 NOTE — Progress Notes (Signed)
OFFICE VISIT  02/10/2017   CC: No chief complaint on file. UTI?  HPI:    Patient is a 77 y.o. Caucasian female who presents for suspicion of UTI. Acute onset this morning, urinary urgency, dysuria, saw a little blood on toilet tissue when wiping. No nausea or fevers or flank pain/back pain.  She took AZO 2 tabs this morning. Says her usual UTI starts abruptly like this.  Long hx of bladder polyps. Has Lichen sclerosis of vulva, gets irritated sometimes and usually a UTI follows this.  Past Medical History:  Diagnosis Date  . Abnormal LFTs    fatty liver on [place] MRI faay liver and hemangioma 2008 biopsy 2011  . Asthma    allergy  . Benign tumor   . Bladder polyps   . Blood transfusion 1969  . Blood transfusion abn reaction or complication, no procedure mishap   . Cancer (HCC)    squameous cell lsft leg hx  . Colon polyp 2009   Tubulovillous adenoma   . Diverticulosis of colon   . Eczema   . Fatty liver disease, nonalcoholic    confirmed biopsy 2011 mild no fibrosis   . GERD (gastroesophageal reflux disease)   . Hematuria    urethral polyps  . HT (hammer toe)   . Hypertension   . Kidney disease    nephritis as  child. polpys, hematuria  . Lichen sclerosus    gyne care topical steroids  . Migraines    hx migraines  . Osteoarthritis   . Shortness of breath    partial lower rt lung lobectomy.  . Status post partial lobectomy of lung    for bronchiesctesis  . Urinary incontinence   . UTI (lower urinary tract infection)     Past Surgical History:  Procedure Laterality Date  . APPENDECTOMY    . caesarean section     X 3  . KNEE ARTHROSCOPY  12   rt  . left knee surgery on torn mansicus  11  . LUMBAR LAMINECTOMY/DECOMPRESSION MICRODISCECTOMY  03/23/2011   Procedure: LUMBAR LAMINECTOMY/DECOMPRESSION MICRODISCECTOMY 1 LEVEL;  Surgeon: Peggyann Shoals, MD;  Location: [facility];  Service: Neurosurgery;  Laterality: Left;  Left Lumbar four-five laminectomy and  microdiscectomy  . PERCUTANEOUS LIVER BIOPSY  2011   fatty liver no fibrosis  . rt knee surgery  2012  . rt lung lobectomy     for bronchiectasis  . VAGINAL HYSTERECTOMY      Outpatient Medications Prior to Visit  Medication Sig Dispense Refill  . cetirizine (ZYRTEC) 10 MG tablet Take 10 mg by mouth daily.      . clobetasol (TEMOVATE) 0.05 % cream Apply 1 application topically 2 (two) times daily as needed. For irritation of skin     . esomeprazole (NEXIUM) 40 MG capsule TAKE 1 CAPSULE DAILY BEFORE BREAKFAST 90 capsule 1  . hydrochlorothiazide (HYDRODIURIL) 25 MG tablet Take 1 tablet (25 mg total) by mouth daily. 90 tablet 2  . metoprolol succinate (TOPROL XL) 50 MG 24 hr tablet TAKE 1 TABLET DAILY WITH OR IMMEDIATELY FOLLOWING A MEAL 90 tablet 2  . rosuvastatin (CRESTOR) 10 MG tablet Take 1 tablet (10 mg total) by mouth daily. 90 tablet 2   No facility-administered medications prior to visit.     Allergies  Allergen Reactions  . Atorvastatin     REACTION: muscle pain  . Sulfamethoxazole     REACTION: unspecified    ROS As per HPI  PE: Blood pressure (!) 142/86,  pulse 86, temperature 98.1 F (36.7 C), temperature source Oral, height 5' 2.5" (1.588 m), weight 152 lb (68.9 kg), SpO2 96 %. Gen: Alert, well appearing.  Patient is oriented to person, place, time, and situation. AFFECT: pleasant, lucid thought and speech. No further exam today.  LABS:    Chemistry      Component Value Date/Time   NA 139 02/07/2017 1051   K 3.7 02/07/2017 1051   CL 103 02/07/2017 1051   CO2 32 02/07/2017 1051   BUN 13 02/07/2017 1051   CREATININE 0.59 02/07/2017 1051      Component Value Date/Time   CALCIUM 9.7 02/07/2017 1051   ALKPHOS 44 02/07/2017 1051   AST 17 02/07/2017 1051   ALT 14 02/07/2017 1051   BILITOT 0.5 02/07/2017 1051     CC UA today: 3+ blood, 3+ LEU, 1+ prot, otherwise normal.  IMPRESSION AND PLAN:  Acute cystitis with hematuria: UA compatible with  UTI--doesn't appear that the AZO she took has affected urine as of yet. Not enough urine produced to sent for c/s. Pt has taken cipro in the past but seems to recall that it made her sick the last time it was rx'd for her while at [facility] has an MD there as well. I decided to rx macrobid 100 mg bid x 5d.  An After Visit Summary was printed and given to the patient.  FOLLOW UP: Return if symptoms worsen or fail to improve.  Signed:  Crissie Sickles, MD           02/10/2017

## 2017-03-14 DIAGNOSIS — R509 Fever, unspecified: Secondary | ICD-10-CM | POA: Diagnosis not present

## 2017-03-14 DIAGNOSIS — J101 Influenza due to other identified influenza virus with other respiratory manifestations: Secondary | ICD-10-CM | POA: Diagnosis not present

## 2017-05-09 ENCOUNTER — Other Ambulatory Visit: Payer: Self-pay | Admitting: Internal Medicine

## 2017-05-19 ENCOUNTER — Other Ambulatory Visit: Payer: Self-pay | Admitting: Internal Medicine

## 2017-07-17 DIAGNOSIS — Z85828 Personal history of other malignant neoplasm of skin: Secondary | ICD-10-CM | POA: Diagnosis not present

## 2017-07-17 DIAGNOSIS — L570 Actinic keratosis: Secondary | ICD-10-CM | POA: Diagnosis not present

## 2017-07-17 DIAGNOSIS — L821 Other seborrheic keratosis: Secondary | ICD-10-CM | POA: Diagnosis not present

## 2017-07-17 DIAGNOSIS — D3613 Benign neoplasm of peripheral nerves and autonomic nervous system of lower limb, including hip: Secondary | ICD-10-CM | POA: Diagnosis not present

## 2017-07-17 DIAGNOSIS — D225 Melanocytic nevi of trunk: Secondary | ICD-10-CM | POA: Diagnosis not present

## 2017-07-17 DIAGNOSIS — L738 Other specified follicular disorders: Secondary | ICD-10-CM | POA: Diagnosis not present

## 2017-08-17 ENCOUNTER — Other Ambulatory Visit: Payer: Self-pay | Admitting: Internal Medicine

## 2017-08-21 DIAGNOSIS — L900 Lichen sclerosus et atrophicus: Secondary | ICD-10-CM | POA: Diagnosis not present

## 2017-08-21 DIAGNOSIS — Z01419 Encounter for gynecological examination (general) (routine) without abnormal findings: Secondary | ICD-10-CM | POA: Diagnosis not present

## 2017-08-21 DIAGNOSIS — Z6827 Body mass index (BMI) 27.0-27.9, adult: Secondary | ICD-10-CM | POA: Diagnosis not present

## 2017-08-21 DIAGNOSIS — R350 Frequency of micturition: Secondary | ICD-10-CM | POA: Diagnosis not present

## 2017-08-21 DIAGNOSIS — Z124 Encounter for screening for malignant neoplasm of cervix: Secondary | ICD-10-CM | POA: Diagnosis not present

## 2017-10-12 ENCOUNTER — Emergency Department (HOSPITAL_BASED_OUTPATIENT_CLINIC_OR_DEPARTMENT_OTHER): Payer: Medicare Other

## 2017-10-12 ENCOUNTER — Other Ambulatory Visit: Payer: Self-pay

## 2017-10-12 ENCOUNTER — Encounter (HOSPITAL_BASED_OUTPATIENT_CLINIC_OR_DEPARTMENT_OTHER): Payer: Self-pay | Admitting: *Deleted

## 2017-10-12 ENCOUNTER — Observation Stay (HOSPITAL_BASED_OUTPATIENT_CLINIC_OR_DEPARTMENT_OTHER)
Admission: EM | Admit: 2017-10-12 | Discharge: 2017-10-13 | Disposition: A | Discharge status: Home / Self Care | Payer: Medicare Other | Attending: Internal Medicine | Admitting: Internal Medicine

## 2017-10-12 DIAGNOSIS — K219 Gastro-esophageal reflux disease without esophagitis: Secondary | ICD-10-CM | POA: Diagnosis not present

## 2017-10-12 DIAGNOSIS — J4520 Mild intermittent asthma, uncomplicated: Secondary | ICD-10-CM

## 2017-10-12 DIAGNOSIS — Z86010 Personal history of colonic polyps: Secondary | ICD-10-CM | POA: Diagnosis not present

## 2017-10-12 DIAGNOSIS — Z79899 Other long term (current) drug therapy: Secondary | ICD-10-CM | POA: Insufficient documentation

## 2017-10-12 DIAGNOSIS — M8588 Other specified disorders of bone density and structure, other site: Secondary | ICD-10-CM | POA: Insufficient documentation

## 2017-10-12 DIAGNOSIS — R410 Disorientation, unspecified: Secondary | ICD-10-CM | POA: Diagnosis not present

## 2017-10-12 DIAGNOSIS — G43909 Migraine, unspecified, not intractable, without status migrainosus: Secondary | ICD-10-CM | POA: Diagnosis not present

## 2017-10-12 DIAGNOSIS — I639 Cerebral infarction, unspecified: Secondary | ICD-10-CM | POA: Diagnosis not present

## 2017-10-12 DIAGNOSIS — Z902 Acquired absence of lung [part of]: Secondary | ICD-10-CM | POA: Diagnosis not present

## 2017-10-12 DIAGNOSIS — M1990 Unspecified osteoarthritis, unspecified site: Secondary | ICD-10-CM | POA: Insufficient documentation

## 2017-10-12 DIAGNOSIS — K760 Fatty (change of) liver, not elsewhere classified: Secondary | ICD-10-CM | POA: Insufficient documentation

## 2017-10-12 DIAGNOSIS — Z8249 Family history of ischemic heart disease and other diseases of the circulatory system: Secondary | ICD-10-CM | POA: Insufficient documentation

## 2017-10-12 DIAGNOSIS — Z882 Allergy status to sulfonamides status: Secondary | ICD-10-CM | POA: Diagnosis not present

## 2017-10-12 DIAGNOSIS — J45909 Unspecified asthma, uncomplicated: Secondary | ICD-10-CM | POA: Diagnosis present

## 2017-10-12 DIAGNOSIS — Z888 Allergy status to other drugs, medicaments and biological substances status: Secondary | ICD-10-CM | POA: Diagnosis not present

## 2017-10-12 DIAGNOSIS — E785 Hyperlipidemia, unspecified: Secondary | ICD-10-CM | POA: Diagnosis not present

## 2017-10-12 DIAGNOSIS — I10 Essential (primary) hypertension: Secondary | ICD-10-CM | POA: Insufficient documentation

## 2017-10-12 DIAGNOSIS — R2990 Unspecified symptoms and signs involving the nervous system: Secondary | ICD-10-CM

## 2017-10-12 DIAGNOSIS — R7303 Prediabetes: Secondary | ICD-10-CM | POA: Insufficient documentation

## 2017-10-12 DIAGNOSIS — G459 Transient cerebral ischemic attack, unspecified: Principal | ICD-10-CM | POA: Insufficient documentation

## 2017-10-12 DIAGNOSIS — R4789 Other speech disturbances: Secondary | ICD-10-CM | POA: Diagnosis not present

## 2017-10-12 DIAGNOSIS — R200 Anesthesia of skin: Secondary | ICD-10-CM | POA: Diagnosis not present

## 2017-10-12 LAB — URINALYSIS, MICROSCOPIC (REFLEX): WBC UA: NONE SEEN WBC/hpf (ref 0–5)

## 2017-10-12 LAB — URINALYSIS, ROUTINE W REFLEX MICROSCOPIC
Bilirubin Urine: NEGATIVE
GLUCOSE, UA: NEGATIVE mg/dL
Ketones, ur: NEGATIVE mg/dL
LEUKOCYTES UA: NEGATIVE
Nitrite: NEGATIVE
PH: 7 (ref 5.0–8.0)
PROTEIN: NEGATIVE mg/dL
Specific Gravity, Urine: 1.01 (ref 1.005–1.030)

## 2017-10-12 LAB — CBC
HEMATOCRIT: 42.4 % (ref 36.0–46.0)
Hemoglobin: 14.7 g/dL (ref 12.0–15.0)
MCH: 29.8 pg (ref 26.0–34.0)
MCHC: 34.7 g/dL (ref 30.0–36.0)
MCV: 86 fL (ref 78.0–100.0)
Platelets: 229 10*3/uL (ref 150–400)
RBC: 4.93 MIL/uL (ref 3.87–5.11)
RDW: 11.8 % (ref 11.5–15.5)
WBC: 9.5 10*3/uL (ref 4.0–10.5)

## 2017-10-12 LAB — RAPID URINE DRUG SCREEN, HOSP PERFORMED
Amphetamines: NOT DETECTED
BARBITURATES: NOT DETECTED
Benzodiazepines: NOT DETECTED
COCAINE: NOT DETECTED
OPIATES: NOT DETECTED
Tetrahydrocannabinol: NOT DETECTED

## 2017-10-12 LAB — DIFFERENTIAL
BASOS ABS: 0.1 10*3/uL (ref 0.0–0.1)
BASOS PCT: 1 %
EOS ABS: 0.3 10*3/uL (ref 0.0–0.7)
Eosinophils Relative: 3 %
Lymphocytes Relative: 33 %
Lymphs Abs: 3.1 10*3/uL (ref 0.7–4.0)
MONO ABS: 0.8 10*3/uL (ref 0.1–1.0)
MONOS PCT: 8 %
Neutro Abs: 5.3 10*3/uL (ref 1.7–7.7)
Neutrophils Relative %: 55 %

## 2017-10-12 LAB — COMPREHENSIVE METABOLIC PANEL
ALT: 21 U/L (ref 0–44)
ANION GAP: 8 (ref 5–15)
AST: 23 U/L (ref 15–41)
Albumin: 4.4 g/dL (ref 3.5–5.0)
Alkaline Phosphatase: 46 U/L (ref 38–126)
BUN: 15 mg/dL (ref 8–23)
CHLORIDE: 104 mmol/L (ref 98–111)
CO2: 27 mmol/L (ref 22–32)
Calcium: 9.7 mg/dL (ref 8.9–10.3)
Creatinine, Ser: 0.63 mg/dL (ref 0.44–1.00)
Glucose, Bld: 100 mg/dL — ABNORMAL HIGH (ref 70–99)
Potassium: 3.6 mmol/L (ref 3.5–5.1)
Sodium: 139 mmol/L (ref 135–145)
Total Bilirubin: 0.6 mg/dL (ref 0.3–1.2)
Total Protein: 7 g/dL (ref 6.5–8.1)

## 2017-10-12 LAB — APTT: APTT: 29 s (ref 24–36)

## 2017-10-12 LAB — CBG MONITORING, ED: GLUCOSE-CAPILLARY: 94 mg/dL (ref 70–99)

## 2017-10-12 LAB — PROTIME-INR
INR: 1.02
Prothrombin Time: 13.3 seconds (ref 11.4–15.2)

## 2017-10-12 LAB — ETHANOL: Alcohol, Ethyl (B): <10 mg/dL (ref <10)

## 2017-10-12 LAB — TROPONIN I

## 2017-10-12 MED ORDER — STROKE: EARLY STAGES OF RECOVERY BOOK
Freq: Once | Status: AC
  Administered 2017-10-13
  Filled 2017-10-12: qty 1

## 2017-10-12 MED ORDER — SENNOSIDES-DOCUSATE SODIUM 8.6-50 MG PO TABS: 1.0000 | ORAL_TABLET | Freq: Every evening | ORAL | Status: DC | PRN

## 2017-10-12 MED ORDER — ACETAMINOPHEN 325 MG PO TABS: 650.0000 mg | ORAL_TABLET | ORAL | Status: DC | PRN

## 2017-10-12 MED ORDER — ASPIRIN 300 MG RE SUPP: 300.0000 mg | Freq: Every day | RECTAL | Status: DC

## 2017-10-12 MED ORDER — CLOBETASOL PROPIONATE 0.05 % EX CREA
1.0000 "application " | TOPICAL_CREAM | Freq: Two times a day (BID) | CUTANEOUS | Status: DC
  Filled 2017-10-12: qty 15

## 2017-10-12 MED ORDER — METRONIDAZOLE 0.75 % EX LOTN: 1.0000 "application " | TOPICAL_LOTION | CUTANEOUS | Status: DC

## 2017-10-12 MED ORDER — HYDRALAZINE HCL 20 MG/ML IJ SOLN: 5.0000 mg | INTRAMUSCULAR | Status: DC | PRN

## 2017-10-12 MED ORDER — METOPROLOL SUCCINATE ER 50 MG PO TB24
50.0000 mg | ORAL_TABLET | Freq: Every day | ORAL | Status: DC
  Filled 2017-10-12: qty 1

## 2017-10-12 MED ORDER — ACETAMINOPHEN 160 MG/5ML PO SOLN: 650.0000 mg | ORAL | Status: DC | PRN

## 2017-10-12 MED ORDER — ASPIRIN 325 MG PO TABS
325.0000 mg | ORAL_TABLET | Freq: Every day | ORAL | Status: DC
  Administered 2017-10-13: 325 mg via ORAL
  Filled 2017-10-12: qty 1

## 2017-10-12 MED ORDER — LORATADINE 10 MG PO TABS
10.0000 mg | ORAL_TABLET | Freq: Every day | ORAL | Status: DC
  Filled 2017-10-12: qty 1

## 2017-10-12 MED ORDER — ENOXAPARIN SODIUM 40 MG/0.4ML ~~LOC~~ SOLN: 40.0000 mg | SUBCUTANEOUS | Status: DC

## 2017-10-12 MED ORDER — ZOLPIDEM TARTRATE 5 MG PO TABS: 5.0000 mg | ORAL_TABLET | Freq: Every evening | ORAL | Status: DC | PRN

## 2017-10-12 MED ORDER — ACETAMINOPHEN 650 MG RE SUPP: 650.0000 mg | RECTAL | Status: DC | PRN

## 2017-10-12 MED ORDER — ONDANSETRON HCL 4 MG/2ML IJ SOLN: 4.0000 mg | Freq: Three times a day (TID) | INTRAMUSCULAR | Status: DC | PRN

## 2017-10-12 MED ORDER — ROSUVASTATIN CALCIUM 10 MG PO TABS
10.0000 mg | ORAL_TABLET | ORAL | Status: DC
  Filled 2017-10-12: qty 1

## 2017-10-12 NOTE — ED Notes (Signed)
Pt ambulated to bathroom without difficulty.

## 2017-10-12 NOTE — ED Notes (Signed)
[facility] notified Madison Erickson) - patient ready for transport @ 19:21

## 2017-10-12 NOTE — Progress Notes (Signed)
Called by EDP from [facility]: 76/F h/o HTN, dyslipidemia, presented with R arm numbness , word finding difficulty and slurred speech, no headache, now resolved CT negative, needs MRI, TIA/stroke workup Accepted to Tele as Observation  Domenic Polite, MD

## 2017-10-12 NOTE — H&P (Signed)
History and Physical    Madison Erickson:[id number] DOB: August 09, 1940 DOA: 10/12/2017  Referring MD/NP/PA:   PCP: Burnis Medin, MD   Patient coming from:  The patient is coming from home.  At baseline, pt is independent for most of ADL.   Chief Complaint: Right arm and right facial numbness, difficulty speaking, vision change  HPI: Madison Erickson is a 77 y.o. female with medical history significant of hypertension, hyperlipidemia, prediabetes, asthma, migraine, GERD, bronchiectasis (partial lobectomy), who presents with right arm and right facial numbness, difficulty speaking and vision change  Patient states that she had an episode of vision change at about 1:15 PM.  She states that she saw a spot in the right eye, which lasted for a few minutes and resolved spontaneously.  Then she had episode of right arm and right facial numbness, and difficulty speaking out, which lasted for about 15 to 20 minutes, then resolved spontaneously.  No weakness in extremities.  No facial droop or hearing loss.  Patient states that she had history of migraine when she was young 40 years ago. That time, she had similar symptoms.  She did not have migraine in the past 40 years.  Today patient does not have any headache.  Denies chest pain, shortness of breath, cough, fever or chills.  No nausea, vomiting, diarrhea or abdominal pain.  No symptoms of UTI.  ED Course: pt was found to have WBC 9.5, electrolytes renal function okay, temperature 99.7, no tachycardia, no tachypnea, oxygen saturation 98% on room air.  Patient is placed on telemetry bed for observation  CT head: 1. Subcortical white matter changes bilaterally are nonspecific, but likely reflect the sequela of chronic microvascular ischemia. 2. Thickening of the inner table of the left frontal skull versus is dural lesion. This may represent a small meningioma or remote trauma.    Review of Systems:   General: no fevers, chills, no body weight gain,  fatigue HEENT: no blurry vision, hearing changes or sore throat Respiratory: no dyspnea, coughing, wheezing CV: no chest pain, no palpitations GI: no nausea, vomiting, abdominal pain, diarrhea, constipation GU: no dysuria, burning on urination, increased urinary frequency, hematuria  Ext: no leg edema Neuro: Right arm and right facial numbness, difficulty speaking, vision change Skin: no rash, no skin tear. MSK: No muscle spasm, no deformity, no limitation of range of movement in spin Heme: No easy bruising.  Travel history: No recent long distant travel.  Allergy:  Allergies  Allergen Reactions  . Atorvastatin     Muscle aches  . Sulfamethoxazole Other (See Comments)    Could never take this because it negatively affected her "white cells and kidneys" (had Bright's Disese)  . Tape     Can tolerate paper tape; is sensitive    Past Medical History:  Diagnosis Date  . Abnormal LFTs    fatty liver on [place] MRI faay liver and hemangioma 2008 biopsy 2011  . Asthma    allergy  . Benign tumor   . Bladder polyps   . Blood transfusion 1969  . Blood transfusion abn reaction or complication, no procedure mishap   . Cancer (HCC)    squameous cell lsft leg hx  . Colon polyp 2009   Tubulovillous adenoma   . Diverticulosis of colon   . Eczema   . Fatty liver disease, nonalcoholic    confirmed biopsy 2011 mild no fibrosis   . GERD (gastroesophageal reflux disease)   . Hematuria    urethral polyps  .  HT (hammer toe)   . Hypertension   . Kidney disease    nephritis as  child. polpys, hematuria  . Lichen sclerosus    gyne care topical steroids  . Migraines    hx migraines  . Osteoarthritis   . Shortness of breath    partial lower rt lung lobectomy.  . Status post partial lobectomy of lung    for bronchiesctesis  . Urinary incontinence   . UTI (lower urinary tract infection)     Past Surgical History:  Procedure Laterality Date  . APPENDECTOMY    . caesarean section     X 3   . KNEE ARTHROSCOPY  12   rt  . left knee surgery on torn mansicus  11  . LUMBAR LAMINECTOMY/DECOMPRESSION MICRODISCECTOMY  03/23/2011   Procedure: LUMBAR LAMINECTOMY/DECOMPRESSION MICRODISCECTOMY 1 LEVEL;  Surgeon: Peggyann Shoals, MD;  Location: [facility];  Service: Neurosurgery;  Laterality: Left;  Left Lumbar four-five laminectomy and microdiscectomy  . PERCUTANEOUS LIVER BIOPSY  2011   fatty liver no fibrosis  . rt knee surgery  2012  . rt lung lobectomy     for bronchiectasis  . VAGINAL HYSTERECTOMY      Social History:  reports that she has never smoked. She has never used smokeless tobacco. She reports that she drinks alcohol. She reports that she does not use drugs.  Family History:  Family History  Problem Relation Age of Onset  . Cancer Mother        lung  . Heart disease Mother   . Diabetes Paternal Grandfather   . Cancer Paternal Grandfather        bladder  . Mental retardation Other      Prior to Admission medications   Medication Sig Start Date End Date Taking? Authorizing Provider  cetirizine (ZYRTEC) 10 MG tablet Take 10 mg by mouth daily.      [provider]  clobetasol (TEMOVATE) 0.05 % cream Apply 1 application topically 2 (two) times daily as needed. For irritation of skin     [provider]  esomeprazole (NEXIUM) 40 MG capsule TAKE 1 CAPSULE DAILY BEFORE BREAKFAST 02/07/17   Panosh, Standley Brooking, MD  hydrochlorothiazide (HYDRODIURIL) 25 MG tablet TAKE 1 TABLET DAILY 05/10/17   Panosh, Standley Brooking, MD  metoprolol succinate (TOPROL XL) 50 MG 24 hr tablet TAKE 1 TABLET DAILY WITH OR IMMEDIATELY FOLLOWING A MEAL 05/10/17   Panosh, Standley Brooking, MD  nitrofurantoin, macrocrystal-monohydrate, (MACROBID) 100 MG capsule Take 1 capsule (100 mg total) by mouth 2 (two) times daily. 02/10/17   Tammi Sou, MD  rosuvastatin (CRESTOR) 10 MG tablet TAKE 1 TABLET DAILY 08/17/17   Panosh, Standley Brooking, MD    Physical Exam: Vitals:   10/12/17 1730 10/12/17 1900  10/12/17 1930 10/12/17 2048  BP: (!) 187/80 (!) 142/74 (!) 149/73   Pulse: 70 70 70 76  Resp: 14 15 17 16   Temp:    99.7 F (37.6 C)  TempSrc:    Oral  SpO2: 100% 97% 98% 98%  Weight:      Height:       General: Not in acute distress HEENT:       Eyes: PERRL, EOMI, no scleral icterus.       ENT: No discharge from the ears and nose, no pharynx injection, no tonsillar enlargement.        Neck: No JVD, no bruit, no mass felt. Heme: No neck lymph node enlargement. Cardiac: S1/S2, RRR, No murmurs,  No gallops or rubs. Respiratory:  No rales, wheezing, rhonchi or rubs. GI: Soft, nondistended, nontender, no rebound pain, no organomegaly, BS present. GU: No hematuria Ext: No pitting leg edema bilaterally. 2+DP/PT pulse bilaterally. Musculoskeletal: No joint deformities, No joint redness or warmth, no limitation of ROM in spin. Skin: No rashes.  Neuro: Alert, oriented X3, cranial nerves II-XII grossly intact, moves all extremities normally. Psych: Patient is not psychotic, no suicidal or hemocidal ideation.  Labs on Admission: I have personally reviewed following labs and imaging studies  CBC: Recent Labs  Lab 10/12/17 1439  WBC 9.5  NEUTROABS 5.3  HGB 14.7  HCT 42.4  MCV 86.0  PLT 932   Basic Metabolic Panel: Recent Labs  Lab 10/12/17 1439  NA 139  K 3.6  CL 104  CO2 27  GLUCOSE 100*  BUN 15  CREATININE 0.63  CALCIUM 9.7   GFR: Estimated Creatinine Clearance: 55.7 mL/min (by C-G formula based on SCr of 0.63 mg/dL). Liver Function Tests: Recent Labs  Lab 10/12/17 1439  AST 23  ALT 21  ALKPHOS 46  BILITOT 0.6  PROT 7.0  ALBUMIN 4.4   No results for input(s): LIPASE, AMYLASE in the last 168 hours. No results for input(s): AMMONIA in the last 168 hours. Coagulation Profile: Recent Labs  Lab 10/12/17 1439  INR 1.02   Cardiac Enzymes: Recent Labs  Lab 10/12/17 1439  TROPONINI <0.03   BNP (last 3 results) No results for input(s): PROBNP in the last  8760 hours. HbA1C: No results for input(s): HGBA1C in the last 72 hours. CBG: Recent Labs  Lab 10/12/17 1421  GLUCAP 94   Lipid Profile: No results for input(s): CHOL, HDL, LDLCALC, TRIG, CHOLHDL, LDLDIRECT in the last 72 hours. Thyroid Function Tests: No results for input(s): TSH, T4TOTAL, FREET4, T3FREE, THYROIDAB in the last 72 hours. Anemia Panel: No results for input(s): VITAMINB12, FOLATE, FERRITIN, TIBC, IRON, RETICCTPCT in the last 72 hours. Urine analysis:    Component Value Date/Time   COLORURINE YELLOW 10/12/2017 [place] 10/12/2017 1509   LABSPEC 1.010 10/12/2017 1509   PHURINE 7.0 10/12/2017 1509   GLUCOSEU NEGATIVE 10/12/2017 1509   GLUCOSEU NEGATIVE 12/28/2014 1105   HGBUR MODERATE (A) 10/12/2017 1509   HGBUR 2+ 10/29/2009 1423   BILIRUBINUR NEGATIVE 10/12/2017 1509   BILIRUBINUR neg 02/10/2017 1224   KETONESUR NEGATIVE 10/12/2017 1509   PROTEINUR NEGATIVE 10/12/2017 1509   UROBILINOGEN 1.0 02/10/2017 1224   UROBILINOGEN 0.2 12/28/2014 1105   NITRITE NEGATIVE 10/12/2017 1509   LEUKOCYTESUR NEGATIVE 10/12/2017 1509   Sepsis Labs: @LABRCNTIP (procalcitonin:4,lacticidven:4) )No results found for this or any previous visit (from the past 240 hour(s)).   Radiological Exams on Admission: Ct Head Wo Contrast  Addendum Date: 10/12/2017   ADDENDUM REPORT: 10/12/2017 15:18 ADDENDUM: Voice recognition error: The second sentence of the impressions section should read "No acute intracranial abnormality. " Electronically Signed   By: San Morelle M.D.   On: 10/12/2017 15:18   Result Date: 10/12/2017 CLINICAL DATA:  Sudden onset confusion and slurred speech. Numbness involving the right hand and right side of face. EXAM: CT HEAD WITHOUT CONTRAST TECHNIQUE: Contiguous axial images were obtained from the base of the skull through the vertex without intravenous contrast. COMPARISON:  None. FINDINGS: Brain: No acute cortical infarct, hemorrhage, or mass  lesion is present. Scattered subcortical white matter changes are present bilaterally. Basal ganglia are intact. Insular ribbon is normal. No acute or focal cortical lesion is present. The ventricles are within  normal limits. No significant extraaxial fluid collection is present. The brainstem and cerebellum are normal. Vascular: Atherosclerotic calcifications are present at the cavernous internal carotid arteries bilaterally. There is no hyperdense vessel. Skull: Calvarium is intact. No focal lytic or blastic lesions are present. Focal thickening of the inner table on image 21 of series 3 represents either hyperostosis or possibly a intraosseous meningioma. This appears benign. Sinuses/Orbits: The paranasal sinuses and mastoid air cells are clear. Bilateral lens replacements are present. Globes and orbits are within normal limits. IMPRESSION: 1. Subcortical white matter changes bilaterally are nonspecific, but likely reflect the sequela of chronic microvascular ischemia. 2. Acute intracranial abnormality. 3. Thickening of the inner table of the left frontal skull versus is dural lesion. This may represent a small meningioma or remote trauma. Electronically Signed: By: San Morelle M.D. On: 10/12/2017 14:50     EKG: Independently reviewed.  Sinus rhythm, QTC 460, LAD, LAE, poor R wave progression  Assessment/Plan Principal Problem:   TIA (transient ischemic attack) Active Problems:   Essential hypertension   Asthma   Hyperlipidemia   GERD (gastroesophageal reflux disease)   Migraine   TIA (transient ischemic attack) vs. Migraine: Patient had transient TIA-like symptoms.  She reports she had similar symptoms when she had migraine 40 years ago.  She did not have migraine in the past 40 years.  No any headache today.  Patient has multiple risk factors, including hypertension, hyperlipidemia, prediabetes and old age, will need to rule out TIA.  - will place to tele bed -Atrial fibrillation:  not present  - Risk factor modification: HgbA1c, fasting lipid panel - MRI, MRA of the brain without contrast  - PT consult, OT consult - Bedside swallowing screen was ordered, will get speech consult in AM - 2 d Echocardiogram  - Carotid dopplers  - start Aspirin  Essential hypertension: -Hold HCTZ to avoid dehydration -Continue metoprolol -IV hydralazine as needed  Asthma: Stable.  Asymptomatic. -Observe closely  Hyperlipidemia: -Crestor  GERD: -Protonix  Abnormal findings on CT-head: CT-head showed thickening of the inner table of the left frontal skull versus is dural lesion. This may represent a small meningioma or remote trauma per radiologist -f/u MRI of brain   DVT ppx: SQ Lovenox Code Status: Full code Family Communication: None at bed side.  Disposition Plan:  Anticipate discharge back to previous home environment Consults called:  none Admission status: Obs / tele   Date of Service 10/12/2017    Ivor Costa [facility] Pager [phone]  If 7PM-7AM, please contact night-coverage [email or web address] Password [facility] 10/12/2017, 10:34 PM

## 2017-10-12 NOTE — ED Notes (Signed)
Dr. Mesner at bedside   

## 2017-10-12 NOTE — ED Triage Notes (Addendum)
30 minutes ago she had sudden onset of confusion and slurred speech. Her right hand and the right side of her face were numbness.

## 2017-10-12 NOTE — ED Provider Notes (Signed)
[facility] EMERGENCY DEPARTMENT Provider Note   CSN: [id number] Arrival date & time: 10/12/17  1410     History   Chief Complaint Chief Complaint  Patient presents with  . Aphasia    HPI Madison Erickson is a 77 y.o. female.  Patient with history of hypertension, migraines 40 years ago who presents to the ED following episode of right upper extremity, right facial numbness and difficulty with speech.  Patient states that about 45 minutes ago she had right-sided upper extremity and facial numbness that has now resolved.  She appears to have had an expressive aphasia as she was unable to say what she wanted to say.  She does not think there is any facial droop.  No weakness of her extremities.  States that she feels better now.  She states that she thought she had some loss of vision in the right eye.  She does have a history of migraines about 40 years ago in which she states that she used to have auras of right hand numbness and tingling and sometimes her right face.  But does not have a headache.  Typically does not get bad headaches and previously she has not had one in 40 years.  No history of stroke.  The history is provided by the patient.  Neurologic Problem  This is a new problem. The current episode started less than 1 hour ago. The problem has been resolved. Pertinent negatives include no chest pain, no abdominal pain, no headaches and no shortness of breath. Nothing aggravates the symptoms. Nothing relieves the symptoms. She has tried nothing for the symptoms. The treatment provided no relief.    Past Medical History:  Diagnosis Date  . Abnormal LFTs    fatty liver on [place] MRI faay liver and hemangioma 2008 biopsy 2011  . Asthma    allergy  . Benign tumor   . Bladder polyps   . Blood transfusion 1969  . Blood transfusion abn reaction or complication, no procedure mishap   . Cancer (HCC)    squameous cell lsft leg hx  . Colon polyp 2009   Tubulovillous adenoma     . Diverticulosis of colon   . Eczema   . Fatty liver disease, nonalcoholic    confirmed biopsy 2011 mild no fibrosis   . GERD (gastroesophageal reflux disease)   . Hematuria    urethral polyps  . HT (hammer toe)   . Hypertension   . Kidney disease    nephritis as  child. polpys, hematuria  . Lichen sclerosus    gyne care topical steroids  . Migraines    hx migraines  . Osteoarthritis   . Shortness of breath    partial lower rt lung lobectomy.  . Status post partial lobectomy of lung    for bronchiesctesis  . Urinary incontinence   . UTI (lower urinary tract infection)     Patient Active Problem List   Diagnosis Date Noted  . Fasting hyperglycemia 04/21/2014  . Hyperlipidemia 04/21/2014  . Pre-diabetes 08/18/2013  . Osteopenia 04/24/2013  . Medicare annual wellness visit, subsequent 04/15/2013  . Lichen sclerosus   . Bladder polyps   . Mass of Achilles tendon 02/17/2012  . Fatty liver disease, nonalcoholic   . Knee pain, right 09/06/2010  . Allergic rhinitis, seasonal 04/22/2010  . Status post partial lobectomy of lung   . ASTHMA UNSPECIFIED 01/27/2010  . DIVERTICULITIS-COLON 01/27/2010  . PERSONAL HX COLONIC POLYPS 01/27/2010  . DIVERTICULOSIS, COLON 10/30/2009  .  OTHER SPECIFIED DISEASE OF NAIL 03/26/2009  . URINARY TRACT INFECTION, RECURRENT 01/04/2009  . HYPERGLYCEMIA, FASTING 01/04/2009  . LIVER HEMANGIOMA 10/26/2008  . FATTY LIVER DISEASE 10/26/2008  . NONSPECIFIC ABNORMAL RESULTS LIVR FUNCTION STUDY 11/20/2007  . HEMATURIA, MICROSCOPIC, HX OF 11/20/2007  . FATIGUE 10/16/2007  . HYPERLIPIDEMIA 04/29/2007  . Essential hypertension 04/29/2007  . HYPERTENSION NEC 04/29/2007  . OSTEOARTHRITIS 11/19/2006  . URINARY INCONTINENCE 11/19/2006    Past Surgical History:  Procedure Laterality Date  . APPENDECTOMY    . caesarean section     X 3  . KNEE ARTHROSCOPY  12   rt  . left knee surgery on torn mansicus  11  . LUMBAR LAMINECTOMY/DECOMPRESSION  MICRODISCECTOMY  03/23/2011   Procedure: LUMBAR LAMINECTOMY/DECOMPRESSION MICRODISCECTOMY 1 LEVEL;  Surgeon: Peggyann Shoals, MD;  Location: [facility];  Service: Neurosurgery;  Laterality: Left;  Left Lumbar four-five laminectomy and microdiscectomy  . PERCUTANEOUS LIVER BIOPSY  2011   fatty liver no fibrosis  . rt knee surgery  2012  . rt lung lobectomy     for bronchiectasis  . VAGINAL HYSTERECTOMY       OB History   None      Home Medications    Prior to Admission medications   Medication Sig Start Date End Date Taking? Authorizing Provider  cetirizine (ZYRTEC) 10 MG tablet Take 10 mg by mouth daily.      [provider]  clobetasol (TEMOVATE) 0.05 % cream Apply 1 application topically 2 (two) times daily as needed. For irritation of skin     [provider]  esomeprazole (NEXIUM) 40 MG capsule TAKE 1 CAPSULE DAILY BEFORE BREAKFAST 02/07/17   Panosh, Standley Brooking, MD  hydrochlorothiazide (HYDRODIURIL) 25 MG tablet TAKE 1 TABLET DAILY 05/10/17   Panosh, Standley Brooking, MD  metoprolol succinate (TOPROL XL) 50 MG 24 hr tablet TAKE 1 TABLET DAILY WITH OR IMMEDIATELY FOLLOWING A MEAL 05/10/17   Panosh, Standley Brooking, MD  nitrofurantoin, macrocrystal-monohydrate, (MACROBID) 100 MG capsule Take 1 capsule (100 mg total) by mouth 2 (two) times daily. 02/10/17   McGowen, Adrian Blackwater, MD  rosuvastatin (CRESTOR) 10 MG tablet TAKE 1 TABLET DAILY 08/17/17   Panosh, Standley Brooking, MD    Family History Family History  Problem Relation Age of Onset  . Cancer Mother        lung  . Heart disease Mother   . Diabetes Paternal Grandfather   . Cancer Paternal Grandfather        bladder  . Mental retardation Other     Social History Social History   Tobacco Use  . Smoking status: Never Smoker  . Smokeless tobacco: Never Used  . Tobacco comment: wine q 2-3 weeks  Substance Use Topics  . Alcohol use: Yes    Alcohol/week: 0.0 standard drinks    Comment: 4-5 per year   . Drug use: No      Allergies   Atorvastatin and Sulfamethoxazole   Review of Systems Review of Systems  Constitutional: Negative for chills and fever.  HENT: Negative for ear pain and sore throat.   Eyes: Positive for visual disturbance. Negative for pain.  Respiratory: Negative for cough and shortness of breath.   Cardiovascular: Negative for chest pain and palpitations.  Gastrointestinal: Negative for abdominal pain and vomiting.  Genitourinary: Negative for dysuria and hematuria.  Musculoskeletal: Negative for arthralgias and back pain.  Skin: Negative for color change and rash.  Neurological: Positive for speech difficulty and numbness. Negative for  dizziness, tremors, seizures, syncope, facial asymmetry, weakness, light-headedness and headaches.  All other systems reviewed and are negative.    Physical Exam Updated Vital Signs  ED Triage Vitals  Enc Vitals Group     BP 10/12/17 1416 (!) 191/86     Pulse Rate 10/12/17 1416 79     Resp 10/12/17 1416 16     Temp 10/12/17 1416 99.2 F (37.3 C)     Temp Source 10/12/17 1416 Oral     SpO2 10/12/17 1416 100 %     Weight 10/12/17 1417 152 lb (68.9 kg)     Height 10/12/17 1417 5\' 3"  (1.6 m)     Head Circumference --      Peak Flow --      Pain Score 10/12/17 1415 0     Pain Loc --      Pain Edu? --      Excl. in [place]? --     Physical Exam  Constitutional: She is oriented to person, place, and time. She appears well-developed and well-nourished. No distress.  HENT:  Head: Normocephalic and atraumatic.  Eyes: Pupils are equal, round, and reactive to light. Conjunctivae and EOM are normal.  Neck: Normal range of motion. Neck supple.  Cardiovascular: Normal rate, regular rhythm, normal heart sounds and intact distal pulses.  No murmur heard. Pulmonary/Chest: Effort normal and breath sounds normal. No respiratory distress.  Abdominal: Soft. There is no tenderness.  Musculoskeletal: Normal range of motion. She exhibits no edema.   Neurological: She is alert and oriented to person, place, and time. No cranial nerve deficit or sensory deficit. She exhibits normal muscle tone. Coordination normal.  20/20 vision bilaterally, no visual field cut off, 5+ out of 5 strength, normal sensation, no drift, normal finger-to-nose finger  Skin: Skin is warm and dry.  Psychiatric: She has a normal mood and affect.  Nursing note and vitals reviewed.    ED Treatments / Results  Labs (all labs ordered are listed, but only abnormal results are displayed) Labs Reviewed  COMPREHENSIVE METABOLIC PANEL - Abnormal; Notable for the following components:      Result Value   Glucose, Bld 100 (*)    All other components within normal limits  PROTIME-INR  APTT  CBC  DIFFERENTIAL  TROPONIN I  ETHANOL  RAPID URINE DRUG SCREEN, HOSP PERFORMED  URINALYSIS, ROUTINE W REFLEX MICROSCOPIC  CBG MONITORING, ED    EKG EKG Interpretation  Date/Time:  Friday October 12 2017 14:27:10 EDT Ventricular Rate:  77 PR Interval:    QRS Duration: 94 QT Interval:  406 QTC Calculation: 460 R Axis:   -20 Text Interpretation:  Sinus rhythm Borderline left axis deviation Abnormal R-wave progression, late transition Confirmed by Lennice Sites [phone]) on 10/12/2017 3:22:00 PM   Radiology Ct Head Wo Contrast  Addendum Date: 10/12/2017   ADDENDUM REPORT: 10/12/2017 15:18 ADDENDUM: Voice recognition error: The second sentence of the impressions section should read "No acute intracranial abnormality. " Electronically Signed   By: San Morelle M.D.   On: 10/12/2017 15:18   Result Date: 10/12/2017 CLINICAL DATA:  Sudden onset confusion and slurred speech. Numbness involving the right hand and right side of face. EXAM: CT HEAD WITHOUT CONTRAST TECHNIQUE: Contiguous axial images were obtained from the base of the skull through the vertex without intravenous contrast. COMPARISON:  None. FINDINGS: Brain: No acute cortical infarct, hemorrhage, or mass  lesion is present. Scattered subcortical white matter changes are present bilaterally. Basal ganglia are intact. Insular ribbon  is normal. No acute or focal cortical lesion is present. The ventricles are within normal limits. No significant extraaxial fluid collection is present. The brainstem and cerebellum are normal. Vascular: Atherosclerotic calcifications are present at the cavernous internal carotid arteries bilaterally. There is no hyperdense vessel. Skull: Calvarium is intact. No focal lytic or blastic lesions are present. Focal thickening of the inner table on image 21 of series 3 represents either hyperostosis or possibly a intraosseous meningioma. This appears benign. Sinuses/Orbits: The paranasal sinuses and mastoid air cells are clear. Bilateral lens replacements are present. Globes and orbits are within normal limits. IMPRESSION: 1. Subcortical white matter changes bilaterally are nonspecific, but likely reflect the sequela of chronic microvascular ischemia. 2. Acute intracranial abnormality. 3. Thickening of the inner table of the left frontal skull versus is dural lesion. This may represent a small meningioma or remote trauma. Electronically Signed: By: San Morelle M.D. On: 10/12/2017 14:50    Procedures Procedures (including critical care time)  Medications Ordered in ED Medications - No data to display   Initial Impression / Assessment and Plan / ED Course  I have reviewed the triage vital signs and the nursing notes.  Pertinent labs & imaging results that were available during my care of the patient were reviewed by me and considered in my medical decision making (see chart for details).     Madison Erickson is a 77 year old female with history of hypertension who presents to the ED with right-sided facial numbness, right-sided arm numbness, aphasia.  Patient hypertension upon arrival.  Otherwise unremarkable vitals.  Patient denies any history of strokes.  At my evaluation  patient is asymptomatic.  About 45 minutes ago patient developed right sided arm numbness, right-sided facial numbness, difficulty with speech that has self resolved. She was unable to say the words she wanted to say. No weakness or drop during event. Possible right eye visual disturbance. She states that she has a history of migraines in which she used to have auras with numbness in her right hand.  She does not have a headache.  Has not had these type of migraines in over 40 years.  States that she has never had a stroke.  She denies any chest pain, shortness of breath, urinary symptoms.  Patient is overall well-appearing.  She has normal neurological exam.  Concern for TIA versus complex migraine.  Neurology consulted.  CT scan of the head shows no acute findings.  She does appear to have some chronic microvascular ischemia.  She had no significant findings on EKG.  Lab work shows no significant anemia, no electrolyte abnormality, no kidney injury.  Troponin within normal limits. Patient asymptomatic, no headache at time of ED handoff.  Awaiting final neurology recommendations. U/a pending at time of ED handoff to Dr. Dayna Barker, please see his note for further eval, recs, dispo.  Final Clinical Impressions(s) / ED Diagnoses   Final diagnoses:  Stroke-like symptoms    ED Discharge Orders    None       Lennice Sites, DO 10/12/17 1550

## 2017-10-12 NOTE — ED Provider Notes (Signed)
3:54 PM Assumed care from Dr. Ronnald Nian, please see their note for full history, physical and decision making until this point. In brief this is a 77 y.o. year old female who presented to the ED tonight with Aphasia     Had a brief episode of right-sided symptoms along with her vision and likely expressive aphasia that resolved prior to evaluation here.  Concern for TIA awaiting neurology recommendations.  Cussed with the tele-neurologist who recommends observation work-up for TIA.  Consult hospitalist.  Dr. Broadus John at [facility] accepts to tele bed.  Labs, studies and imaging reviewed by myself and considered in medical decision making if ordered. Imaging interpreted by radiology.  Labs Reviewed  COMPREHENSIVE METABOLIC PANEL - Abnormal; Notable for the following components:      Result Value   Glucose, Bld 100 (*)    All other components within normal limits  PROTIME-INR  APTT  CBC  DIFFERENTIAL  TROPONIN I  ETHANOL  RAPID URINE DRUG SCREEN, HOSP PERFORMED  URINALYSIS, ROUTINE W REFLEX MICROSCOPIC  CBG MONITORING, ED    CT HEAD WO CONTRAST  Final Result  Addendum 1 of 1  ADDENDUM REPORT: 10/12/2017 15:18    ADDENDUM:  Voice recognition error: The second sentence of the impressions  section should read "No acute intracranial abnormality. "      Electronically Signed    By: San Morelle M.D.    On: 10/12/2017 15:18      Final      No follow-ups on file.    Madison Erickson, Corene Cornea, MD 10/12/17 1747

## 2017-10-13 ENCOUNTER — Observation Stay (HOSPITAL_BASED_OUTPATIENT_CLINIC_OR_DEPARTMENT_OTHER): Payer: Medicare Other

## 2017-10-13 ENCOUNTER — Observation Stay (HOSPITAL_COMMUNITY): Payer: Medicare Other

## 2017-10-13 DIAGNOSIS — G459 Transient cerebral ischemic attack, unspecified: Secondary | ICD-10-CM

## 2017-10-13 DIAGNOSIS — R4789 Other speech disturbances: Secondary | ICD-10-CM | POA: Diagnosis not present

## 2017-10-13 LAB — BASIC METABOLIC PANEL
ANION GAP: 11 (ref 5–15)
BUN: 13 mg/dL (ref 8–23)
CALCIUM: 9.9 mg/dL (ref 8.9–10.3)
CO2: 27 mmol/L (ref 22–32)
CREATININE: 0.67 mg/dL (ref 0.44–1.00)
Chloride: 105 mmol/L (ref 98–111)
GFR calc Af Amer: >60 mL/min (ref >60)
GFR calc non Af Amer: >60 mL/min (ref >60)
Glucose, Bld: 129 mg/dL — ABNORMAL HIGH (ref 70–99)
Potassium: 3.6 mmol/L (ref 3.5–5.1)
Sodium: 143 mmol/L (ref 135–145)

## 2017-10-13 LAB — LIPID PANEL
CHOL/HDL RATIO: 4.1 ratio
Cholesterol: 119 mg/dL (ref 0–200)
HDL: 29 mg/dL — ABNORMAL LOW (ref >40)
LDL CALC: 60 mg/dL (ref 0–99)
Triglycerides: 149 mg/dL (ref <150)
VLDL: 30 mg/dL (ref 0–40)

## 2017-10-13 LAB — HEMOGLOBIN A1C
Hgb A1c MFr Bld: 5.9 % — ABNORMAL HIGH (ref 4.8–5.6)
Mean Plasma Glucose: 122.63 mg/dL

## 2017-10-13 MED ORDER — ASPIRIN EC 81 MG PO TBEC: 81.0000 mg | DELAYED_RELEASE_TABLET | Freq: Every day | ORAL | Status: DC

## 2017-10-13 MED ORDER — ASPIRIN EC 81 MG PO TBEC: 81.0000 mg | DELAYED_RELEASE_TABLET | Freq: Every day | ORAL | 2 refills | Status: DC

## 2017-10-13 MED ORDER — HYDROCHLOROTHIAZIDE 25 MG PO TABS: 25.0000 mg | ORAL_TABLET | Freq: Every day | ORAL | Status: DC

## 2017-10-13 MED ORDER — ASPIRIN 81 MG PO TBEC: 81.0000 mg | DELAYED_RELEASE_TABLET | Freq: Every day | ORAL | 0 refills | Status: DC

## 2017-10-13 NOTE — Evaluation (Signed)
Physical Therapy Evaluation Patient Details Name: Madison Erickson MRN: [id number] DOB: 04-13-1940 Today's Date: 10/13/2017   History of Present Illness  Pt is a 77 y.o. female with medical history significant of hypertension, hyperlipidemia, prediabetes, asthma, migraine, GERD, bronchiectasis (partial lobectomy), who presents with right arm and right facial numbness, difficulty speaking and vision change. MRI shows no acute abnormalities.      Clinical Impression  Patient evaluated by Physical Therapy with no further acute PT needs identified. All education has been completed and the patient has no further questions. PTA, pt independent. Today pt ambulating well without AD, no LOB, motor sensation coordination fully intact. encouraged daily exercise recommendations and lifestyle changes for reduction of risk. See below for any follow-up Physical Therapy or equipment needs. PT is signing off. Thank you for this referral.     Follow Up Recommendations No PT follow up    Equipment Recommendations  None recommended by PT    Recommendations for Other Services       Precautions / Restrictions Precautions Precautions: None Restrictions Weight Bearing Restrictions: No      Mobility  Bed Mobility Overal bed mobility: Modified Independent             General bed mobility comments: HOB elevated, no physical assist required  Transfers Overall transfer level: Independent Equipment used: None                Ambulation/Gait Ambulation/Gait assistance: Independent              Stairs Stairs: Yes Stairs assistance: Independent        Wheelchair Mobility    Modified Rankin (Stroke Patients Only)       Balance Overall balance assessment: No apparent balance deficits (not formally assessed)                                           Pertinent Vitals/Pain Pain Assessment: No/denies pain    Home Living Family/patient expects to be discharged  to:: Private residence Living Arrangements: Spouse/significant other Available Help at Discharge: Family;Available 24 hours/day Type of Home: House Home Access: Stairs to enter Entrance Stairs-Rails: None Entrance Stairs-Number of Steps: 2 Home Layout: One level Home Equipment: None      Prior Function                 Hand Dominance   Dominant Hand: Right    Extremity/Trunk Assessment   Upper Extremity Assessment Upper Extremity Assessment: Overall WFL for tasks assessed    Lower Extremity Assessment Lower Extremity Assessment: Defer to PT evaluation       Communication      Cognition Arousal/Alertness: Awake/alert Behavior During Therapy: WFL for tasks assessed/performed Overall Cognitive Status: Within Functional Limits for tasks assessed                                        General Comments General comments (skin integrity, edema, etc.): Extesnive dicssion of risk reduction, BEFAST, daily aerobic exercises etc    Exercises     Assessment/Plan    PT Assessment Patent does not need any further PT services  PT Problem List         PT Treatment Interventions      PT Goals (Current goals can be found in the Care  Plan section)  Acute Rehab PT Goals Patient Stated Goal: return home, maintain her independence PT Goal Formulation: With patient    Frequency     Barriers to discharge        Co-evaluation               AM-PAC PT "6 Clicks" Daily Activity  Outcome Measure Difficulty turning over in bed (including adjusting bedclothes, sheets and blankets)?: None Difficulty moving from lying on back to sitting on the side of the bed? : None Difficulty sitting down on and standing up from a chair with arms (e.g., wheelchair, bedside commode, etc,.)?: None Help needed moving to and from a bed to chair (including a wheelchair)?: None Help needed walking in hospital room?: None Help needed climbing 3-5 steps with a railing? :  None 6 Click Score: 24    End of Session Equipment Utilized During Treatment: Gait belt Activity Tolerance: Patient tolerated treatment well Patient left: in bed;with call bell/phone within reach;with family/visitor present   PT Visit Diagnosis: Other symptoms and signs involving the nervous system (R29.898)    Time: 1043-1100 PT Time Calculation (min) (ACUTE ONLY): 17 min   Charges:   PT Evaluation $PT Eval Low Complexity: 1 Low         Reinaldo Berber, PT, DPT Acute Rehabilitation Services Pager: [phone] Office: [place] 10/13/2017, 1:51 PM

## 2017-10-13 NOTE — Discharge Summary (Signed)
Physician Discharge Summary  Madison Erickson OFB:[id number] DOB: [birth year] DOA: 10/12/2017  PCP: Burnis Medin, MD  Admit date: 10/12/2017 Discharge date: 10/13/2017  Admitted From: Home Disposition: Home  Recommendations for Outpatient Follow-up:  1. Follow up with PCP this coming week 2. Please follow up on the following pending results: Carotid ultrasound preliminarily read as negative awaiting official results.  Home Health: No Equipment/Devices: None  Discharge Condition: Stable CODE STATUS: Full code Diet recommendation: Heart healthy  Brief/Interim Summary: Madison Erickson is a 77 y.o. female with medical history significant of hypertension, hyperlipidemia, prediabetes, asthma, migraine, GERD, bronchiectasis (partial lobectomy), who presents with right arm and right facial numbness, difficulty speaking and vision change.  MRI and MRA of the brain did not show any areas of stenosis or ischemia or infarction.  Her cholesterol profile was acceptable except for a low HDL.  Her hemoglobin A1c was 5.9.  She had noted blood in her urine which was discussed with her.  She says she has this frequently due to cysts and polyps in her kidneys.  Her EKG showed a sinus rhythm therefore echocardiogram was discontinued.  She was seen by PT/OT/ST and cleared for discharge.  Carotid ultrasound is preliminarily negative.  She will discharge home on aspirin 81 mg p.o. daily.  We discussed risk factor modification especially in terms of her "prediabetes".  Talked about an anti-inflammatory low glycemic index diet.  Patient has mentioned that she has gained some weight recently and we talked about weight loss at length.  Patient has reached maximal benefit of hospitalization.  Discharge diagnosis, prognosis, plans, follow-up, medications and treatments discussed with the patient(or responsible party) and is in agreement with the plans as described.  Patient is stable for discharge.  Discharge Diagnoses:   Principal Problem:   TIA (transient ischemic attack) Active Problems:   Essential hypertension   Asthma   Hyperlipidemia   GERD (gastroesophageal reflux disease)   Migraine    Discharge Instructions  Discharge Instructions    Diet - low sodium heart healthy   Complete by:  As directed    Discharge instructions   Complete by:  As directed    Continue taking Crestor 10 mg p.o. daily and start taking aspirin 81 mg daily.  See Dr. Regis Bill in the next 1 week.   Increase activity slowly   Complete by:  As directed      Allergies as of 10/13/2017      Reactions   Atorvastatin Other (See Comments)   Muscle aches   Sulfamethoxazole Other (See Comments)   Could never take this because it negatively affected her "white cells and kidneys" (had Bright's Disese)   Tape Other (See Comments)   Can tolerate paper tape; is sensitive      Medication List    STOP taking these medications   nitrofurantoin (macrocrystal-monohydrate) 100 MG capsule Commonly known as:  MACROBID     TAKE these medications   aspirin 81 MG EC tablet Take 1 tablet (81 mg total) by mouth daily.   cetirizine 10 MG tablet Commonly known as:  ZYRTEC Take 10 mg by mouth daily.   clobetasol cream 0.05 % Commonly known as:  TEMOVATE Apply 1 application topically 2 (two) times daily.   esomeprazole 40 MG capsule Commonly known as:  NEXIUM TAKE 1 CAPSULE DAILY BEFORE BREAKFAST   hydrochlorothiazide 25 MG tablet Commonly known as:  HYDRODIURIL TAKE 1 TABLET DAILY   metoprolol succinate 50 MG 24 hr tablet Commonly known as:  TOPROL-XL TAKE 1 TABLET DAILY WITH OR IMMEDIATELY FOLLOWING A MEAL   METRONIDAZOLE (TOPICAL) 0.75 % Lotn Apply 1 application topically See admin instructions. Apply to face once a day   rosuvastatin 10 MG tablet Commonly known as:  CRESTOR TAKE 1 TABLET DAILY What changed:  when to take this      Follow-up Information    Panosh, Standley Brooking, MD. Schedule an appointment as soon as  possible for a visit in 1 week(s).   Specialties:  Internal Medicine, Pediatrics Contact information: [address] [place] [address] [phone]          Allergies  Allergen Reactions  . Atorvastatin Other (See Comments)    Muscle aches  . Sulfamethoxazole Other (See Comments)    Could never take this because it negatively affected her "white cells and kidneys" (had Bright's Disese)  . Tape Other (See Comments)    Can tolerate paper tape; is sensitive       Procedures/Studies: Ct Head Wo Contrast  Addendum Date: 10/12/2017   ADDENDUM REPORT: 10/12/2017 15:18 ADDENDUM: Voice recognition error: The second sentence of the impressions section should read "No acute intracranial abnormality. " Electronically Signed   By: San Morelle M.D.   On: 10/12/2017 15:18   Result Date: 10/12/2017 CLINICAL DATA:  Sudden onset confusion and slurred speech. Numbness involving the right hand and right side of face. EXAM: CT HEAD WITHOUT CONTRAST TECHNIQUE: Contiguous axial images were obtained from the base of the skull through the vertex without intravenous contrast. COMPARISON:  None. FINDINGS: Brain: No acute cortical infarct, hemorrhage, or mass lesion is present. Scattered subcortical white matter changes are present bilaterally. Basal ganglia are intact. Insular ribbon is normal. No acute or focal cortical lesion is present. The ventricles are within normal limits. No significant extraaxial fluid collection is present. The brainstem and cerebellum are normal. Vascular: Atherosclerotic calcifications are present at the cavernous internal carotid arteries bilaterally. There is no hyperdense vessel. Skull: Calvarium is intact. No focal lytic or blastic lesions are present. Focal thickening of the inner table on image 21 of series 3 represents either hyperostosis or possibly a intraosseous meningioma. This appears benign. Sinuses/Orbits: The paranasal sinuses and mastoid air cells  are clear. Bilateral lens replacements are present. Globes and orbits are within normal limits. IMPRESSION: 1. Subcortical white matter changes bilaterally are nonspecific, but likely reflect the sequela of chronic microvascular ischemia. 2. Acute intracranial abnormality. 3. Thickening of the inner table of the left frontal skull versus is dural lesion. This may represent a small meningioma or remote trauma. Electronically Signed: By: San Morelle M.D. On: 10/12/2017 14:50   Mr Brain Wo Contrast  Result Date: 10/13/2017 CLINICAL DATA:  77 y/o F; right facial numbness, difficulty speaking, vision change. EXAM: MRI HEAD WITHOUT CONTRAST MRA HEAD WITHOUT CONTRAST TECHNIQUE: Multiplanar, multiecho pulse sequences of the brain and surrounding structures were obtained without intravenous contrast. Angiographic images of the head were obtained using MRA technique without contrast. COMPARISON:  10/12/2017 CT head. FINDINGS: MRI HEAD FINDINGS Brain: No acute infarction, hemorrhage, hydrocephalus, extra-axial collection or mass lesion of the brain. Left frontal dural nodule measuring up to 15 mm (series 11, image 24). Several nonspecific T2 FLAIR hyperintensities in subcortical and periventricular white matter are compatible with moderate chronic microvascular ischemic changes for age. Mild volume loss of the brain. Vascular: As below. Skull and upper cervical spine: Normal marrow signal. Sinuses/Orbits: Negative. Other: Bilateral intra-ocular lens replacement. MRA HEAD FINDINGS Internal carotid arteries:  Patent.  Anterior cerebral arteries:  Patent. Middle cerebral arteries: Patent. Anterior communicating artery: Patent. Posterior communicating arteries: Right fetal PCA. No left posterior communicating artery identified, likely hypoplastic or absent. Posterior cerebral arteries:  Patent. Basilar artery:  Patent. Vertebral arteries:  Patent. No evidence of high-grade stenosis, large vessel occlusion, or aneurysm  unless noted above. IMPRESSION: 1. No acute intracranial abnormality identified. 2. Moderate chronic microvascular ischemic changes and mild volume loss of the brain. 3. Normal MRA of the head. Electronically Signed   By: Kristine Garbe M.D.   On: 10/13/2017 05:35   Mr Virgel Paling SH Contrast  Result Date: 10/13/2017 CLINICAL DATA:  76 y/o F; right facial numbness, difficulty speaking, vision change. EXAM: MRI HEAD WITHOUT CONTRAST MRA HEAD WITHOUT CONTRAST TECHNIQUE: Multiplanar, multiecho pulse sequences of the brain and surrounding structures were obtained without intravenous contrast. Angiographic images of the head were obtained using MRA technique without contrast. COMPARISON:  10/12/2017 CT head. FINDINGS: MRI HEAD FINDINGS Brain: No acute infarction, hemorrhage, hydrocephalus, extra-axial collection or mass lesion of the brain. Left frontal dural nodule measuring up to 15 mm (series 11, image 24). Several nonspecific T2 FLAIR hyperintensities in subcortical and periventricular white matter are compatible with moderate chronic microvascular ischemic changes for age. Mild volume loss of the brain. Vascular: As below. Skull and upper cervical spine: Normal marrow signal. Sinuses/Orbits: Negative. Other: Bilateral intra-ocular lens replacement. MRA HEAD FINDINGS Internal carotid arteries:  Patent. Anterior cerebral arteries:  Patent. Middle cerebral arteries: Patent. Anterior communicating artery: Patent. Posterior communicating arteries: Right fetal PCA. No left posterior communicating artery identified, likely hypoplastic or absent. Posterior cerebral arteries:  Patent. Basilar artery:  Patent. Vertebral arteries:  Patent. No evidence of high-grade stenosis, large vessel occlusion, or aneurysm unless noted above. IMPRESSION: 1. No acute intracranial abnormality identified. 2. Moderate chronic microvascular ischemic changes and mild volume loss of the brain. 3. Normal MRA of the head. Electronically  Signed   By: Kristine Garbe M.D.   On: 10/13/2017 05:35    Carotid ultrasound results preliminarily negative but official results are pending at the time of discharge.   Subjective:   Discharge Exam: Vitals:   10/13/17 0542 10/13/17 1229  BP: (!) 143/71 (!) 149/78  Pulse: 81 62  Resp: 16 16  Temp: 98.3 F (36.8 C) 98.2 F (36.8 C)  SpO2: 100% 99%   Vitals:   10/12/17 2048 10/12/17 2356 10/13/17 0542 10/13/17 1229  BP:  (!) 151/65 (!) 143/71 (!) 149/78  Pulse: 76 69 81 62  Resp: 16 16 16 16   Temp: 99.7 F (37.6 C) 98.2 F (36.8 C) 98.3 F (36.8 C) 98.2 F (36.8 C)  TempSrc: Oral Oral Oral Oral  SpO2: 98% 96% 100% 99%  Weight:      Height:        General: Pt is alert, awake, not in acute distress Cardiovascular: RRR, S1/S2 +, no rubs, no gallops Respiratory: CTA bilaterally, no wheezing, no rhonchi Abdominal: Soft, NT, ND, bowel sounds + Extremities: no edema, no cyanosis    The results of significant diagnostics from this hospitalization (including imaging, microbiology, ancillary and laboratory) are listed below for reference.      Labs: Basic Metabolic Panel: Recent Labs  Lab 10/12/17 1439 10/13/17 0609  NA 139 143  K 3.6 3.6  CL 104 105  CO2 27 27  GLUCOSE 100* 129*  BUN 15 13  CREATININE 0.63 0.67  CALCIUM 9.7 9.9   Liver Function Tests: Recent Labs  Lab 10/12/17 1439  AST 23  ALT 21  ALKPHOS 46  BILITOT 0.6  PROT 7.0  ALBUMIN 4.4   CBC: Recent Labs  Lab 10/12/17 1439  WBC 9.5  NEUTROABS 5.3  HGB 14.7  HCT 42.4  MCV 86.0  PLT 229   Cardiac Enzymes: Recent Labs  Lab 10/12/17 1439  TROPONINI <0.03   BNP: Invalid input(s): POCBNP CBG: Recent Labs  Lab 10/12/17 1421  GLUCAP 94   Hgb A1c Recent Labs    10/13/17 0609  HGBA1C 5.9*   Lipid Profile Recent Labs    10/13/17 0609  CHOL 119  HDL 29*  LDLCALC 60  TRIG 149  CHOLHDL 4.1   Urinalysis    Component Value Date/Time   COLORURINE YELLOW  10/12/2017 [place] 10/12/2017 1509   LABSPEC 1.010 10/12/2017 1509   PHURINE 7.0 10/12/2017 1509   GLUCOSEU NEGATIVE 10/12/2017 1509   GLUCOSEU NEGATIVE 12/28/2014 1105   HGBUR MODERATE (A) 10/12/2017 1509   HGBUR 2+ 10/29/2009 1423   BILIRUBINUR NEGATIVE 10/12/2017 1509   BILIRUBINUR neg 02/10/2017 [place] 10/12/2017 1509   PROTEINUR NEGATIVE 10/12/2017 1509   UROBILINOGEN 1.0 02/10/2017 1224   UROBILINOGEN 0.2 12/28/2014 1105   NITRITE NEGATIVE 10/12/2017 1509   LEUKOCYTESUR NEGATIVE 10/12/2017 1509     Time coordinating discharge: Over 42 minutes  SIGNED:   Lady Deutscher, MD  FACP [facility] 10/13/2017, 12:51 PM Pager   If 7PM-7AM, please contact night-coverage [email or web address] Password [email or web address]

## 2017-10-13 NOTE — Evaluation (Signed)
Occupational Therapy Evaluation Patient Details Name: Madison Erickson MRN: [id number] DOB: 1940/05/11 Today's Date: 10/13/2017    History of Present Illness Pt is a 77 y.o. female with medical history significant of hypertension, hyperlipidemia, prediabetes, asthma, migraine, GERD, bronchiectasis (partial lobectomy), who presents with right arm and right facial numbness, difficulty speaking and vision change. MRI shows no acute abnormalities.    Clinical Impression   This 77 y/o female presents with the above. At baseline pt is independent with ADLs, iADLs and functional mobility was active; pt lives with spouse. Pt performing functional mobility without AD and supervision throughout this session, no unsteadiness or overt LOB noted. Pt completing toileting, standing grooming ADLs and demonstrating ability to perform LB ADL with supervision throughout. Pt reports initial symptoms have since subsided and she feels at her baseline regarding ADL and mobility completion. Education completed and questions answered throughout. Feel pt is safe to return home from OT standpoint once medically ready. Do not anticipate pt will require follow up OT services at time of discharge. Acute OT to sign off at this time. Thank you for this referral.     Follow Up Recommendations  No OT follow up;Supervision - Intermittent    Equipment Recommendations  None recommended by OT           Precautions / Restrictions Precautions Precautions: None Restrictions Weight Bearing Restrictions: No      Mobility Bed Mobility Overal bed mobility: Modified Independent             General bed mobility comments: HOB elevated, no physical assist required  Transfers Overall transfer level: Independent Equipment used: None                  Balance Overall balance assessment: No apparent balance deficits (not formally assessed)                                         ADL either performed or  assessed with clinical judgement   ADL Overall ADL's : At baseline                                       General ADL Comments: pt performing toileting, standing grooming ADLs, demonstrates LB ADLs with supervision throughout, no AD required or LOB noted; pt reports feeling at her baseline regarding ADLs and mobility completion     Vision Baseline Vision/History: Wears glasses;Cataracts Wears Glasses: Reading only;Distance only Patient Visual Report: No change from baseline(intially reports an "ora" in R periphery, reports subsided) Vision Assessment?: No apparent visual deficits;Yes Eye Alignment: Within Functional Limits Ocular Range of Motion: Within Functional Limits Alignment/Gaze Preference: Within Defined Limits Tracking/Visual Pursuits: Able to track stimulus in all quads without difficulty Saccades: Within functional limits Visual Fields: No apparent deficits                Pertinent Vitals/Pain Pain Assessment: No/denies pain     Hand Dominance Right   Extremity/Trunk Assessment Upper Extremity Assessment Upper Extremity Assessment: Overall WFL for tasks assessed   Lower Extremity Assessment Lower Extremity Assessment: Defer to PT evaluation       Communication Communication Communication: No difficulties   Cognition Arousal/Alertness: Awake/alert Behavior During Therapy: WFL for tasks assessed/performed Overall Cognitive Status: Within Functional Limits for tasks assessed  Home Living Family/patient expects to be discharged to:: Private residence Living Arrangements: Spouse/significant other Available Help at Discharge: Family;Available 24 hours/day Type of Home: House Home Access: Stairs to enter [employer] of Steps: 2 Entrance Stairs-Rails: None Home Layout: One level     Bathroom Shower/Tub: Tub/shower unit;Walk-in shower   Bathroom Toilet:  Standard     Home Equipment: None          Prior Functioning/Environment Level of Independence: Independent                 OT Problem List: Decreased activity tolerance      OT Treatment/Interventions:      OT Goals(Current goals can be found in the care plan section) Acute Rehab OT Goals Patient Stated Goal: return home, maintain her independence OT Goal Formulation: All assessment and education complete, DC therapy  OT Frequency:     Barriers to D/C:            Co-evaluation              AM-PAC PT "6 Clicks" Daily Activity     Outcome Measure Help from another person eating meals?: None Help from another person taking care of personal grooming?: None Help from another person toileting, which includes using toliet, bedpan, or urinal?: None Help from another person bathing (including washing, rinsing, drying)?: None Help from another person to put on and taking off regular upper body clothing?: None Help from another person to put on and taking off regular lower body clothing?: None 6 Click Score: 24   End of Session Nurse Communication: Mobility status  Activity Tolerance: Patient tolerated treatment well Patient left: in bed;with family/visitor present;with call bell/phone within reach  OT Visit Diagnosis: Other symptoms and signs involving the nervous system (R29.898)                Time: 3545-6256 OT Time Calculation (min): 16 min Charges:  OT General Charges $OT Visit: 1 Visit OT Evaluation $OT Eval Low Complexity: 1 Low  Lou Cal, OT [facility] Pager [phone] Office [phone]   Raymondo Band 10/13/2017, 10:40 AM

## 2017-10-13 NOTE — Plan of Care (Signed)
Pt alert and oriented x4. Skin warm and dry. Respirations equal and unlabored. Pt educated on care plan. Pt verbalized understanding of stroke risk factors. Verbalized understanding of condition. Pt also verbalized sign and symptoms of a stroke. Pt ambulatory with steady gait. Symptoms improved. Denies pain. Diagnostic tests improving. Pt progressing towards discharge.

## 2017-10-13 NOTE — Progress Notes (Signed)
VASCULAR LAB PRELIMINARY  PRELIMINARY  PRELIMINARY  PRELIMINARY  Carotid duplex completed.    Preliminary report:  1-39% ICA plaquing. Vertebral artery flow is antegrade.   Daaron Dimarco, RVT 10/13/2017, 9:12 AM

## 2017-10-13 NOTE — Progress Notes (Signed)
Pt alert and oriented x4 and walking with strong and steady fait ; no s/s of distress noted

## 2017-10-15 ENCOUNTER — Telehealth: Payer: Self-pay | Admitting: *Deleted

## 2017-10-15 NOTE — Telephone Encounter (Signed)
Transition Care Management Follow-up Telephone Call  Per Discharge Summary: Admit date: 10/12/2017 Discharge date: 10/13/2017  Admitted From: Home Disposition: Home  Recommendations for Outpatient Follow-up:  1. Follow up with PCP this coming week 2. Please follow up on the following pending results: Carotid ultrasound preliminarily read as negative awaiting official results.  Home Health: No Equipment/Devices: None  Discharge Condition: Stable CODE STATUS: Full code Diet recommendation: Heart healthy  --   How have you been since you were released from the hospital? "Okay, I was a little tired the first day from not getting an asleep."   Do you understand why you were in the hospital? yes   Do you understand the discharge instructions? yes   Where were you discharged to? Home   Items Reviewed:  Medications reviewed: yes  Allergies reviewed: yes  Dietary changes reviewed: yes  Referrals reviewed: yes   Functional Questionnaire:   Activities of Daily Living (ADLs):   She states they are independent in the following: ambulation, bathing and hygiene, feeding, continence, grooming, toileting and dressing States they require assistance with the following: none   Any transportation issues/concerns?: no   Any patient concerns? Yes, patient plans to follow-up w/ Dr. Vertell Limber as well   Confirmed importance and date/time of follow-up visits scheduled yes  Provider Appointment booked with Dr. Shanon Ace 10/17/17 @ 2:15 pm  Confirmed with patient if condition begins to worsen call PCP or go to the ER.  Patient was given the office number and encouraged to call back with question or concerns.  : yes

## 2017-10-16 NOTE — Progress Notes (Signed)
Chief Complaint  Patient presents with  . Hospitalization Follow-up    Pt seen in [facility] for TIA.     HPI: Madison Erickson 77 y.o. come in for  Fu  hosp  for tia  Sx   Evaluation  .  See below. She had onset of a minor vision change in the right eye that was transitory without headache and then noticed some tingling on her right arm and perhaps her face and noticed she could not get the words out the person she was talking on the phone was said her speech was garbled.  She got scared and her husband took her to the emergency room.  By the time she got there she was improving.  She never had any weakness or vomiting. She has a remote history of migraines with right arm tingling.  Her mom had a history of hemiplegic migraine strokelike symptoms.  She states that her blood pressure has been okay but it was very high in the ED She was taking Crestor only every other day at the time. She had cardiac monitoring CT and MRA without significant acute findings. There is no history of arrhythmia or unusual palpitations.   She has a history of C-spine surgery from Dr. Vertell Limber.  On the scan there was noted a small dural nodule.  Admit date: 10/12/2017 Discharge date: 10/13/2017  Admitted From: Home Disposition: Home  Recommendations for Outpatient Follow-up:  1. Follow up with PCP this coming week 2. Please follow up on the following pending results: Carotid ultrasound preliminarily read as negative awaiting official results.  Home Health: No Equipment/Devices: None  Discharge Condition: Stable CODE STATUS: Full code Diet recommendation: Heart healthy  Brief/Interim Summary: Madison Apsey Hoodis a 77 y.o.femalewith medical history significant ofhypertension, hyperlipidemia, prediabetes, asthma, migraine, GERD, bronchiectasis (partial lobectomy), who presents with right arm and right facial numbness, difficulty speaking and vision change.  MRI and MRA of the brain did not show any areas of  stenosis or ischemia or infarction.  Her cholesterol profile was acceptable except for a low HDL.  Her hemoglobin A1c was 5.9.  She had noted blood in her urine which was discussed with her.  She says she has this frequently due to cysts and polyps in her kidneys.  Her EKG showed a sinus rhythm therefore echocardiogram was discontinued.  She was seen by PT/OT/ST and cleared for discharge.  Carotid ultrasound is preliminarily negative.  She will discharge home on aspirin 81 mg p.o. daily.  We discussed risk factor modification especially in terms of her "prediabetes".  Talked about an anti-inflammatory low glycemic index diet.  Patient has mentioned that she has gained some weight recently and we talked about weight loss at length.  Patient has reached maximal benefit of hospitalization.  Discharge diagnosis, prognosis, plans, follow-up, medications and treatments discussed with the patient(or responsible party) and is in agreement with the plans as described.  Patient is stable for discharge.  Discharge Diagnoses:  Principal Problem:   TIA (transient ischemic attack) Active Problems:   Essential hypertension   Asthma   Hyperlipidemia   GERD (gastroesophageal reflux disease)   Migraine    Discharge Instructions      Discharge Instructions    Diet - low sodium heart healthy   Complete by:  As directed    Discharge instructions   Complete by:  As directed    Continue taking Crestor 10 mg p.o. daily and start taking aspirin 81 mg daily.  See Dr. Regis Bill  in the next 1 week.   Increase activity slowly       ROS: See pertinent positives and negatives per HPI.  No current chest pain shortness of breath vision changes no gross hematuria she does have a history of bladder polyps with blood in her urine.   There is a family history of heart disease.  Past Medical History:  Diagnosis Date  . Abnormal LFTs    fatty liver on [place] MRI faay liver and hemangioma 2008 biopsy 2011  .  Asthma    allergy  . Benign tumor   . Bladder polyps   . Blood transfusion 1969  . Blood transfusion abn reaction or complication, no procedure mishap   . Cancer (HCC)    squameous cell lsft leg hx  . Colon polyp 2009   Tubulovillous adenoma   . Diverticulosis of colon   . Eczema   . Fatty liver disease, nonalcoholic    confirmed biopsy 2011 mild no fibrosis   . GERD (gastroesophageal reflux disease)   . Hematuria    urethral polyps  . HT (hammer toe)   . Hypertension   . Kidney disease    nephritis as  child. polpys, hematuria  . Lichen sclerosus    gyne care topical steroids  . Migraines    hx migraines  . Osteoarthritis   . Shortness of breath    partial lower rt lung lobectomy.  . Status post partial lobectomy of lung    for bronchiesctesis  . Urinary incontinence   . UTI (lower urinary tract infection)     Family History  Problem Relation Age of Onset  . Cancer Mother        lung  . Heart disease Mother   . Diabetes Paternal Grandfather   . Cancer Paternal Grandfather        bladder  . Mental retardation Other     Social History   Socioeconomic History  . Marital status: Married    Spouse name: Not on file  . Number of children: Not on file  . Years of education: Not on file  . Highest education level: Not on file  Occupational History  . Not on file  Social Needs  . Financial resource strain: Not on file  . Food insecurity:    Worry: Not on file    Inability: Not on file  . Transportation needs:    Medical: Not on file    Non-medical: Not on file  Tobacco Use  . Smoking status: Never Smoker  . Smokeless tobacco: Never Used  . Tobacco comment: wine q 2-3 weeks  Substance and Sexual Activity  . Alcohol use: Yes    Alcohol/week: 0.0 standard drinks    Comment: 4-5 per year   . Drug use: No  . Sexual activity: Not on file    Comment: hysterectomy  Lifestyle  . Physical activity:    Days per week: Not on file    Minutes per session: Not on  file  . Stress: Not on file  Relationships  . Social connections:    Talks on phone: Not on file    Gets together: Not on file    Attends religious service: Not on file    Active member of club or organization: Not on file    Attends meetings of clubs or organizations: Not on file    Relationship status: Not on file  Other Topics Concern  . Not on file  Social History Narrative   Retired  real estate travels a lot to [employer]   Married   Alcohol occasional social   Household of 2   Caffeine   Bereaved parent   Single story home   Bachelors degree     Outpatient Medications Prior to Visit  Medication Sig Dispense Refill  . aspirin EC 81 MG EC tablet Take 1 tablet (81 mg total) by mouth daily. 90 tablet 0  . cetirizine (ZYRTEC) 10 MG tablet Take 10 mg by mouth daily.      . clobetasol (TEMOVATE) 0.05 % cream Apply 1 application topically 2 (two) times daily.     . hydrochlorothiazide (HYDRODIURIL) 25 MG tablet TAKE 1 TABLET DAILY 90 tablet 2  . metoprolol succinate (TOPROL XL) 50 MG 24 hr tablet TAKE 1 TABLET DAILY WITH OR IMMEDIATELY FOLLOWING A MEAL 90 tablet 2  . METRONIDAZOLE, TOPICAL, 0.75 % LOTN Apply 1 application topically See admin instructions. Apply to face once a day    . rosuvastatin (CRESTOR) 10 MG tablet TAKE 1 TABLET DAILY (Patient taking differently: Take 10 mg by mouth every other day. ) 90 tablet 0  . esomeprazole (NEXIUM) 40 MG capsule TAKE 1 CAPSULE DAILY BEFORE BREAKFAST (Patient not taking: Reported on 10/17/2017) 90 capsule 1   No facility-administered medications prior to visit.      EXAM:  BP (!) 158/76 (BP Location: Right Arm, Patient Position: Sitting, Cuff Size: Normal)   Pulse 90   Temp 98.8 F (37.1 C) (Oral)   Wt 150 lb 6.4 oz (68.2 kg)   BMI 26.64 kg/m   Body mass index is 26.64 kg/m.  GENERAL: vitals reviewed and listed above, alert, oriented, appears well hydrated and in no acute distress HEENT: atraumatic, conjunctiva  clear, no  obvious abnormalities on inspection of external nose and earseoms nl  OP : no lesion edema or exudate  NECK: no obvious masses on inspection palpation  LUNGS: clear to auscultation bilaterally, no wheezes, rales or rhonchi, good air movement CV: HRRR, no clubbing cyanosis or  peripheral edema nl cap refill  MS: moves all extremities without noticeable focal  Abnormality Neurologic exam is nonfocal her speech is normal as well as thought process. PSYCH: pleasant and cooperative, no obvious depression or anxiety Lab Results  Component Value Date   WBC 9.5 10/12/2017   HGB 14.7 10/12/2017   HCT 42.4 10/12/2017   PLT 229 10/12/2017   GLUCOSE 129 (H) 10/13/2017   CHOL 119 10/13/2017   TRIG 149 10/13/2017   HDL 29 (L) 10/13/2017   LDLDIRECT 201.9 10/26/2008   LDLCALC 60 10/13/2017   ALT 21 10/12/2017   AST 23 10/12/2017   NA 143 10/13/2017   K 3.6 10/13/2017   CL 105 10/13/2017   CREATININE 0.67 10/13/2017   BUN 13 10/13/2017   CO2 27 10/13/2017   TSH 1.11 08/15/2016   INR 1.02 10/12/2017   HGBA1C 5.9 (H) 10/13/2017   BP Readings from Last 3 Encounters:  10/29/17 132/78  10/17/17 (!) 158/76  10/13/17 (!) 149/78    ASSESSMENT AND PLAN:  Discussed the following assessment and plan:  TIA (transient ischemic attack) - see text  - Plan: ECHOCARDIOGRAM COMPLETE, Ambulatory referral to Neurology  Medication management - Plan: ECHOCARDIOGRAM COMPLETE, Ambulatory referral to Neurology  Essential hypertension - Plan: ECHOCARDIOGRAM COMPLETE  Hyperlipidemia, unspecified hyperlipidemia type  Fasting hyperglycemia - lsi at this timea and will follow   Status post partial lobectomy of lung  Hospital discharge follow-up  Need for influenza vaccination - Plan:  Flu vaccine HIGH DOSE PF (Fluzone High dose), CANCELED: Flu Vaccine QUAD 6+ mos PF IM (Fluarix Quad PF)  Encounter for immunization - Plan: Flu vaccine HIGH DOSE PF  Dural arteriovenous fistula  Bladder polyps - prob  cause of   micro hem ua will follow Clinical symptoms consistent with TIA interesting that she has a remote history of migraines with similar symptoms except for the speech changes.  And her mom had a history of hemiplegic migraines. Nevertheless advised better more intensive blood pressure control stay on the aspirin statin medication we will order echocardiogram she will also attention to healthy eating and exercise. Plan neurologic referral for evaluation to make sure no other work-up advised.  No evidence of vasculitis per se. Flu shot today plan follow-up in 6 to 8 weeks or as indicated. -Patient advised to return or notify health care team  if  new concerns arise.  Patient Instructions  Stay  On  The asa and add  bp medication to  get better control.   Of bp   Control.   Goal  Close rto 120/80 as possible   Take  crestor every day .   Will be contacted about  Echocardiogram .   Then  Neurology   consult to confirm plan .  Get appt with dr Vertell Limber about the  Nodule on the dura  .   That may not be Important   ROV in   6 weeks   Or as needed   Flu vaccine today.        DASH Eating Plan DASH stands for "Dietary Approaches to Stop Hypertension." The DASH eating plan is a healthy eating plan that has been shown to reduce high blood pressure (hypertension). It may also reduce your risk for type 2 diabetes, heart disease, and stroke. The DASH eating plan may also help with weight loss. What are tips for following this plan? General guidelines  Avoid eating more than 2,300 mg (milligrams) of salt (sodium) a day. If you have hypertension, you may need to reduce your sodium intake to 1,500 mg a day.  Limit alcohol intake to no more than 1 drink a day for nonpregnant women and 2 drinks a day for men. One drink equals 12 oz of beer, 5 oz of wine, or 1 oz of hard liquor.  Work with your health care provider to maintain a healthy body weight or to lose weight. Ask what an ideal weight is for  you.  Get at least 30 minutes of exercise that causes your heart to beat faster (aerobic exercise) most days of the week. Activities may include walking, swimming, or biking.  Work with your health care provider or diet and nutrition specialist (dietitian) to adjust your eating plan to your individual calorie needs. Reading food labels  Check food labels for the amount of sodium per serving. Choose foods with less than 5 percent of the Daily Value of sodium. Generally, foods with less than 300 mg of sodium per serving fit into this eating plan.  To find whole grains, look for the word "whole" as the first word in the ingredient list. Shopping  Buy products labeled as "low-sodium" or "no salt added."  Buy fresh foods. Avoid canned foods and premade or frozen meals. Cooking  Avoid adding salt when cooking. Use salt-free seasonings or herbs instead of table salt or sea salt. Check with your health care provider or pharmacist before using salt substitutes.  Do not fry foods. Cook foods using  healthy methods such as baking, boiling, grilling, and broiling instead.  Cook with heart-healthy oils, such as olive, canola, soybean, or sunflower oil. Meal planning   Eat a balanced diet that includes: ? 5 or more servings of fruits and vegetables each day. At each meal, try to fill half of your plate with fruits and vegetables. ? Up to 6-8 servings of whole grains each day. ? Less than 6 oz of lean meat, poultry, or fish each day. A 3-oz serving of meat is about the same size as a deck of cards. One egg equals 1 oz. ? 2 servings of low-fat dairy each day. ? A serving of nuts, seeds, or beans 5 times each week. ? Heart-healthy fats. Healthy fats called Omega-3 fatty acids are found in foods such as flaxseeds and coldwater fish, like sardines, salmon, and mackerel.  Limit how much you eat of the following: ? Canned or prepackaged foods. ? Food that is high in trans fat, such as fried  foods. ? Food that is high in saturated fat, such as fatty meat. ? Sweets, desserts, sugary drinks, and other foods with added sugar. ? Full-fat dairy products.  Do not salt foods before eating.  Try to eat at least 2 vegetarian meals each week.  Eat more home-cooked food and less restaurant, buffet, and fast food.  When eating at a restaurant, ask that your food be prepared with less salt or no salt, if possible. What foods are recommended? The items listed may not be a complete list. Talk with your dietitian about what dietary choices are best for you. Grains Whole-grain or whole-wheat bread. Whole-grain or whole-wheat pasta. Brown rice. [place]. Bulgur. Whole-grain and low-sodium cereals. Pita bread. Low-fat, low-sodium crackers. Whole-wheat flour tortillas. Vegetables Fresh or frozen vegetables (raw, steamed, roasted, or grilled). Low-sodium or reduced-sodium tomato and vegetable juice. Low-sodium or reduced-sodium tomato sauce and tomato paste. Low-sodium or reduced-sodium canned vegetables. Fruits All fresh, dried, or frozen fruit. Canned fruit in natural juice (without added sugar). Meat and other protein foods Skinless chicken or [place]. Ground chicken or [place]. Pork with fat trimmed off. Fish and seafood. Egg whites. Dried beans, peas, or lentils. Unsalted nuts, nut butters, and seeds. Unsalted canned beans. Lean cuts of beef with fat trimmed off. Low-sodium, lean deli meat. Dairy Low-fat (1%) or fat-free (skim) milk. Fat-free, low-fat, or reduced-fat cheeses. Nonfat, low-sodium ricotta or cottage cheese. Low-fat or nonfat yogurt. Low-fat, low-sodium cheese. Fats and oils Soft margarine without trans fats. Vegetable oil. Low-fat, reduced-fat, or light mayonnaise and salad dressings (reduced-sodium). Canola, safflower, olive, soybean, and sunflower oils. Avocado. Seasoning and other foods Herbs. Spices. Seasoning mixes without salt. Unsalted popcorn and pretzels. Fat-free  sweets. What foods are not recommended? The items listed may not be a complete list. Talk with your dietitian about what dietary choices are best for you. Grains Baked goods made with fat, such as croissants, muffins, or some breads. Dry pasta or rice meal packs. Vegetables Creamed or fried vegetables. Vegetables in a cheese sauce. Regular canned vegetables (not low-sodium or reduced-sodium). Regular canned tomato sauce and paste (not low-sodium or reduced-sodium). Regular tomato and vegetable juice (not low-sodium or reduced-sodium). Angie Fava. Olives. Fruits Canned fruit in a light or heavy syrup. Fried fruit. Fruit in cream or butter sauce. Meat and other protein foods Fatty cuts of meat. Ribs. Fried meat. Berniece Salines. Sausage. Bologna and other processed lunch meats. Salami. Fatback. Hotdogs. Bratwurst. Salted nuts and seeds. Canned beans with added salt. Canned or smoked fish.  Whole eggs or egg yolks. Chicken or [place] with skin. Dairy Whole or 2% milk, cream, and half-and-half. Whole or full-fat cream cheese. Whole-fat or sweetened yogurt. Full-fat cheese. Nondairy creamers. Whipped toppings. Processed cheese and cheese spreads. Fats and oils Butter. Stick margarine. Lard. Shortening. Ghee. Bacon fat. Tropical oils, such as coconut, palm kernel, or palm oil. Seasoning and other foods Salted popcorn and pretzels. Onion salt, garlic salt, seasoned salt, table salt, and sea salt. Worcestershire sauce. Tartar sauce. Barbecue sauce. Teriyaki sauce. Soy sauce, including reduced-sodium. Steak sauce. Canned and packaged gravies. Fish sauce. Oyster sauce. Cocktail sauce. Horseradish that you find on the shelf. Ketchup. Mustard. Meat flavorings and tenderizers. Bouillon cubes. Hot sauce and Tabasco sauce. Premade or packaged marinades. Premade or packaged taco seasonings. Relishes. Regular salad dressings. Where to find more information:  National Heart, Lung, and [place]:  https://wilson-eaton.com/  American Heart Association: www.heart.org Summary  The DASH eating plan is a healthy eating plan that has been shown to reduce high blood pressure (hypertension). It may also reduce your risk for type 2 diabetes, heart disease, and stroke.  With the DASH eating plan, you should limit salt (sodium) intake to 2,300 mg a day. If you have hypertension, you may need to reduce your sodium intake to 1,500 mg a day.  When on the DASH eating plan, aim to eat more fresh fruits and vegetables, whole grains, lean proteins, low-fat dairy, and heart-healthy fats.  Work with your health care provider or diet and nutrition specialist (dietitian) to adjust your eating plan to your individual calorie needs. This information is not intended to replace advice given to you by your health care provider. Make sure you discuss any questions you have with your health care provider. Document Released: 12/29/2010 Document Revised: 01/03/2016 Document Reviewed: 01/03/2016 Elsevier Interactive Patient Education  2018 [place]. Karley Pho M.D.

## 2017-10-17 ENCOUNTER — Encounter: Payer: Self-pay | Admitting: Internal Medicine

## 2017-10-17 ENCOUNTER — Other Ambulatory Visit: Payer: Self-pay | Admitting: Obstetrics and Gynecology

## 2017-10-17 ENCOUNTER — Ambulatory Visit (INDEPENDENT_AMBULATORY_CARE_PROVIDER_SITE_OTHER): Payer: Medicare Other | Admitting: Internal Medicine

## 2017-10-17 VITALS — BP 158/76 | HR 90 | Temp 98.8°F | Wt 150.4 lb

## 2017-10-17 DIAGNOSIS — I671 Cerebral aneurysm, nonruptured: Secondary | ICD-10-CM

## 2017-10-17 DIAGNOSIS — Z902 Acquired absence of lung [part of]: Secondary | ICD-10-CM

## 2017-10-17 DIAGNOSIS — E785 Hyperlipidemia, unspecified: Secondary | ICD-10-CM

## 2017-10-17 DIAGNOSIS — R7301 Impaired fasting glucose: Secondary | ICD-10-CM | POA: Diagnosis not present

## 2017-10-17 DIAGNOSIS — Z79899 Other long term (current) drug therapy: Secondary | ICD-10-CM

## 2017-10-17 DIAGNOSIS — D414 Neoplasm of uncertain behavior of bladder: Secondary | ICD-10-CM | POA: Diagnosis not present

## 2017-10-17 DIAGNOSIS — Z23 Encounter for immunization: Secondary | ICD-10-CM | POA: Diagnosis not present

## 2017-10-17 DIAGNOSIS — G459 Transient cerebral ischemic attack, unspecified: Secondary | ICD-10-CM | POA: Diagnosis not present

## 2017-10-17 DIAGNOSIS — I10 Essential (primary) hypertension: Secondary | ICD-10-CM

## 2017-10-17 DIAGNOSIS — Z09 Encounter for follow-up examination after completed treatment for conditions other than malignant neoplasm: Secondary | ICD-10-CM | POA: Diagnosis not present

## 2017-10-17 DIAGNOSIS — Z1231 Encounter for screening mammogram for malignant neoplasm of breast: Secondary | ICD-10-CM

## 2017-10-17 MED ORDER — AMLODIPINE BESYLATE 5 MG PO TABS: 5.0000 mg | ORAL_TABLET | Freq: Every day | ORAL | 1 refills | Status: DC

## 2017-10-17 NOTE — Patient Instructions (Addendum)
Stay  On  The asa and add  bp medication to  get better control.   Of bp   Control.   Goal  Close rto 120/80 as possible   Take  crestor every day .   Will be contacted about  Echocardiogram .   Then  Neurology   consult to confirm plan .  Get appt with dr Vertell Limber about the  Nodule on the dura  .   That may not be Important   ROV in   6 weeks   Or as needed   Flu vaccine today.        DASH Eating Plan DASH stands for "Dietary Approaches to Stop Hypertension." The DASH eating plan is a healthy eating plan that has been shown to reduce high blood pressure (hypertension). It may also reduce your risk for type 2 diabetes, heart disease, and stroke. The DASH eating plan may also help with weight loss. What are tips for following this plan? General guidelines  Avoid eating more than 2,300 mg (milligrams) of salt (sodium) a day. If you have hypertension, you may need to reduce your sodium intake to 1,500 mg a day.  Limit alcohol intake to no more than 1 drink a day for nonpregnant women and 2 drinks a day for men. One drink equals 12 oz of beer, 5 oz of wine, or 1 oz of hard liquor.  Work with your health care provider to maintain a healthy body weight or to lose weight. Ask what an ideal weight is for you.  Get at least 30 minutes of exercise that causes your heart to beat faster (aerobic exercise) most days of the week. Activities may include walking, swimming, or biking.  Work with your health care provider or diet and nutrition specialist (dietitian) to adjust your eating plan to your individual calorie needs. Reading food labels  Check food labels for the amount of sodium per serving. Choose foods with less than 5 percent of the Daily Value of sodium. Generally, foods with less than 300 mg of sodium per serving fit into this eating plan.  To find whole grains, look for the word "whole" as the first word in the ingredient list. Shopping  Buy products labeled as "low-sodium" or "no  salt added."  Buy fresh foods. Avoid canned foods and premade or frozen meals. Cooking  Avoid adding salt when cooking. Use salt-free seasonings or herbs instead of table salt or sea salt. Check with your health care provider or pharmacist before using salt substitutes.  Do not fry foods. Cook foods using healthy methods such as baking, boiling, grilling, and broiling instead.  Cook with heart-healthy oils, such as olive, canola, soybean, or sunflower oil. Meal planning   Eat a balanced diet that includes: ? 5 or more servings of fruits and vegetables each day. At each meal, try to fill half of your plate with fruits and vegetables. ? Up to 6-8 servings of whole grains each day. ? Less than 6 oz of lean meat, poultry, or fish each day. A 3-oz serving of meat is about the same size as a deck of cards. One egg equals 1 oz. ? 2 servings of low-fat dairy each day. ? A serving of nuts, seeds, or beans 5 times each week. ? Heart-healthy fats. Healthy fats called Omega-3 fatty acids are found in foods such as flaxseeds and coldwater fish, like sardines, salmon, and mackerel.  Limit how much you eat of the following: ? Canned or prepackaged foods. ?  Food that is high in trans fat, such as fried foods. ? Food that is high in saturated fat, such as fatty meat. ? Sweets, desserts, sugary drinks, and other foods with added sugar. ? Full-fat dairy products.  Do not salt foods before eating.  Try to eat at least 2 vegetarian meals each week.  Eat more home-cooked food and less restaurant, buffet, and fast food.  When eating at a restaurant, ask that your food be prepared with less salt or no salt, if possible. What foods are recommended? The items listed may not be a complete list. Talk with your dietitian about what dietary choices are best for you. Grains Whole-grain or whole-wheat bread. Whole-grain or whole-wheat pasta. Brown rice. Modena Morrow. Bulgur. Whole-grain and low-sodium  cereals. Pita bread. Low-fat, low-sodium crackers. Whole-wheat flour tortillas. Vegetables Fresh or frozen vegetables (raw, steamed, roasted, or grilled). Low-sodium or reduced-sodium tomato and vegetable juice. Low-sodium or reduced-sodium tomato sauce and tomato paste. Low-sodium or reduced-sodium canned vegetables. Fruits All fresh, dried, or frozen fruit. Canned fruit in natural juice (without added sugar). Meat and other protein foods Skinless chicken or [place]. Ground chicken or [place]. Pork with fat trimmed off. Fish and seafood. Egg whites. Dried beans, peas, or lentils. Unsalted nuts, nut butters, and seeds. Unsalted canned beans. Lean cuts of beef with fat trimmed off. Low-sodium, lean deli meat. Dairy Low-fat (1%) or fat-free (skim) milk. Fat-free, low-fat, or reduced-fat cheeses. Nonfat, low-sodium ricotta or cottage cheese. Low-fat or nonfat yogurt. Low-fat, low-sodium cheese. Fats and oils Soft margarine without trans fats. Vegetable oil. Low-fat, reduced-fat, or light mayonnaise and salad dressings (reduced-sodium). Canola, safflower, olive, soybean, and sunflower oils. Avocado. Seasoning and other foods Herbs. Spices. Seasoning mixes without salt. Unsalted popcorn and pretzels. Fat-free sweets. What foods are not recommended? The items listed may not be a complete list. Talk with your dietitian about what dietary choices are best for you. Grains Baked goods made with fat, such as croissants, muffins, or some breads. Dry pasta or rice meal packs. Vegetables Creamed or fried vegetables. Vegetables in a cheese sauce. Regular canned vegetables (not low-sodium or reduced-sodium). Regular canned tomato sauce and paste (not low-sodium or reduced-sodium). Regular tomato and vegetable juice (not low-sodium or reduced-sodium). Angie Fava. Olives. Fruits Canned fruit in a light or heavy syrup. Fried fruit. Fruit in cream or butter sauce. Meat and other protein foods Fatty cuts of meat. Ribs.  Fried meat. Berniece Salines. Sausage. Bologna and other processed lunch meats. Salami. Fatback. Hotdogs. Bratwurst. Salted nuts and seeds. Canned beans with added salt. Canned or smoked fish. Whole eggs or egg yolks. Chicken or [place] with skin. Dairy Whole or 2% milk, cream, and half-and-half. Whole or full-fat cream cheese. Whole-fat or sweetened yogurt. Full-fat cheese. Nondairy creamers. Whipped toppings. Processed cheese and cheese spreads. Fats and oils Butter. Stick margarine. Lard. Shortening. Ghee. Bacon fat. Tropical oils, such as coconut, palm kernel, or palm oil. Seasoning and other foods Salted popcorn and pretzels. Onion salt, garlic salt, seasoned salt, table salt, and sea salt. Worcestershire sauce. Tartar sauce. Barbecue sauce. Teriyaki sauce. Soy sauce, including reduced-sodium. Steak sauce. Canned and packaged gravies. Fish sauce. Oyster sauce. Cocktail sauce. Horseradish that you find on the shelf. Ketchup. Mustard. Meat flavorings and tenderizers. Bouillon cubes. Hot sauce and Tabasco sauce. Premade or packaged marinades. Premade or packaged taco seasonings. Relishes. Regular salad dressings. Where to find more information:  National Heart, Lung, and [place]: https://wilson-eaton.com/  American Heart Association: www.heart.org Summary  The DASH eating plan is  a healthy eating plan that has been shown to reduce high blood pressure (hypertension). It may also reduce your risk for type 2 diabetes, heart disease, and stroke.  With the DASH eating plan, you should limit salt (sodium) intake to 2,300 mg a day. If you have hypertension, you may need to reduce your sodium intake to 1,500 mg a day.  When on the DASH eating plan, aim to eat more fresh fruits and vegetables, whole grains, lean proteins, low-fat dairy, and heart-healthy fats.  Work with your health care provider or diet and nutrition specialist (dietitian) to adjust your eating plan to your individual calorie needs. This  information is not intended to replace advice given to you by your health care provider. Make sure you discuss any questions you have with your health care provider. Document Released: 12/29/2010 Document Revised: 01/03/2016 Document Reviewed: 01/03/2016 Elsevier Interactive Patient Education  [employer].

## 2017-10-18 ENCOUNTER — Ambulatory Visit (HOSPITAL_COMMUNITY): Payer: Medicare Other | Attending: Internal Medicine

## 2017-10-18 ENCOUNTER — Other Ambulatory Visit: Payer: Self-pay

## 2017-10-18 ENCOUNTER — Encounter: Payer: Self-pay | Admitting: Neurology

## 2017-10-18 DIAGNOSIS — Z79899 Other long term (current) drug therapy: Secondary | ICD-10-CM | POA: Insufficient documentation

## 2017-10-18 DIAGNOSIS — I509 Heart failure, unspecified: Secondary | ICD-10-CM | POA: Diagnosis not present

## 2017-10-18 DIAGNOSIS — I110 Hypertensive heart disease with heart failure: Secondary | ICD-10-CM | POA: Insufficient documentation

## 2017-10-18 DIAGNOSIS — I10 Essential (primary) hypertension: Secondary | ICD-10-CM

## 2017-10-18 DIAGNOSIS — G459 Transient cerebral ischemic attack, unspecified: Secondary | ICD-10-CM | POA: Diagnosis not present

## 2017-10-18 DIAGNOSIS — I340 Nonrheumatic mitral (valve) insufficiency: Secondary | ICD-10-CM | POA: Insufficient documentation

## 2017-10-18 DIAGNOSIS — E785 Hyperlipidemia, unspecified: Secondary | ICD-10-CM | POA: Insufficient documentation

## 2017-10-28 NOTE — Progress Notes (Signed)
NEUROLOGY CONSULTATION NOTE  [name] MRN: [id number] DOB: May 17, 1940  Referring provider: Shanon Ace, MD Primary care provider: Shanon Ace, MD  Reason for consult:  Transient ischemic attack  HISTORY OF PRESENT ILLNESS: Madison Erickson is a 77 year old right-handed female hypertension, hyperlipidemia, prediabetes, migraine and bronchiectasis s/p partial lobectomy who presents for transient ischemic attack.  She is accompanied by her husband who supplements history.  History supplemented by hospital and referring provider's notes.  She was admitted to [facility] from 10/12/17 to 10/13/17 after presenting with visual disturbance and right sided numbness.  At first, she noted a small prism in the right side of her vision, which caused her some difficulty when reading.  She had trouble with talking and then developed right facial and hand numbness.  She had some word-finding difficulty which made her speech sound garbled.  Symptoms lasted 15 minutes.  No associated headache.  MRI of brain was personally reviewed and moderate chronic small vessel ischemic changes and 15 mm left frontal dural nodule but did not demonstrate any acute infarct or bleeding.  MRA of head revealed no large vessel stenosis or occlusion.  Carotid ultrasound showed no hemodynamically significant ICA stenosis.  EKG and telemetry showed sinus rhythm and no atrial fibrillation.  Echocardiogram was not performed.  LDL was 60.  Hgb A1c was 5.9.  She was discharged on ASA 81mg  daily and continued on Crestor 10mg  daily.  She had an outpatient echocardiogram on 10/18/17, which showed EF 55-60% with grade 1 diastolic dysfunction.  She has remote history of migraines 20s to 39s.  Her migraines preceded by right facial and hand numbness.  One time, it was associated with speech difficulty.  Associated headaches were not too severe.  PAST MEDICAL HISTORY: Past Medical History:  Diagnosis Date  . Abnormal LFTs    fatty  liver on [place] MRI faay liver and hemangioma 2008 biopsy 2011  . Asthma    allergy  . Benign tumor   . Bladder polyps   . Blood transfusion 1969  . Blood transfusion abn reaction or complication, no procedure mishap   . Cancer (HCC)    squameous cell lsft leg hx  . Colon polyp 2009   Tubulovillous adenoma   . Diverticulosis of colon   . Eczema   . Fatty liver disease, nonalcoholic    confirmed biopsy 2011 mild no fibrosis   . GERD (gastroesophageal reflux disease)   . Hematuria    urethral polyps  . HT (hammer toe)   . Hypertension   . Kidney disease    nephritis as  child. polpys, hematuria  . Lichen sclerosus    gyne care topical steroids  . Migraines    hx migraines  . Osteoarthritis   . Shortness of breath    partial lower rt lung lobectomy.  . Status post partial lobectomy of lung    for bronchiesctesis  . Urinary incontinence   . UTI (lower urinary tract infection)     PAST SURGICAL HISTORY: Past Surgical History:  Procedure Laterality Date  . APPENDECTOMY    . caesarean section     X 3  . KNEE ARTHROSCOPY  12   rt  . left knee surgery on torn mansicus  11  . LUMBAR LAMINECTOMY/DECOMPRESSION MICRODISCECTOMY  03/23/2011   Procedure: LUMBAR LAMINECTOMY/DECOMPRESSION MICRODISCECTOMY 1 LEVEL;  Surgeon: Peggyann Shoals, MD;  Location: [facility];  Service: Neurosurgery;  Laterality: Left;  Left Lumbar four-five laminectomy and microdiscectomy  .  PERCUTANEOUS LIVER BIOPSY  2011   fatty liver no fibrosis  . rt knee surgery  2012  . rt lung lobectomy     for bronchiectasis  . VAGINAL HYSTERECTOMY      MEDICATIONS: Current Outpatient Medications on File Prior to Visit  Medication Sig Dispense Refill  . amLODipine (NORVASC) 5 MG tablet Take 1 tablet (5 mg total) by mouth daily. 90 tablet 1  . aspirin EC 81 MG EC tablet Take 1 tablet (81 mg total) by mouth daily. 90 tablet 0  . cetirizine (ZYRTEC) 10 MG tablet Take 10 mg by mouth daily.      . clobetasol (TEMOVATE)  0.05 % cream Apply 1 application topically 2 (two) times daily.     . hydrochlorothiazide (HYDRODIURIL) 25 MG tablet TAKE 1 TABLET DAILY 90 tablet 2  . metoprolol succinate (TOPROL XL) 50 MG 24 hr tablet TAKE 1 TABLET DAILY WITH OR IMMEDIATELY FOLLOWING A MEAL 90 tablet 2  . METRONIDAZOLE, TOPICAL, 0.75 % LOTN Apply 1 application topically See admin instructions. Apply to face once a day    . rosuvastatin (CRESTOR) 10 MG tablet TAKE 1 TABLET DAILY (Patient taking differently: Take 10 mg by mouth every other day. ) 90 tablet 0   No current facility-administered medications on file prior to visit.     ALLERGIES: Allergies  Allergen Reactions  . Atorvastatin Other (See Comments)    Muscle aches  . Sulfamethoxazole Other (See Comments)    Could never take this because it negatively affected her "white cells and kidneys" (had Bright's Disese)  . Tape Other (See Comments)    Can tolerate paper tape; is sensitive    FAMILY HISTORY: Family History  Problem Relation Age of Onset  . Cancer Mother        lung  . Heart disease Mother   . Diabetes Paternal Grandfather   . Cancer Paternal Grandfather        bladder  . Mental retardation Other    SOCIAL HISTORY: Social History   Socioeconomic History  . Marital status: Married    Spouse name: Not on file  . Number of children: Not on file  . Years of education: Not on file  . Highest education level: Not on file  Occupational History  . Not on file  Social Needs  . Financial resource strain: Not on file  . Food insecurity:    Worry: Not on file    Inability: Not on file  . Transportation needs:    Medical: Not on file    Non-medical: Not on file  Tobacco Use  . Smoking status: Never Smoker  . Smokeless tobacco: Never Used  . Tobacco comment: wine q 2-3 weeks  Substance and Sexual Activity  . Alcohol use: Yes    Alcohol/week: 0.0 standard drinks    Comment: 4-5 per year   . Drug use: No  . Sexual activity: Not on file     Comment: hysterectomy  Lifestyle  . Physical activity:    Days per week: Not on file    Minutes per session: Not on file  . Stress: Not on file  Relationships  . Social connections:    Talks on phone: Not on file    Gets together: Not on file    Attends religious service: Not on file    Active member of club or organization: Not on file    Attends meetings of clubs or organizations: Not on file    Relationship status: Not  on file  . Intimate partner violence:    Fear of current or ex partner: Not on file    Emotionally abused: Not on file    Physically abused: Not on file    Forced sexual activity: Not on file  Other Topics Concern  . 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[employer]   Married   Alcohol occasional social   Household of 2   Caffeine   Bereaved parent    REVIEW OF SYSTEMS: Constitutional: No fevers, chills, or sweats, no generalized fatigue, change in appetite Eyes: No visual changes, double vision, eye pain Ear, nose and throat: No hearing loss, ear pain, nasal congestion, sore throat Cardiovascular: No chest pain, palpitations Respiratory:  No shortness of breath at rest or with exertion, wheezes GastrointestinaI: No nausea, vomiting, diarrhea, abdominal pain, fecal incontinence Genitourinary:  No dysuria, urinary retention or frequency Musculoskeletal:  No neck pain, back pain Integumentary: No rash, pruritus, skin lesions Neurological: as above Psychiatric: No depression, insomnia, anxiety Endocrine: No palpitations, fatigue, diaphoresis, mood swings, change in appetite, change in weight, increased thirst Hematologic/Lymphatic:  No purpura, petechiae. Allergic/Immunologic: no itchy/runny eyes, nasal congestion, recent allergic reactions, rashes  PHYSICAL EXAM: Blood pressure 132/78, pulse 78, height 5' 2.5" (1.588 m), weight 149 lb (67.6 kg), SpO2 98 %. General: No acute distress.  Patient appears well-groomed.    Head:  Normocephalic/atraumatic Eyes:  fundi examined but not visualized Neck: supple, no paraspinal tenderness, full range of motion Back: No paraspinal tenderness Heart: regular rate and rhythm Lungs: Clear to auscultation bilaterally. Vascular: No carotid bruits. Neurological Exam: Mental status: alert and oriented to person, place, and time, recent and remote memory intact, fund of knowledge intact, attention and concentration intact, speech fluent and not dysarthric, language intact. Cranial nerves: CN I: not tested CN II: pupils equal, round and reactive to light, visual fields intact CN III, IV, VI:  full range of motion, no nystagmus, no ptosis CN V: facial sensation intact CN VII: upper and lower face symmetric CN VIII: hearing intact CN IX, X: gag intact, uvula midline CN XI: sternocleidomastoid and trapezius muscles intact CN XII: tongue midline Bulk & Tone: normal, no fasciculations. Motor:  5/5 throughout  Sensation:  Pinprick and vibration sensation intact. Deep Tendon Reflexes:  2+ throughout, toes downgoing.  Finger to nose testing:  Without dysmetria.  Heel to shin:  Without dysmetria.  Gait:  Normal station and stride.  Romberg negative.  IMPRESSION: 1.  Questionable transient ischemic attack vs migraine with aura.  Symptoms similar to her previous migraines from decades ago.  However, given her age and stroke risk factors, I would err on side of caution. 2.  Dural mass on brain MRI.  Benign, likely meningioma vs calcification (MRI with contrast would be able to tell for sure).  3.  HTN 4.  Hyperlipidemia  PLAN: 1.  Continue aspirin 81mg  daily for secondary stroke prevention 2.  Continue Crestor 10mg  daily (LDL at goal of less than 70) 3.  Continue blood pressure control 4.  Mediterranean diet and exercise 5.  Follow up in 4 months.  Thank you for allowing me to take part in the care of this patient.  Metta Clines, DO  CC: Shanon Ace, MD

## 2017-10-29 ENCOUNTER — Other Ambulatory Visit: Payer: Self-pay

## 2017-10-29 ENCOUNTER — Ambulatory Visit (INDEPENDENT_AMBULATORY_CARE_PROVIDER_SITE_OTHER): Payer: Medicare Other | Admitting: Neurology

## 2017-10-29 ENCOUNTER — Encounter: Payer: Self-pay | Admitting: Neurology

## 2017-10-29 VITALS — BP 132/78 | HR 78 | Ht 62.5 in | Wt 149.0 lb

## 2017-10-29 DIAGNOSIS — G459 Transient cerebral ischemic attack, unspecified: Secondary | ICD-10-CM | POA: Diagnosis not present

## 2017-10-29 DIAGNOSIS — I10 Essential (primary) hypertension: Secondary | ICD-10-CM | POA: Diagnosis not present

## 2017-10-29 DIAGNOSIS — E785 Hyperlipidemia, unspecified: Secondary | ICD-10-CM

## 2017-10-29 NOTE — Patient Instructions (Addendum)
The event could have been a migraine, but we much err on the side of caution and assume it was a TIA 1.  Continue aspirin 81mg  daily.  I would postpone all non-urgent procedures requiring stopping aspirin to after 3 months. 2.  Continue Crestor 10mg  daily 3.  Continue blood pressure control 4.  Follow Mediterranean diet 5.  Exercise 6.  Follow up in 4 months.

## 2017-11-15 ENCOUNTER — Other Ambulatory Visit: Payer: Self-pay | Admitting: Internal Medicine

## 2017-11-19 ENCOUNTER — Ambulatory Visit
Admission: RE | Admit: 2017-11-19 | Discharge: 2017-11-19 | Disposition: A | Discharge status: Home / Self Care | Payer: Medicare Other | Source: Ambulatory Visit | Attending: Obstetrics and Gynecology | Admitting: Obstetrics and Gynecology

## 2017-11-19 DIAGNOSIS — Z1231 Encounter for screening mammogram for malignant neoplasm of breast: Secondary | ICD-10-CM | POA: Diagnosis not present

## 2018-02-03 ENCOUNTER — Other Ambulatory Visit: Payer: Self-pay | Admitting: Internal Medicine

## 2018-02-11 ENCOUNTER — Other Ambulatory Visit: Payer: Self-pay | Admitting: Internal Medicine

## 2018-02-11 MED ORDER — METOPROLOL SUCCINATE ER 50 MG PO TB24: ORAL_TABLET | ORAL | 0 refills | Status: DC

## 2018-02-11 NOTE — Telephone Encounter (Signed)
Copied from [place] [id number]. Topic: Quick Communication - See Telephone Encounter >> Feb 11, 2018 11:26 AM Ivar Drape wrote: CRM for notification. See Telephone encounter for: 02/11/18. Patient stated that [facility] have not sent her metoprolol succinate (TOPROL XL) 50 MG 24 hr tablet medication and she is leaving town tomorrow and only have two tablets left and she needs the medication.  She wants to know if the prescription can be sent to [facility] on [facility], and when she comes back into town she can get things straightened out with [employer].

## 2018-02-15 ENCOUNTER — Other Ambulatory Visit: Payer: Self-pay | Admitting: Internal Medicine

## 2018-03-04 NOTE — Progress Notes (Signed)
NEUROLOGY FOLLOW UP OFFICE NOTE  Madison Erickson [id number]  HISTORY OF PRESENT ILLNESS: Madison Erickson is a 78 year old right-handed Caucasian woman with hypertension, hyperlipidemia, prediabetes, migraine and bronchiectasis status post partial lobectomy who follows up for transient ischemic attack.  She is accompanied by her husband who supplements history.  UPDATE:  Current medications: Aspirin 81 mg, Crestor 10 mg, Norvasc, HCTZ  She has not had any other events.  HISTORY: She was admitted to [facility] from 10/12/17 to 10/13/17 after presenting with visual disturbance and right sided numbness.  At first, she noted a small prism in the right side of her vision, which caused her some difficulty when reading.  She had trouble with talking and then developed right facial and hand numbness.  She had some word-finding difficulty which made her speech sound garbled.  Symptoms lasted 15 minutes.  No associated headache.  MRI of brain was personally reviewed and moderate chronic small vessel ischemic changes and 15 mm left frontal dural nodule but did not demonstrate any acute infarct or bleeding.  MRA of head revealed no large vessel stenosis or occlusion.  Carotid ultrasound showed no hemodynamically significant ICA stenosis.  EKG and telemetry showed sinus rhythm and no atrial fibrillation.  Echocardiogram was not performed.  LDL was 60.  Hgb A1c was 5.9.  She was discharged on ASA 81mg  daily and continued on Crestor 10mg  daily.  She had an outpatient echocardiogram on 10/18/17, which showed EF 55-60% with grade 1 diastolic dysfunction.  She has remote history of migraines 20s to 8s.  Her migraines preceded by right facial and hand numbness.  One time, it was associated with speech difficulty.  Associated headaches were not too severe.  PAST MEDICAL HISTORY: Past Medical History:  Diagnosis Date  . Abnormal LFTs    fatty liver on [place] MRI faay liver and hemangioma 2008 biopsy 2011  . Asthma      allergy  . Benign tumor   . Bladder polyps   . Blood transfusion 1969  . Blood transfusion abn reaction or complication, no procedure mishap   . Cancer (HCC)    squameous cell lsft leg hx  . Colon polyp 2009   Tubulovillous adenoma   . Diverticulosis of colon   . Eczema   . Fatty liver disease, nonalcoholic    confirmed biopsy 2011 mild no fibrosis   . GERD (gastroesophageal reflux disease)   . Hematuria    urethral polyps  . HT (hammer toe)   . Hypertension   . Kidney disease    nephritis as  child. polpys, hematuria  . Lichen sclerosus    gyne care topical steroids  . Migraines    hx migraines  . Osteoarthritis   . Shortness of breath    partial lower rt lung lobectomy.  . Status post partial lobectomy of lung    for bronchiesctesis  . Urinary incontinence   . UTI (lower urinary tract infection)     MEDICATIONS: Current Outpatient Medications on File Prior to Visit  Medication Sig Dispense Refill  . amLODipine (NORVASC) 5 MG tablet Take 1 tablet (5 mg total) by mouth daily. 90 tablet 1  . aspirin EC 81 MG EC tablet Take 1 tablet (81 mg total) by mouth daily. 90 tablet 0  . cetirizine (ZYRTEC) 10 MG tablet Take 10 mg by mouth daily.      . clobetasol (TEMOVATE) 0.05 % cream Apply 1 application topically 2 (two) times daily.     [name]  hydrochlorothiazide (HYDRODIURIL) 25 MG tablet TAKE 1 TABLET DAILY 90 tablet 0  . metoprolol succinate (TOPROL XL) 50 MG 24 hr tablet TAKE 1 TABLET DAILY WITH OR IMMEDIATELY FOLLOWING A MEAL 90 tablet 0  . METRONIDAZOLE, TOPICAL, 0.75 % LOTN Apply 1 application topically See admin instructions. Apply to face once a day    . rosuvastatin (CRESTOR) 10 MG tablet TAKE 1 TABLET DAILY   (WILL NEED ANNUAL VISIT BEFORE FURTHER REFILLS SENT) 90 tablet 0   No current facility-administered medications on file prior to visit.     ALLERGIES: Allergies  Allergen Reactions  . Atorvastatin Other (See Comments)    Muscle aches  . Sulfamethoxazole  Other (See Comments)    Could never take this because it negatively affected her "white cells and kidneys" (had Bright's Disese)  . Tape Other (See Comments)    Can tolerate paper tape; is sensitive    FAMILY HISTORY: Family History  Problem Relation Age of Onset  . Cancer Mother        lung  . Heart disease Mother   . Diabetes Paternal Grandfather   . Cancer Paternal Grandfather        bladder  . Mental retardation Other   . Breast cancer Neg Hx    SOCIAL HISTORY: Social History   Socioeconomic History  . Marital status: Married    Spouse name: Not on file  . Number of children: Not on file  . Years of education: Not on file  . Highest education level: Not on file  Occupational History  . Not on file  Social Needs  . Financial resource strain: Not on file  . Food insecurity:    Worry: Not on file    Inability: Not on file  . Transportation needs:    Medical: Not on file    Non-medical: Not on file  Tobacco Use  . Smoking status: Never Smoker  . Smokeless tobacco: Never Used  . Tobacco comment: wine q 2-3 weeks  Substance and Sexual Activity  . Alcohol use: Yes    Alcohol/week: 0.0 standard drinks    Comment: 4-5 per year   . Drug use: No  . Sexual activity: Not on file    Comment: hysterectomy  Lifestyle  . Physical activity:    Days per week: Not on file    Minutes per session: Not on file  . Stress: Not on file  Relationships  . Social connections:    Talks on phone: Not on file    Gets together: Not on file    Attends religious service: Not on file    Active member of club or organization: Not on file    Attends meetings of clubs or organizations: Not on file    Relationship status: Not on file  . Intimate partner violence:    Fear of current or ex partner: Not on file    Emotionally abused: Not on file    Physically abused: Not on file    Forced sexual activity: Not on file  Other Topics Concern  . 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[employer]   Married   Alcohol occasional social   Household of 2   Caffeine   Bereaved parent   Single story home   Bachelors degree     REVIEW OF SYSTEMS: Constitutional: No fevers, chills, or sweats, no generalized fatigue, change in appetite Eyes: No visual changes, double vision, eye  pain Ear, nose and throat: No hearing loss, ear pain, nasal congestion, sore throat Cardiovascular: No chest pain, palpitations Respiratory:  No shortness of breath at rest or with exertion, wheezes GastrointestinaI: No nausea, vomiting, diarrhea, abdominal pain, fecal incontinence Genitourinary:  No dysuria, urinary retention or frequency Musculoskeletal:  No neck pain, back pain Integumentary: No rash, pruritus, skin lesions Neurological: as above Psychiatric: No depression, insomnia, anxiety Endocrine: No palpitations, fatigue, diaphoresis, mood swings, change in appetite, change in weight, increased thirst Hematologic/Lymphatic:  No purpura, petechiae. Allergic/Immunologic: no itchy/runny eyes, nasal congestion, recent allergic reactions, rashes  PHYSICAL EXAM: Blood pressure (!) 104/56, pulse 73, height 5' 2.5" (1.588 m), weight 155 lb (70.3 kg), SpO2 93 %. General: No acute distress.  Patient appears well-groomed.   Head:  Normocephalic/atraumatic Eyes:  Fundi examined but not visualized Neck: supple, no paraspinal tenderness, full range of motion Heart:  Regular rate and rhythm Lungs:  Clear to auscultation bilaterally Back: No paraspinal tenderness Neurological Exam: alert and oriented to person, place, and time. Attention span and concentration intact, recent and remote memory intact, fund of knowledge intact.  Speech fluent and not dysarthric, language intact.  CN II-XII intact. Bulk and tone normal, muscle strength 5/5 throughout.  Sensation to light touch, temperature and vibration intact.  Deep tendon reflexes 2+ throughout, toes downgoing.  Finger  to nose and heel to shin testing intact.  Gait normal, Romberg negative.  IMPRESSION: 1.  Questionable transient ischemic attack versus migraine with aura.  Symptoms similar to her previous migraine from decades ago.  However, given her age and stroke risk factors, I would err on the side of caution and treat for secondary stroke prevention. 2.  Dural mass on brain MRI.  Benign, likely meningioma versus calcification 3.  Hypertension 4.  Hyperlipidemia  PLAN: 1.  Aspirin 81 mg daily for secondary stroke prevention 2.  Crestor 10 mg daily (LDL goal less than 70) 3.  Blood pressure control 4.  Mediterranean diet and exercise 5.  Repeat MRI of brain with and without contrast in late September-early October to follow up on meningioma 6.  Follow up after repeat MRI  20 minutes spent face to face with patient, over 50% spent discussing management.  Metta Clines, DO  CC: Shanon Ace, MD

## 2018-03-06 ENCOUNTER — Ambulatory Visit (INDEPENDENT_AMBULATORY_CARE_PROVIDER_SITE_OTHER): Payer: Medicare Other | Admitting: Neurology

## 2018-03-06 ENCOUNTER — Encounter: Payer: Self-pay | Admitting: Neurology

## 2018-03-06 VITALS — BP 104/56 | HR 73 | Ht 62.5 in | Wt 155.0 lb

## 2018-03-06 DIAGNOSIS — G459 Transient cerebral ischemic attack, unspecified: Secondary | ICD-10-CM

## 2018-03-06 DIAGNOSIS — D320 Benign neoplasm of cerebral meninges: Secondary | ICD-10-CM | POA: Diagnosis not present

## 2018-03-06 NOTE — Patient Instructions (Addendum)
1.  Continue aspirin 81mg  daily and Crestor 2.  Continue blood pressure control 3.  Repeat MRI of brain with and without contrast in late September to follow up on probable meningioma (benign) 4. Follow up after repeat MRI  We have sent a referral to [facility] for your MRI and they will call you directly to schedule your appt. Please schedule in September when they call. They are located at [place]. If you need to contact them directly please call [phone].

## 2018-04-04 ENCOUNTER — Other Ambulatory Visit: Payer: Self-pay | Admitting: Internal Medicine

## 2018-04-04 MED ORDER — METOPROLOL SUCCINATE ER 50 MG PO TB24: ORAL_TABLET | ORAL | 0 refills | Status: DC

## 2018-04-04 MED ORDER — AMLODIPINE BESYLATE 5 MG PO TABS: 5.0000 mg | ORAL_TABLET | Freq: Every day | ORAL | 0 refills | Status: DC

## 2018-04-04 NOTE — Telephone Encounter (Signed)
Copied from [place] [id number]. Topic: Quick Communication - Rx Refill/Question >> Apr 04, 2018 11:58 AM Loma Boston wrote: [place], [place] [phone] (Phone) [phone] (Fax)  Please use this mail order vs as is coded for [facility]  amLODipine (NORVASC) 5 MG tablet /metoprolol succinate (TOPROL XL) 50 MG 24 hr tablet refills on both these meds for the 90 days but not thru [facility] do the Mail order

## 2018-04-11 DIAGNOSIS — J209 Acute bronchitis, unspecified: Secondary | ICD-10-CM | POA: Diagnosis not present

## 2018-04-11 DIAGNOSIS — Z6827 Body mass index (BMI) 27.0-27.9, adult: Secondary | ICD-10-CM | POA: Diagnosis not present

## 2018-04-11 DIAGNOSIS — J479 Bronchiectasis, uncomplicated: Secondary | ICD-10-CM | POA: Diagnosis not present

## 2018-04-11 DIAGNOSIS — I10 Essential (primary) hypertension: Secondary | ICD-10-CM | POA: Diagnosis not present

## 2018-04-15 ENCOUNTER — Telehealth: Payer: Self-pay

## 2018-04-15 NOTE — Telephone Encounter (Signed)
Copied from [place] [id number]. Topic: General - Inquiry >> Apr 15, 2018 11:26 AM Ahmed Prima L wrote: Reason for CRM: patient states she was just diagnosed with bronchitis while she was [place] the beach. She said she would like to speak with the nurse.

## 2018-04-15 NOTE — Telephone Encounter (Signed)
Spoke with pt she said that she got doxycyline and has been taking predinsone pt states she is feeling better since going to urgent care just has lots of runny nose and drainage

## 2018-05-03 ENCOUNTER — Ambulatory Visit: Payer: Self-pay | Admitting: Internal Medicine

## 2018-05-03 NOTE — Telephone Encounter (Signed)
Pt called in c/o burning with urination and a tinge of blood on the toilet paper.   She has a history of UTIs so this is common for her per pt.  Having frequency too.    See notes.  Since the office is closed due to the Good Friday holiday I have referred her to the urgent care.   She mentioned,  "I have Wanda's cell phone number I may just call her".   "I really don't want to go to an urgent care due to my health history".  "I also have Cathy Richardson's number".   "I think I'll call her first and see if she will call me in an antibiotic".   "If she can't then I will call Mariann Laster".      Reason for Disposition . [1] Painful urination AND [2] EITHER frequency or urgency AND [3] has on-call doctor  Answer Assessment - Initial Assessment Questions 1. SEVERITY: "How bad is the pain?"  (e.g., Scale 1-10; mild, moderate, or severe)   - MILD (1-3): complains slightly about urination hurting   - MODERATE (4-7): interferes with normal activities     - SEVERE (8-10): excruciating, unwilling or unable to urinate because of the pain      Burning with urination and a little blood in my urine.  I have a history of many UTIs.     2. FREQUENCY: "How many times have you had painful urination today?"      I'm going more often than usual.    3. PATTERN: "Is pain present every time you urinate or just sometimes?"      Yes 4. ONSET: "When did the painful urination start?"      Maybe 2-3 days ago.  It got better and then this morning it is really burning now.    I have polpys in my bladder.   It was a pink on the toilet paper. 5. FEVER: "Do you have a fever?" If so, ask: "What is your temperature, how was it measured, and when did it start?"     No 6. PAST UTI: "Have you had a urine infection before?" If so, ask: "When was the last time?" and "What happened that time?"      Yes 7. CAUSE: "What do you think is causing the painful urination?"  (e.g., UTI, scratch, Herpes sore)     UTI 8. OTHER SYMPTOMS: "Do  you have any other symptoms?" (e.g., flank pain, vaginal discharge, genital sores, urgency, blood in urine)     No back pain 9. PREGNANCY: "Is there any chance you are pregnant?" "When was your last menstrual period?"     Not asked  Protocols used: [place]

## 2018-05-24 ENCOUNTER — Other Ambulatory Visit: Payer: Self-pay

## 2018-05-24 ENCOUNTER — Ambulatory Visit (INDEPENDENT_AMBULATORY_CARE_PROVIDER_SITE_OTHER): Payer: Medicare Other | Admitting: Internal Medicine

## 2018-05-24 ENCOUNTER — Encounter: Payer: Self-pay | Admitting: Internal Medicine

## 2018-05-24 ENCOUNTER — Other Ambulatory Visit: Payer: Self-pay | Admitting: Internal Medicine

## 2018-05-24 DIAGNOSIS — E785 Hyperlipidemia, unspecified: Secondary | ICD-10-CM | POA: Diagnosis not present

## 2018-05-24 DIAGNOSIS — Z79899 Other long term (current) drug therapy: Secondary | ICD-10-CM

## 2018-05-24 DIAGNOSIS — Z8673 Personal history of transient ischemic attack (TIA), and cerebral infarction without residual deficits: Secondary | ICD-10-CM | POA: Diagnosis not present

## 2018-05-24 DIAGNOSIS — R7301 Impaired fasting glucose: Secondary | ICD-10-CM | POA: Diagnosis not present

## 2018-05-24 DIAGNOSIS — I10 Essential (primary) hypertension: Secondary | ICD-10-CM | POA: Diagnosis not present

## 2018-05-24 DIAGNOSIS — Z902 Acquired absence of lung [part of]: Secondary | ICD-10-CM | POA: Diagnosis not present

## 2018-05-24 DIAGNOSIS — K760 Fatty (change of) liver, not elsewhere classified: Secondary | ICD-10-CM

## 2018-05-24 MED ORDER — ROSUVASTATIN CALCIUM 10 MG PO TABS: ORAL_TABLET | ORAL | 1 refills | Status: DC

## 2018-05-24 MED ORDER — AMLODIPINE BESYLATE 5 MG PO TABS: 5.0000 mg | ORAL_TABLET | Freq: Every day | ORAL | 1 refills | Status: DC

## 2018-05-24 MED ORDER — METOPROLOL SUCCINATE ER 50 MG PO TB24: ORAL_TABLET | ORAL | 1 refills | Status: DC

## 2018-05-24 MED ORDER — HYDROCHLOROTHIAZIDE 25 MG PO TABS: 25.0000 mg | ORAL_TABLET | Freq: Every day | ORAL | 1 refills | Status: DC

## 2018-05-24 NOTE — Progress Notes (Signed)
Virtual Visit via Video Note  I connected with@ on 05/24/18 at 10:15 AM EDT by a video enabled telemedicine application and verified that I am speaking with the correct person using two identifiers. Location patient: home Location provider:work e office Persons participating in the virtual  Phone visit: patient, provider  [place]Ith national recommendations  regarding COVID 19 pandemic   video visit is advised over in office visit for this patient.  Discussed the limitations of evaluation and management by telemedicine and  availability of in person appointments. The patient expressed understanding and agreed to proceed.   HPI: Madison Erickson  Bp;  taking all meds no se feels fine  hasn't checked reading  Yet  At risk so  Isolating retired    No cp sob   TIA  Stable fu dr  Loretta Plume in fall  And to have fur MRI fro small meningioma   HLD: taking meds no se noted .  Had uti sx  Early April  And had gyne send  macrobid in  Had called nurse line and they told her to go to ed which she fortunately delined  Better.   Pulm: had "bronchitis " rx at beach 6 weeks ago and fine now.   GLUCOSE: not eating as well but not too back   Weight up to 153   ROS: See pertinent positives and negatives per HPI.  Past Medical History:  Diagnosis Date  . Abnormal LFTs    fatty liver on [place] MRI faay liver and hemangioma 2008 biopsy 2011  . Asthma    allergy  . Benign tumor   . Bladder polyps   . Blood transfusion 1969  . Blood transfusion abn reaction or complication, no procedure mishap   . Cancer (HCC)    squameous cell lsft leg hx  . Colon polyp 2009   Tubulovillous adenoma   . Diverticulosis of colon   . Eczema   . Fatty liver disease, nonalcoholic    confirmed biopsy 2011 mild no fibrosis   . GERD (gastroesophageal reflux disease)   . Hematuria    urethral polyps  . HT (hammer toe)   . Hypertension   . Kidney disease    nephritis as  child. polpys, hematuria  . Lichen sclerosus    gyne care  topical steroids  . Migraines    hx migraines  . Osteoarthritis   . Shortness of breath    partial lower rt lung lobectomy.  . Status post partial lobectomy of lung    for bronchiesctesis  . Urinary incontinence   . UTI (lower urinary tract infection)     Past Surgical History:  Procedure Laterality Date  . APPENDECTOMY    . caesarean section     X 3  . KNEE ARTHROSCOPY  12   rt  . left knee surgery on torn mansicus  11  . LUMBAR LAMINECTOMY/DECOMPRESSION MICRODISCECTOMY  03/23/2011   Procedure: LUMBAR LAMINECTOMY/DECOMPRESSION MICRODISCECTOMY 1 LEVEL;  Surgeon: Peggyann Shoals, MD;  Location: [facility];  Service: Neurosurgery;  Laterality: Left;  Left Lumbar four-five laminectomy and microdiscectomy  . PERCUTANEOUS LIVER BIOPSY  2011   fatty liver no fibrosis  . rt knee surgery  2012  . rt lung lobectomy     for bronchiectasis  . VAGINAL HYSTERECTOMY      Family History  Problem Relation Age of Onset  . Cancer Mother        lung  . Heart disease Mother   . Diabetes Paternal  Grandfather   . Cancer Paternal Grandfather        bladder  . Mental retardation Other   . Breast cancer Neg Hx     Social History   Tobacco Use  . Smoking status: Never Smoker  . Smokeless tobacco: Never Used  . Tobacco comment: wine q 2-3 weeks  Substance Use Topics  . Alcohol use: Yes    Alcohol/week: 0.0 standard drinks    Comment: 4-5 per year   . Drug use: No      Current Outpatient Medications:  .  amLODipine (NORVASC) 5 MG tablet, Take 1 tablet (5 mg total) by mouth daily., Disp: 90 tablet, Rfl: 1 .  aspirin EC 81 MG EC tablet, Take 1 tablet (81 mg total) by mouth daily., Disp: 90 tablet, Rfl: 0 .  cetirizine (ZYRTEC) 10 MG tablet, Take 10 mg by mouth daily.  , Disp: , Rfl:  .  clobetasol (TEMOVATE) 0.05 % cream, Apply 1 application topically 2 (two) times daily. , Disp: , Rfl:  .  hydrochlorothiazide (HYDRODIURIL) 25 MG tablet, Take 1 tablet (25 mg total) by mouth daily.,  Disp: 90 tablet, Rfl: 1 .  metoprolol succinate (TOPROL XL) 50 MG 24 hr tablet, TAKE 1 TABLET DAILY WITH OR IMMEDIATELY FOLLOWING A MEAL, Disp: 90 tablet, Rfl: 1 .  METRONIDAZOLE, TOPICAL, 0.75 % LOTN, Apply 1 application topically See admin instructions. Apply to face once a day, Disp: , Rfl:  .  rosuvastatin (CRESTOR) 10 MG tablet, TAKE 1 TABLET DAILY, Disp: 90 tablet, Rfl: 1  EXAM: BP Readings from Last 3 Encounters:  03/06/18 (!) 104/56  10/29/17 132/78  10/17/17 (!) 158/76   Wt Readings from Last 3 Encounters:  03/06/18 155 lb (70.3 kg)  10/29/17 149 lb (67.6 kg)  10/17/17 150 lb 6.4 oz (68.2 kg)     VITALS per patient if applicable:  GENERAL: alert, oriented, appears well and in no acute distress  Nl speech and  Affect  PSYCH/NEURO: pleasant and cooperative, no obvious depression or anxiety, speech and thought processing grossly intact Lab Results  Component Value Date   WBC 9.5 10/12/2017   HGB 14.7 10/12/2017   HCT 42.4 10/12/2017   PLT 229 10/12/2017   GLUCOSE 129 (H) 10/13/2017   CHOL 119 10/13/2017   TRIG 149 10/13/2017   HDL 29 (L) 10/13/2017   LDLDIRECT 201.9 10/26/2008   LDLCALC 60 10/13/2017   ALT 21 10/12/2017   AST 23 10/12/2017   NA 143 10/13/2017   K 3.6 10/13/2017   CL 105 10/13/2017   CREATININE 0.67 10/13/2017   BUN 13 10/13/2017   CO2 27 10/13/2017   TSH 1.11 08/15/2016   INR 1.02 10/12/2017   HGBA1C 5.9 (H) 10/13/2017    ASSESSMENT AND PLAN:  Discussed the following assessment and plan:  Medication management - Plan: Basic metabolic panel, CBC with Differential/Platelet, Hemoglobin A1c, Hepatic function panel, Lipid panel, TSH  Essential hypertension - Plan: Basic metabolic panel, CBC with Differential/Platelet, Hemoglobin A1c, Hepatic function panel, Lipid panel, TSH  Fasting hyperglycemia - Plan: Basic metabolic panel, CBC with Differential/Platelet, Hemoglobin A1c, Hepatic function panel, Lipid panel, TSH  Hyperlipidemia,  unspecified hyperlipidemia type - Plan: Basic metabolic panel, CBC with Differential/Platelet, Hemoglobin A1c, Hepatic function panel, Lipid panel, TSH  Fatty liver disease, nonalcoholic - Plan: Basic metabolic panel, CBC with Differential/Platelet, Hemoglobin A1c, Hepatic function panel, Lipid panel, TSH  Status post partial lobectomy of lung - Plan: Basic metabolic panel, CBC with Differential/Platelet, Hemoglobin A1c, Hepatic function  panel, Lipid panel, TSH  History of transient ischemic attack (TIA)  Counseled.   Plan  a1c  Soon  Disc  intensify lsi  Diet that she is now back on track.    And then fasting labs and yearly visit  In  September   Will refill meds today   Expectant management and discussion of plan and treatment with patient with opportunity to ask questions and all were answered. The patient agreed with the plan and demonstrated an understanding of the instructions.   The patient was advised to call back or seek an in-person evaluation if worsening  or having concerns . Telephone visit   Minutes    Were 22 minutes   .    Shanon Ace, MD

## 2018-05-27 ENCOUNTER — Ambulatory Visit: Payer: Medicare Other

## 2018-05-27 ENCOUNTER — Other Ambulatory Visit: Payer: Self-pay

## 2018-05-27 DIAGNOSIS — R7301 Impaired fasting glucose: Secondary | ICD-10-CM

## 2018-05-27 LAB — POCT GLYCOSYLATED HEMOGLOBIN (HGB A1C): HbA1c, POC (prediabetic range): 5.9 % (ref 5.7–6.4)

## 2018-06-03 ENCOUNTER — Other Ambulatory Visit: Payer: Self-pay

## 2018-06-03 ENCOUNTER — Telehealth: Payer: Self-pay | Admitting: Internal Medicine

## 2018-06-03 ENCOUNTER — Ambulatory Visit (INDEPENDENT_AMBULATORY_CARE_PROVIDER_SITE_OTHER): Payer: Medicare Other | Admitting: Internal Medicine

## 2018-06-03 ENCOUNTER — Encounter: Payer: Self-pay | Admitting: Internal Medicine

## 2018-06-03 DIAGNOSIS — Z87440 Personal history of urinary (tract) infections: Secondary | ICD-10-CM | POA: Diagnosis not present

## 2018-06-03 DIAGNOSIS — L900 Lichen sclerosus et atrophicus: Secondary | ICD-10-CM

## 2018-06-03 DIAGNOSIS — R3989 Other symptoms and signs involving the genitourinary system: Secondary | ICD-10-CM | POA: Diagnosis not present

## 2018-06-03 DIAGNOSIS — R300 Dysuria: Secondary | ICD-10-CM

## 2018-06-03 LAB — POC URINALSYSI DIPSTICK (AUTOMATED)
Bilirubin, UA: NEGATIVE
Glucose, UA: NEGATIVE
Ketones, UA: NEGATIVE
Nitrite, UA: NEGATIVE
Protein, UA: POSITIVE — AB
Spec Grav, UA: 1.02 (ref 1.010–1.025)
Urobilinogen, UA: 0.2 E.U./dL
pH, UA: 6 (ref 5.0–8.0)

## 2018-06-03 MED ORDER — CEFDINIR 300 MG PO CAPS: 300.0000 mg | ORAL_CAPSULE | Freq: Two times a day (BID) | ORAL | 0 refills | Status: DC

## 2018-06-03 NOTE — Telephone Encounter (Signed)
Pt states she is having pain when urinating. No abdominal pain, is having frequency and urgency, Pt is has took azo Please advise pt is leaving town tomorrow

## 2018-06-03 NOTE — Telephone Encounter (Signed)
Had phone visit today 

## 2018-06-03 NOTE — Telephone Encounter (Signed)
Copied from [place] [phone]. Topic: Quick Communication - See Telephone Encounter >> Jun 03, 2018 12:57 PM Robina Ade, Helene Kelp D wrote: CRM for notification. See Telephone encounter for: 06/03/18. Patient called and said that she has an UTI and would like to talk to Dr. Regis Bill or her CMA about getting a Rx. She is leaving out of town tomorrow. Please call patient back, thanks.

## 2018-06-03 NOTE — Progress Notes (Signed)
   Virtual Visit via Telephone Note  I connected with@ on 06/03/18 at  3:30 PM EDT by telephone  as  She doesn't have video capabilities   and verified that I am speaking with the correct person using two identifiers.   I discussed the limitations, risks, security and privacy concerns of performing an evaluation and management service by telephone and the availability of in person appointments. I also discussed with the patient that there may be a patient responsible charge related to this service. The patient expressed understanding and agreed to proceed.  Location patient: home Location provider: work  office Participants present for the call: patient, provider Patient did not have a visit in the prior 7 days to address this/these issue(s).   History of Present Illness: She has hx of recurrent utis mostly triggered by her lichen sclerosis   Of as needed clobetasol but not using as much recently.   Awoke this am with dysuria frequency and visible blood but no fever chills flank pain .   This is like one of her utis  Last one   rx macrobid   Per gyne at [facility] and had gotten better until this am  [place] is [place] and back next week ,    Observations/Objective: Patient sounds cheerful and well on the phone. I do not appreciate any SOB. Speech and thought processing are grossly intact. Patient reported vitals: ua pos blood 3+ and 1 + leuk ucx pending Assessment and Plan: Recurrent utis     Plan alternate antibiotics  ( some  nausea with ceftin so will try omnicef )  Culture pending   She states may get back with urology  Dr Jeffie Pollock to establish as possible,  History of recurrent UTIs - Plan: POCT Urinalysis Dipstick (Automated)  Dysuria - Plan: Culture, Urine, POCT Urinalysis Dipstick (Automated)  Suspected UTI - Plan: POCT Urinalysis Dipstick (Automated)  Lichen sclerosus   Follow Up Instructions: reviewed    [id number] [id number] [id number] 11-20 [id number] 21-30 I did  not refer this patient for an OV in the next 24 hours for this/these issue(s).  I discussed the assessment and treatment plan with the patient. The patient was provided an opportunity to ask questions and all were answered. The patient agreed with the plan and demonstrated an understanding of the instructions.   The patient was advised to call back or seek an in-person evaluation if the symptoms worsen or if the condition fails to improve as anticipated.  I provided 8 minutes of non-face-to-face time during this encounter.   Shanon Ace, MD

## 2018-06-05 LAB — URINE CULTURE
MICRO NUMBER:: 462460
SPECIMEN QUALITY:: ADEQUATE

## 2018-06-06 NOTE — Progress Notes (Signed)
urine culture shows e coli  sensitive to medication given . Should resolve with current treatment .FU if not better. 

## 2018-06-28 ENCOUNTER — Ambulatory Visit (INDEPENDENT_AMBULATORY_CARE_PROVIDER_SITE_OTHER): Payer: Medicare Other | Admitting: Family Medicine

## 2018-06-28 ENCOUNTER — Ambulatory Visit: Payer: Self-pay

## 2018-06-28 ENCOUNTER — Other Ambulatory Visit: Payer: Self-pay

## 2018-06-28 DIAGNOSIS — R3989 Other symptoms and signs involving the genitourinary system: Secondary | ICD-10-CM | POA: Diagnosis not present

## 2018-06-28 DIAGNOSIS — Z87440 Personal history of urinary (tract) infections: Secondary | ICD-10-CM

## 2018-06-28 DIAGNOSIS — R300 Dysuria: Secondary | ICD-10-CM

## 2018-06-28 LAB — POC URINALSYSI DIPSTICK (AUTOMATED)
Bilirubin, UA: NEGATIVE
Glucose, UA: NEGATIVE
Ketones, UA: NEGATIVE
Nitrite, UA: NEGATIVE
Protein, UA: POSITIVE — AB
Spec Grav, UA: 1.025 (ref 1.010–1.025)
Urobilinogen, UA: 0.2 E.U./dL
pH, UA: 5.5 (ref 5.0–8.0)

## 2018-06-28 MED ORDER — AMOXICILLIN-POT CLAVULANATE 875-125 MG PO TABS: 1.0000 | ORAL_TABLET | Freq: Two times a day (BID) | ORAL | 0 refills | Status: AC

## 2018-06-28 NOTE — Telephone Encounter (Signed)
Returned call to patient who states that she has frequent UTI's  She has Lichen Sclerosis that makes her susceptible to UTI. Her symptoms are frequency, burning pain with urination and blood on the tissue when she wipes.  She started to treat with AZO 2 days ago.  She has no fever or other symptoms.  Call transferred to office for scheduling.  Reason for Disposition . All other urine symptoms  Answer Assessment - Initial Assessment Questions 1. SYMPTOM: "What's the main symptom you're concerned about?" (e.g., frequency, incontinence)     Frequency pain blood 2. ONSET: "When did the symptoms  start?"     Pain started 2 days ago 3. PAIN: "Is there any pain?" If so, ask: "How bad is it?" (Scale: 1-10; mild, moderate, severe)    10 4. CAUSE: "What do you think is causing the symptoms?"     UTI 5. OTHER SYMPTOMS: "Do you have any other symptoms?" (e.g., fever, flank pain, blood in urine, pain with urination)     Blood when wipe, pain,  6. PREGNANCY: "Is there any chance you are pregnant?" "When was your last menstrual period?"     No  Protocols used: URINARY [facility]

## 2018-06-28 NOTE — Progress Notes (Signed)
Virtual Visit via Telephone Note  I connected with Madison Erickson on 06/28/18 at  1:30 PM EDT by telephone and verified that I am speaking with the correct person using two identifiers.   I discussed the limitations, risks, security and privacy concerns of performing an evaluation and management service by telephone and the availability of in person appointments. I also discussed with the patient that there may be a patient responsible charge related to this service. The patient expressed understanding and agreed to proceed.  Location patient: home Location provider: work or home office Participants present for the call: patient, provider Patient did not have a visit in the prior 7 days to address this/these issue(s).   History of Present Illness: Pt with urinary, frequency, and noting blood when pats  2-3 days ago.  Took Azo.  When woke up this am symptoms returned.  Pt denies, fever, chills, back pain.  Had nephritis as a child and with her 3rd pregnancy.  Pt has h/o Lichen sclerosis.  Has polyps in bladder   Observations/Objective: Patient sounds cheerful and well on the phone. I do not appreciate any SOB. Speech and thought processing are grossly intact. Patient reported vitals:  Assessment and Plan: Dysuria  -UA with 2+  Leuks, protein, 3+ RBCs - Plan: POCT Urinalysis Dipstick (Automated), amoxicillin-clavulanate (AUGMENTIN) 875-125 MG tablet  History of recurrent UTIs  Suspected UTI  - Plan: Urine Culture   Follow Up Instructions: F/u prn  I did not refer this patient for an OV in the next 24 hours for this/these issue(s).  I discussed the assessment and treatment plan with the patient. The patient was provided an opportunity to ask questions and all were answered. The patient agreed with the plan and demonstrated an understanding of the instructions.   The patient was advised to call back or seek an in-person evaluation if the symptoms worsen or if the condition fails to  improve as anticipated.  I provided 11 minutes of non-face-to-face time during this encounter.   Billie Ruddy, MD

## 2018-06-30 ENCOUNTER — Encounter: Payer: Self-pay | Admitting: Family Medicine

## 2018-06-30 LAB — URINE CULTURE
MICRO NUMBER:: 541863
SPECIMEN QUALITY:: ADEQUATE

## 2018-07-01 NOTE — Telephone Encounter (Signed)
Pt saw dr.banks for this

## 2018-07-22 DIAGNOSIS — L812 Freckles: Secondary | ICD-10-CM | POA: Diagnosis not present

## 2018-07-22 DIAGNOSIS — L218 Other seborrheic dermatitis: Secondary | ICD-10-CM | POA: Diagnosis not present

## 2018-07-22 DIAGNOSIS — D3613 Benign neoplasm of peripheral nerves and autonomic nervous system of lower limb, including hip: Secondary | ICD-10-CM | POA: Diagnosis not present

## 2018-07-22 DIAGNOSIS — D0461 Carcinoma in situ of skin of right upper limb, including shoulder: Secondary | ICD-10-CM | POA: Diagnosis not present

## 2018-07-22 DIAGNOSIS — D485 Neoplasm of uncertain behavior of skin: Secondary | ICD-10-CM | POA: Diagnosis not present

## 2018-07-22 DIAGNOSIS — Z85828 Personal history of other malignant neoplasm of skin: Secondary | ICD-10-CM | POA: Diagnosis not present

## 2018-07-22 DIAGNOSIS — L821 Other seborrheic keratosis: Secondary | ICD-10-CM | POA: Diagnosis not present

## 2018-09-02 DIAGNOSIS — G5621 Lesion of ulnar nerve, right upper limb: Secondary | ICD-10-CM | POA: Diagnosis not present

## 2018-09-02 DIAGNOSIS — M542 Cervicalgia: Secondary | ICD-10-CM | POA: Diagnosis not present

## 2018-09-02 DIAGNOSIS — Z681 Body mass index (BMI) 19 or less, adult: Secondary | ICD-10-CM | POA: Diagnosis not present

## 2018-09-02 DIAGNOSIS — M4802 Spinal stenosis, cervical region: Secondary | ICD-10-CM | POA: Diagnosis not present

## 2018-09-02 DIAGNOSIS — M5412 Radiculopathy, cervical region: Secondary | ICD-10-CM | POA: Diagnosis not present

## 2018-09-02 DIAGNOSIS — I10 Essential (primary) hypertension: Secondary | ICD-10-CM | POA: Diagnosis not present

## 2018-09-18 DIAGNOSIS — M542 Cervicalgia: Secondary | ICD-10-CM | POA: Diagnosis not present

## 2018-09-18 DIAGNOSIS — M4802 Spinal stenosis, cervical region: Secondary | ICD-10-CM | POA: Diagnosis not present

## 2018-09-25 ENCOUNTER — Other Ambulatory Visit (INDEPENDENT_AMBULATORY_CARE_PROVIDER_SITE_OTHER): Payer: Medicare Other

## 2018-09-25 ENCOUNTER — Other Ambulatory Visit: Payer: Self-pay

## 2018-09-25 DIAGNOSIS — K760 Fatty (change of) liver, not elsewhere classified: Secondary | ICD-10-CM

## 2018-09-25 DIAGNOSIS — Z902 Acquired absence of lung [part of]: Secondary | ICD-10-CM | POA: Diagnosis not present

## 2018-09-25 DIAGNOSIS — Z79899 Other long term (current) drug therapy: Secondary | ICD-10-CM

## 2018-09-25 DIAGNOSIS — I10 Essential (primary) hypertension: Secondary | ICD-10-CM

## 2018-09-25 DIAGNOSIS — R7301 Impaired fasting glucose: Secondary | ICD-10-CM

## 2018-09-25 DIAGNOSIS — E785 Hyperlipidemia, unspecified: Secondary | ICD-10-CM | POA: Diagnosis not present

## 2018-09-25 LAB — HEPATIC FUNCTION PANEL
ALT: 20 U/L (ref 0–35)
AST: 20 U/L (ref 0–37)
Albumin: 4.4 g/dL (ref 3.5–5.2)
Alkaline Phosphatase: 43 U/L (ref 39–117)
Bilirubin, Direct: 0.1 mg/dL (ref 0.0–0.3)
Total Bilirubin: 0.6 mg/dL (ref 0.2–1.2)
Total Protein: 6.6 g/dL (ref 6.0–8.3)

## 2018-09-25 LAB — LIPID PANEL
Cholesterol: 110 mg/dL (ref 0–200)
HDL: 33.6 mg/dL — ABNORMAL LOW (ref >39.00)
LDL Cholesterol: 54 mg/dL (ref 0–99)
NonHDL: 76.48
Total CHOL/HDL Ratio: 3
Triglycerides: 111 mg/dL (ref 0.0–149.0)
VLDL: 22.2 mg/dL (ref 0.0–40.0)

## 2018-09-25 LAB — CBC WITH DIFFERENTIAL/PLATELET
Basophils Absolute: 0.1 10*3/uL (ref 0.0–0.1)
Basophils Relative: 1.2 % (ref 0.0–3.0)
Eosinophils Absolute: 0.3 10*3/uL (ref 0.0–0.7)
Eosinophils Relative: 3 % (ref 0.0–5.0)
HCT: 41.4 % (ref 36.0–46.0)
Hemoglobin: 14.3 g/dL (ref 12.0–15.0)
Lymphocytes Relative: 33.8 % (ref 12.0–46.0)
Lymphs Abs: 3.5 10*3/uL (ref 0.7–4.0)
MCHC: 34.4 g/dL (ref 30.0–36.0)
MCV: 85.8 fl (ref 78.0–100.0)
Monocytes Absolute: 0.7 10*3/uL (ref 0.1–1.0)
Monocytes Relative: 7.1 % (ref 3.0–12.0)
Neutro Abs: 5.8 10*3/uL (ref 1.4–7.7)
Neutrophils Relative %: 54.9 % (ref 43.0–77.0)
Platelets: 257 10*3/uL (ref 150.0–400.0)
RBC: 4.83 Mil/uL (ref 3.87–5.11)
RDW: 12.5 % (ref 11.5–15.5)
WBC: 10.5 10*3/uL (ref 4.0–10.5)

## 2018-09-25 LAB — BASIC METABOLIC PANEL
BUN: 12 mg/dL (ref 6–23)
CO2: 30 mEq/L (ref 19–32)
Calcium: 9.9 mg/dL (ref 8.4–10.5)
Chloride: 103 mEq/L (ref 96–112)
Creatinine, Ser: 0.61 mg/dL (ref 0.40–1.20)
GFR: 94.88 mL/min (ref >60.00)
Glucose, Bld: 127 mg/dL — ABNORMAL HIGH (ref 70–99)
Potassium: 3.3 mEq/L — ABNORMAL LOW (ref 3.5–5.1)
Sodium: 141 mEq/L (ref 135–145)

## 2018-09-25 LAB — TSH: TSH: 1.84 u[IU]/mL (ref 0.35–4.50)

## 2018-09-25 LAB — HEMOGLOBIN A1C: Hgb A1c MFr Bld: 6.2 % (ref 4.6–6.5)

## 2018-09-25 NOTE — Progress Notes (Signed)
Chief Complaint  Patient presents with  . Annual Exam    Pt has no concerns for today   . Medication Management  . Hyperlipidemia  . Hypertension    HPI: Madison Erickson 78 y.o. come in for yearly check  and med check   Under eval for neck  Pain and righ arm finger numbness  Pinky   Right hand barco nvs  Mri neck  :  Right hand   NS    Fu der Vertell Limber later this month  LIPIDS refill meds BP seems to be ok no cramps or se noted   Right shoulder botherson  Hx of  Left shoulder  Disease better  Wonders if right shoulder from fall in past could be adding to sx in RUE.  ROS: See pertinent positives and negatives per HPI. No fever  New newro sx visition   Past Medical History:  Diagnosis Date  . Abnormal LFTs    fatty liver on [place] MRI faay liver and hemangioma 2008 biopsy 2011  . Asthma    allergy  . Benign tumor   . Bladder polyps   . Blood transfusion 1969  . Blood transfusion abn reaction or complication, no procedure mishap   . Cancer (HCC)    squameous cell lsft leg hx  . Colon polyp 2009   Tubulovillous adenoma   . Diverticulosis of colon   . Eczema   . Fatty liver disease, nonalcoholic    confirmed biopsy 2011 mild no fibrosis   . GERD (gastroesophageal reflux disease)   . Hematuria    urethral polyps  . HT (hammer toe)   . Hypertension   . Kidney disease    nephritis as  child. polpys, hematuria  . Lichen sclerosus    gyne care topical steroids  . Migraines    hx migraines  . Osteoarthritis   . Shortness of breath    partial lower rt lung lobectomy.  . Status post partial lobectomy of lung    for bronchiesctesis  . Urinary incontinence   . UTI (lower urinary tract infection)     Family History  Problem Relation Age of Onset  . Cancer Mother        lung  . Heart disease Mother   . Diabetes Paternal Grandfather   . Cancer Paternal Grandfather        bladder  . Mental retardation Other   . Breast cancer Neg Hx     Social History   Socioeconomic  History  . Marital status: Married    Spouse name: Not on file  . Number of children: Not on file  . Years of education: Not on file  . Highest education level: Not on file  Occupational History  . Not on file  Social Needs  . Financial resource strain: Not on file  . Food insecurity    Worry: Not on file    Inability: Not on file  . Transportation needs    Medical: Not on file    Non-medical: Not on file  Tobacco Use  . Smoking status: Never Smoker  . Smokeless tobacco: Never Used  . Tobacco comment: wine q 2-3 weeks  Substance and Sexual Activity  . Alcohol use: Yes    Alcohol/week: 0.0 standard drinks    Comment: 4-5 per year   . Drug use: No  . Sexual activity: Not on file    Comment: hysterectomy  Lifestyle  . Physical activity    Days per week: Not  on file    Minutes per session: Not on file  . Stress: Not on file  Relationships  . Social Herbalist on phone: Not on file    Gets together: Not on file    Attends religious service: Not on file    Active member of club or organization: Not on file    Attends meetings of clubs or organizations: Not on file    Relationship status: Not on file  Other Topics Concern  . 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 aspirin EC 81 MG EC tablet Take 1 tablet (81 mg total) by mouth daily. 90 tablet 0  . cetirizine (ZYRTEC) 10 MG tablet Take 10 mg by mouth daily.      . clobetasol (TEMOVATE) 0.05 % cream Apply 1 application topically 2 (two) times daily.     [name] METRONIDAZOLE, TOPICAL, 0.75 % LOTN Apply 1 application topically See admin instructions. Apply to face once a day    . amLODipine (NORVASC) 5 MG tablet Take 1 tablet (5 mg total) by mouth daily. 90 tablet 1  .  hydrochlorothiazide (HYDRODIURIL) 25 MG tablet Take 1 tablet (25 mg total) by mouth daily. 90 tablet 1  . metoprolol succinate (TOPROL XL) 50 MG 24 hr tablet TAKE 1 TABLET DAILY WITH OR IMMEDIATELY FOLLOWING A MEAL 90 tablet 1  . rosuvastatin (CRESTOR) 10 MG tablet TAKE 1 TABLET DAILY 90 tablet 1  . cefdinir (OMNICEF) 300 MG capsule Take 1 capsule (300 mg total) by mouth 2 (two) times daily. (Patient not taking: Reported on 09/27/2018) 14 capsule 0   No facility-administered medications prior to visit.      EXAM:  BP 122/64 (BP Location: Right Arm, Patient Position: Sitting, Cuff Size: Normal)   Pulse 87   Temp 97.8 F (36.6 C) (Temporal)   Ht 5' 2.5" (1.588 m)   Wt 152 lb 3.2 oz (69 kg)   SpO2 97%   BMI 27.39 kg/m   Body mass index is 27.39 kg/m.  GENERAL: vitals reviewed and listed above, alert, oriented, appears well hydrated and in no acute distress HEENT: atraumatic, conjunctiva  clear, no obvious abnormalities on inspection of external nose and ears  NECK: no obvious masses on inspection palpation  Some stiffness  LUNGS: clear to auscultation bilaterally, no wheezes, rales or rhonchi, good air movement CV: HRRR, no clubbing cyanosis or  peripheral edema nl cap refill  MS: moves all extremities without noticeable focal  Abnormality right  Shoulder elevation .  Grossly nl  PSYCH: pleasant and cooperative, no obvious depression or anxiety Lab Results  Component Value Date   WBC 10.5 09/25/2018   HGB 14.3 09/25/2018   HCT 41.4 09/25/2018   PLT 257.0 09/25/2018   GLUCOSE 127 (H) 09/25/2018   CHOL 110 09/25/2018   TRIG 111.0 09/25/2018   HDL 33.60 (L) 09/25/2018   LDLDIRECT 201.9 10/26/2008   LDLCALC 54 09/25/2018   ALT 20 09/25/2018   AST 20 09/25/2018   NA 141 09/25/2018   K 3.3 (L) 09/25/2018   CL 103 09/25/2018   CREATININE 0.61 09/25/2018   BUN 12 09/25/2018   CO2 30 09/25/2018  TSH 1.84 09/25/2018   INR 1.02 10/12/2017   HGBA1C 6.2 09/25/2018   BP Readings  from Last 3 Encounters:  09/27/18 122/64  03/06/18 (!) 104/56  10/29/17 132/78   Wt Readings from Last 3 Encounters:  09/27/18 152 lb 3.2 oz (69 kg)  03/06/18 155 lb (70.3 kg)  10/29/17 149 lb (67.6 kg)   Lab reviewed  ASSESSMENT AND PLAN:  Discussed the following assessment and plan:  Essential hypertension  Hyperlipidemia, unspecified hyperlipidemia type  Fasting hyperglycemia  Medication management  Hypokalemia - Plan: Basic metabolic panel, Magnesium  Need for influenza vaccination - Plan: Flu Vaccine QUAD High Dose(Fluad)  Right shoulder pain, unspecified chronicity Flu  Vaccine today  Disc potassium rich foods in lieu of adding  rx med  Will try  No obv sx at this time  Under eval for UE sx and she should see ortho about shoulder -Patient advised to return or notify health care team  if  new concerns arise.  Patient Instructions  See ortho about your right shoulder .  Plan high potassium foods  And check potassium magnesium levels in about 3 weeks  .   Will refill meds  If all ok then  Yearly check and labs and meds  Get the singrix when conveneient at pharmacy.    Potassium Content of Foods  Potassium is a mineral found in many foods and drinks. It affects how the heart works, and helps keep fluids and minerals balanced in the body. The amount of potassium you need each day depends on your age and any medical conditions you may have. Talk to your health care provider or dietitian about how much potassium you need. The following lists of foods provide the general serving size for foods and the approximate amount of potassium in each serving, listed in milligrams (mg). Actual values may vary depending on the product and how it is processed. High in potassium The following foods and beverages have 200 mg or more of potassium per serving:  Apricots (raw) - 2 have 200 mg of potassium.  Apricots (dry) - 5 have 200 mg of potassium.  Artichoke - 1 medium has 345  mg of potassium.  Avocado -  fruit has 245 mg of potassium.  Banana - 1 medium fruit has 425 mg of potassium.  [place] or baked beans (canned) -  cup has 280 mg of potassium.  White beans (canned) -  cup has 595 mg potassium.  Beef roast - 3 oz has 320 mg of potassium.  Ground beef - 3 oz has 270 mg of potassium.  Beets (raw or cooked) -  cup has 260 mg of potassium.  Bran muffin - 2 oz has 300 mg of potassium.  Broccoli (cooked) -  cup has 230 mg of potassium.  Brussels sprouts -  cup has 250 mg of potassium.  Cantaloupe -  cup has 215 mg of potassium.  Cereal, 100% bran -  cup has 200-400 mg of potassium.  Cheeseburger -1 single fast food burger has 225-400 mg of potassium.  Chicken - 3 oz has 220 mg of potassium.  Clams (canned) - 3 oz has 535 mg of potassium.  Crab - 3 oz has 225 mg of potassium.  Dates - 5 have 270 mg of potassium.  Dried beans and peas -  cup has 300-475 mg of potassium.  Figs (dried) - 2 have 260 mg of potassium.  Fish (halibut, tuna, cod, snapper) - 3 oz has 480 mg of potassium.  Fish (salmon, haddock, swordfish, perch) - 3 oz has 300 mg of potassium.  Fish (tuna, canned) - 3 oz has 200 mg of potassium.  [place] fries (fast food) - 3 oz has 470 mg of potassium.  Granola with fruit and nuts -  cup has 200 mg of potassium.  Grapefruit juice -  cup has 200 mg of potassium.  Honeydew melon -  cup has 200 mg of potassium.  Kale (raw) - 1 cup has 300 mg of potassium.  Kiwi - 1 medium fruit has 240 mg of potassium.  Kohlrabi, rutabaga, parsnips -  cup has 280 mg of potassium.  Lentils -  cup has 365 mg of potassium.  Mango - 1 each has 325 mg of potassium.  Milk (nonfat, low-fat, whole, buttermilk) - 1 cup has 350-380 mg of potassium.  Milk (chocolate) - 1 cup has 420 mg of potassium  Molasses - 1 Tbsp has 295 mg of potassium.  Mushrooms -  cup has 280 mg of potassium.  Nectarine - 1 each has 275 mg of potassium.   Nuts (almonds, peanuts, hazelnuts, [place], cashew, mixed) - 1 oz has 200 mg of potassium.  Nuts (pistachios) - 1 oz has 295 mg of potassium.  Orange - 1 fruit has 240 mg of potassium.  Orange juice -  cup has 235 mg of potassium.  Papaya -  medium fruit has 390 mg of potassium.  Peanut butter (chunky) - 2 Tbsp has 240 mg of potassium.  Peanut butter (smooth) - 2 Tbsp has 210 mg of potassium.  Pear - 1 medium (200 mg) of potassium.  Pomegranate - 1 whole fruit has 400 mg of potassium.  Pomegranate juice -  cup has 215 mg of potassium.  Pork - 3 oz has 350 mg of potassium.  Potato chips (salted) - 1 oz has 465 mg of potassium.  Potato (baked with skin) - 1 medium has 925 mg of potassium.  Potato (boiled) -  cup has 255 mg of potassium.  Potato (Mashed) -  cup has 330 mg of potassium.  Prune juice -  cup has 370 mg of potassium.  Prunes - 5 have 305 mg of potassium.  Pudding (chocolate) -  cup has 230 mg of potassium.  Pumpkin (canned) -  cup has 250 mg of potassium.  Raisins (seedless) -  cup has 270 mg of potassium.  Seeds (sunflower or pumpkin) - 1 oz has 240 mg of potassium.  Soy milk - 1 cup has 300 mg of potassium.  Spinach (cooked) - 1/2 cup has 420 mg of potassium.  Spinach (canned) -  cup has 370 mg of potassium.  Sweet potato (baked with skin) - 1 medium has 450 mg of potassium.  Swiss chard -  cup has 480 mg of potassium.  Tomato or vegetable juice -  cup has 275 mg of potassium.  Tomato (sauce or puree) -  cup has 400-550 mg of potassium.  Tomato (raw) - 1 medium has 290 mg of potassium.  Tomato (canned) -  cup has 200-300 mg of potassium.  [place] - 3 oz has 250 mg of potassium.  Wheat germ - 1 oz has 250 mg of potassium.  Winter squash -  cup has 250 mg of potassium.  Yogurt (plain or fruited) - 6 oz has 260-435 mg of potassium.  Zucchini -  cup has 220 mg of potassium. Moderate in potassium The following foods and  beverages have 50-200 mg of potassium per serving:  Apple -  1 fruit has 150 mg of potassium  Apple juice -  cup has 150 mg of potassium  Applesauce -  cup has 90 mg of potassium  Apricot nectar -  cup has 140 mg of potassium  Asparagus (small spears) -  cup has 155 mg of potassium  Asparagus (large spears) - 6 have 155 mg of potassium  Bagel (cinnamon raisin) - 1 four-inch bagel has 130 mg of potassium  Bagel (egg or plain) - 1 four- inch bagel has 70 mg of potassium  Beans (green) -  cup has 90 mg of potassium  Beans (yellow) -  cup has 190 mg of potassium  Beer, regular - 12 oz has 100 mg of potassium  Beets (canned) -  cup has 125 mg of potassium  Blackberries -  cup has 115 mg of potassium  Blueberries -  cup has 60 mg of potassium  Bread (whole wheat) - 1 slice has 70 mg of potassium  Broccoli (raw) -  cup has 145 mg of potassium  Cabbage -  cup has 150 mg of potassium  Carrots (cooked or raw) -  cup has 180 mg of potassium  Cauliflower (raw) -  cup has 150 mg of potassium  Celery (raw) -  cup has 155 mg of potassium  Cereal, bran flakes -  cup has 120-150 mg of potassium  Cheese (cottage) -  cup has 110 mg of potassium  Cherries - 10 have 150 mg of potassium  Chocolate - 1 oz bar has 165 mg of potassium  Coffee (brewed) - 6 oz has 90 mg of potassium  Corn -  cup or 1 ear has 195 mg of potassium  Cucumbers -  cup has 80 mg of potassium  Egg - 1 large egg has 60 mg of potassium  Eggplant -  cup has 60 mg of potassium  Endive (raw) -  cup has 80 mg of potassium  English muffin - 1 has 65 mg of potassium  Fish (ocean perch) - 3 oz has 192 mg of potassium  Frankfurter, beef or pork - 1 has 75 mg of potassium  Fruit cocktail -  cup has 115 mg of potassium  Grape juice -  cup has 170 mg of potassium  Grapefruit -  fruit has 175 mg of potassium  Grapes -  cup has 155 mg of potassium  Greens: kale, turnip, collard -   cup has 110-150 mg of potassium  Ice cream or frozen yogurt (chocolate) -  cup has 175 mg of potassium  Ice cream or frozen yogurt (vanilla) -  cup has 120-150 mg of potassium  Lemons, limes - 1 each has 80 mg of potassium  Lettuce - 1 cup has 100 mg of potassium  Mixed vegetables -  cup has 150 mg of potassium  Mushrooms, raw -  cup has 110 mg of potassium  Nuts (walnuts, pecans, or macadamia) - 1 oz has 125 mg of potassium  Oatmeal -  cup has 80 mg of potassium  Okra -  cup has 110 mg of potassium  Onions -  cup has 120 mg of potassium  Peach - 1 has 185 mg of potassium  Peaches (canned) -  cup has 120 mg of potassium  Pears (canned) -  cup has 120 mg of potassium  Peas, green (frozen) -  cup has 90 mg of potassium  Peppers (Green) -  cup has 130 mg of potassium  Peppers (Red) -  cup has 160  mg of potassium  Pineapple juice -  cup has 165 mg of potassium  Pineapple (fresh or canned) -  cup has 100 mg of potassium  Plums - 1 has 105 mg of potassium  Pudding, vanilla -  cup has 150 mg of potassium  Raspberries -  cup has 90 mg of potassium  Rhubarb -  cup has 115 mg of potassium  Rice, wild -  cup has 80 mg of potassium  Shrimp - 3 oz has 155 mg of potassium  Spinach (raw) - 1 cup has 170 mg of potassium  Strawberries -  cup has 125 mg of potassium  Summer squash -  cup has 175-200 mg of potassium  Swiss chard (raw) - 1 cup has 135 mg of potassium  Tangerines - 1 fruit has 140 mg of potassium  Tea, brewed - 6 oz has 65 mg of potassium  Turnips -  cup has 140 mg of potassium  Watermelon -  cup has 85 mg of potassium  Wine (Red, table) - 5 oz has 180 mg of potassium  Wine (White, table) - 5 oz 100 mg of potassium Low in potassium The following foods and beverages have less than 50 mg of potassium per serving.  Bread (white) - 1 slice has 30 mg of potassium  Carbonated beverages - 12 oz has less than 5 mg of potassium   Cheese - 1 oz has 20-30 mg of potassium  Cranberries -  cup has 45 mg of potassium  Cranberry juice cocktail -  cup has 20 mg of potassium  Fats and oils - 1 Tbsp has less than 5 mg of potassium  Hummus - 1 Tbsp has 32 mg of potassium  Nectar (papaya, mango, or pear) -  cup has 35 mg of potassium  Rice (white or brown) -  cup has 50 mg of potassium  Spaghetti or macaroni (cooked) -  cup has 30 mg of potassium  Tortilla, flour or corn - 1 has 50 mg of potassium  Waffle - 1 four-inch waffle has 50 mg of potassium  Water chestnuts -  cup has 40 mg of potassium Summary  Potassium is a mineral found in many foods and drinks. It affects how the heart works, and helps keep fluids and minerals balanced in the body.  The amount of potassium you need each day depends on your age and any existing medical conditions you may have. Your health care provider or dietitian may recommend an amount of potassium that you should have each day. This information is not intended to replace advice given to you by your health care provider. Make sure you discuss any questions you have with your health care provider. Document Released: 08/23/2004 Document Revised: 12/22/2016 Document Reviewed: 04/05/2016 Elsevier Patient Education  2020 [employer]  M.D.

## 2018-09-26 ENCOUNTER — Encounter: Payer: Medicare Other | Admitting: Internal Medicine

## 2018-09-26 DIAGNOSIS — G5622 Lesion of ulnar nerve, left upper limb: Secondary | ICD-10-CM | POA: Diagnosis not present

## 2018-09-27 ENCOUNTER — Ambulatory Visit (INDEPENDENT_AMBULATORY_CARE_PROVIDER_SITE_OTHER): Payer: Medicare Other | Admitting: Internal Medicine

## 2018-09-27 ENCOUNTER — Other Ambulatory Visit: Payer: Self-pay

## 2018-09-27 ENCOUNTER — Encounter: Payer: Self-pay | Admitting: Internal Medicine

## 2018-09-27 VITALS — BP 122/64 | HR 87 | Temp 97.8°F | Ht 62.5 in | Wt 152.2 lb

## 2018-09-27 DIAGNOSIS — M25511 Pain in right shoulder: Secondary | ICD-10-CM

## 2018-09-27 DIAGNOSIS — R7301 Impaired fasting glucose: Secondary | ICD-10-CM | POA: Diagnosis not present

## 2018-09-27 DIAGNOSIS — Z79899 Other long term (current) drug therapy: Secondary | ICD-10-CM

## 2018-09-27 DIAGNOSIS — Z23 Encounter for immunization: Secondary | ICD-10-CM

## 2018-09-27 DIAGNOSIS — E876 Hypokalemia: Secondary | ICD-10-CM

## 2018-09-27 DIAGNOSIS — I10 Essential (primary) hypertension: Secondary | ICD-10-CM

## 2018-09-27 DIAGNOSIS — E785 Hyperlipidemia, unspecified: Secondary | ICD-10-CM

## 2018-09-27 MED ORDER — AMLODIPINE BESYLATE 5 MG PO TABS: 5.0000 mg | ORAL_TABLET | Freq: Every day | ORAL | 3 refills | Status: DC

## 2018-09-27 MED ORDER — METOPROLOL SUCCINATE ER 50 MG PO TB24: ORAL_TABLET | ORAL | 3 refills | Status: DC

## 2018-09-27 MED ORDER — ROSUVASTATIN CALCIUM 10 MG PO TABS: ORAL_TABLET | ORAL | 3 refills | Status: DC

## 2018-09-27 MED ORDER — HYDROCHLOROTHIAZIDE 25 MG PO TABS: 25.0000 mg | ORAL_TABLET | Freq: Every day | ORAL | 3 refills | Status: DC

## 2018-09-27 NOTE — Patient Instructions (Addendum)
See ortho about your right shoulder .  Plan high potassium foods  And check potassium magnesium levels in about 3 weeks  .   Will refill meds  If all ok then  Yearly check and labs and meds  Get the singrix when conveneient at pharmacy.    Potassium Content of Foods  Potassium is a mineral found in many foods and drinks. It affects how the heart works, and helps keep fluids and minerals balanced in the body. The amount of potassium you need each day depends on your age and any medical conditions you may have. Talk to your health care provider or dietitian about how much potassium you need. The following lists of foods provide the general serving size for foods and the approximate amount of potassium in each serving, listed in milligrams (mg). Actual values may vary depending on the product and how it is processed. High in potassium The following foods and beverages have 200 mg or more of potassium per serving:  Apricots (raw) - 2 have 200 mg of potassium.  Apricots (dry) - 5 have 200 mg of potassium.  Artichoke - 1 medium has 345 mg of potassium.  Avocado -  fruit has 245 mg of potassium.  Banana - 1 medium fruit has 425 mg of potassium.  [place] or baked beans (canned) -  cup has 280 mg of potassium.  White beans (canned) -  cup has 595 mg potassium.  Beef roast - 3 oz has 320 mg of potassium.  Ground beef - 3 oz has 270 mg of potassium.  Beets (raw or cooked) -  cup has 260 mg of potassium.  Bran muffin - 2 oz has 300 mg of potassium.  Broccoli (cooked) -  cup has 230 mg of potassium.  Brussels sprouts -  cup has 250 mg of potassium.  Cantaloupe -  cup has 215 mg of potassium.  Cereal, 100% bran -  cup has 200-400 mg of potassium.  Cheeseburger -1 single fast food burger has 225-400 mg of potassium.  Chicken - 3 oz has 220 mg of potassium.  Clams (canned) - 3 oz has 535 mg of potassium.  Crab - 3 oz has 225 mg of potassium.  Dates - 5 have 270 mg of  potassium.  Dried beans and peas -  cup has 300-475 mg of potassium.  Figs (dried) - 2 have 260 mg of potassium.  Fish (halibut, tuna, cod, snapper) - 3 oz has 480 mg of potassium.  Fish (salmon, haddock, swordfish, perch) - 3 oz has 300 mg of potassium.  Fish (tuna, canned) - 3 oz has 200 mg of potassium.  [place] fries (fast food) - 3 oz has 470 mg of potassium.  Granola with fruit and nuts -  cup has 200 mg of potassium.  Grapefruit juice -  cup has 200 mg of potassium.  Honeydew melon -  cup has 200 mg of potassium.  Kale (raw) - 1 cup has 300 mg of potassium.  Kiwi - 1 medium fruit has 240 mg of potassium.  Kohlrabi, rutabaga, parsnips -  cup has 280 mg of potassium.  Lentils -  cup has 365 mg of potassium.  Mango - 1 each has 325 mg of potassium.  Milk (nonfat, low-fat, whole, buttermilk) - 1 cup has 350-380 mg of potassium.  Milk (chocolate) - 1 cup has 420 mg of potassium  Molasses - 1 Tbsp has 295 mg of potassium.  Mushrooms -  cup has 280 mg of  potassium.  Nectarine - 1 each has 275 mg of potassium.  Nuts (almonds, peanuts, hazelnuts, [place], cashew, mixed) - 1 oz has 200 mg of potassium.  Nuts (pistachios) - 1 oz has 295 mg of potassium.  Orange - 1 fruit has 240 mg of potassium.  Orange juice -  cup has 235 mg of potassium.  Papaya -  medium fruit has 390 mg of potassium.  Peanut butter (chunky) - 2 Tbsp has 240 mg of potassium.  Peanut butter (smooth) - 2 Tbsp has 210 mg of potassium.  Pear - 1 medium (200 mg) of potassium.  Pomegranate - 1 whole fruit has 400 mg of potassium.  Pomegranate juice -  cup has 215 mg of potassium.  Pork - 3 oz has 350 mg of potassium.  Potato chips (salted) - 1 oz has 465 mg of potassium.  Potato (baked with skin) - 1 medium has 925 mg of potassium.  Potato (boiled) -  cup has 255 mg of potassium.  Potato (Mashed) -  cup has 330 mg of potassium.  Prune juice -  cup has 370 mg of potassium.   Prunes - 5 have 305 mg of potassium.  Pudding (chocolate) -  cup has 230 mg of potassium.  Pumpkin (canned) -  cup has 250 mg of potassium.  Raisins (seedless) -  cup has 270 mg of potassium.  Seeds (sunflower or pumpkin) - 1 oz has 240 mg of potassium.  Soy milk - 1 cup has 300 mg of potassium.  Spinach (cooked) - 1/2 cup has 420 mg of potassium.  Spinach (canned) -  cup has 370 mg of potassium.  Sweet potato (baked with skin) - 1 medium has 450 mg of potassium.  Swiss chard -  cup has 480 mg of potassium.  Tomato or vegetable juice -  cup has 275 mg of potassium.  Tomato (sauce or puree) -  cup has 400-550 mg of potassium.  Tomato (raw) - 1 medium has 290 mg of potassium.  Tomato (canned) -  cup has 200-300 mg of potassium.  [place] - 3 oz has 250 mg of potassium.  Wheat germ - 1 oz has 250 mg of potassium.  Winter squash -  cup has 250 mg of potassium.  Yogurt (plain or fruited) - 6 oz has 260-435 mg of potassium.  Zucchini -  cup has 220 mg of potassium. Moderate in potassium The following foods and beverages have 50-200 mg of potassium per serving:  Apple - 1 fruit has 150 mg of potassium  Apple juice -  cup has 150 mg of potassium  Applesauce -  cup has 90 mg of potassium  Apricot nectar -  cup has 140 mg of potassium  Asparagus (small spears) -  cup has 155 mg of potassium  Asparagus (large spears) - 6 have 155 mg of potassium  Bagel (cinnamon raisin) - 1 four-inch bagel has 130 mg of potassium  Bagel (egg or plain) - 1 four- inch bagel has 70 mg of potassium  Beans (green) -  cup has 90 mg of potassium  Beans (yellow) -  cup has 190 mg of potassium  Beer, regular - 12 oz has 100 mg of potassium  Beets (canned) -  cup has 125 mg of potassium  Blackberries -  cup has 115 mg of potassium  Blueberries -  cup has 60 mg of potassium  Bread (whole wheat) - 1 slice has 70 mg of potassium  Broccoli (raw) -  cup  has 145 mg of  potassium  Cabbage -  cup has 150 mg of potassium  Carrots (cooked or raw) -  cup has 180 mg of potassium  Cauliflower (raw) -  cup has 150 mg of potassium  Celery (raw) -  cup has 155 mg of potassium  Cereal, bran flakes -  cup has 120-150 mg of potassium  Cheese (cottage) -  cup has 110 mg of potassium  Cherries - 10 have 150 mg of potassium  Chocolate - 1 oz bar has 165 mg of potassium  Coffee (brewed) - 6 oz has 90 mg of potassium  Corn -  cup or 1 ear has 195 mg of potassium  Cucumbers -  cup has 80 mg of potassium  Egg - 1 large egg has 60 mg of potassium  Eggplant -  cup has 60 mg of potassium  Endive (raw) -  cup has 80 mg of potassium  English muffin - 1 has 65 mg of potassium  Fish (ocean perch) - 3 oz has 192 mg of potassium  Frankfurter, beef or pork - 1 has 75 mg of potassium  Fruit cocktail -  cup has 115 mg of potassium  Grape juice -  cup has 170 mg of potassium  Grapefruit -  fruit has 175 mg of potassium  Grapes -  cup has 155 mg of potassium  Greens: kale, turnip, collard -  cup has 110-150 mg of potassium  Ice cream or frozen yogurt (chocolate) -  cup has 175 mg of potassium  Ice cream or frozen yogurt (vanilla) -  cup has 120-150 mg of potassium  Lemons, limes - 1 each has 80 mg of potassium  Lettuce - 1 cup has 100 mg of potassium  Mixed vegetables -  cup has 150 mg of potassium  Mushrooms, raw -  cup has 110 mg of potassium  Nuts (walnuts, pecans, or macadamia) - 1 oz has 125 mg of potassium  Oatmeal -  cup has 80 mg of potassium  Okra -  cup has 110 mg of potassium  Onions -  cup has 120 mg of potassium  Peach - 1 has 185 mg of potassium  Peaches (canned) -  cup has 120 mg of potassium  Pears (canned) -  cup has 120 mg of potassium  Peas, green (frozen) -  cup has 90 mg of potassium  Peppers (Green) -  cup has 130 mg of potassium  Peppers (Red) -  cup has 160 mg of potassium  Pineapple  juice -  cup has 165 mg of potassium  Pineapple (fresh or canned) -  cup has 100 mg of potassium  Plums - 1 has 105 mg of potassium  Pudding, vanilla -  cup has 150 mg of potassium  Raspberries -  cup has 90 mg of potassium  Rhubarb -  cup has 115 mg of potassium  Rice, wild -  cup has 80 mg of potassium  Shrimp - 3 oz has 155 mg of potassium  Spinach (raw) - 1 cup has 170 mg of potassium  Strawberries -  cup has 125 mg of potassium  Summer squash -  cup has 175-200 mg of potassium  Swiss chard (raw) - 1 cup has 135 mg of potassium  Tangerines - 1 fruit has 140 mg of potassium  Tea, brewed - 6 oz has 65 mg of potassium  Turnips -  cup has 140 mg of potassium  Watermelon -  cup has 85 mg  of potassium  Wine (Red, table) - 5 oz has 180 mg of potassium  Wine (White, table) - 5 oz 100 mg of potassium Low in potassium The following foods and beverages have less than 50 mg of potassium per serving.  Bread (white) - 1 slice has 30 mg of potassium  Carbonated beverages - 12 oz has less than 5 mg of potassium  Cheese - 1 oz has 20-30 mg of potassium  Cranberries -  cup has 45 mg of potassium  Cranberry juice cocktail -  cup has 20 mg of potassium  Fats and oils - 1 Tbsp has less than 5 mg of potassium  Hummus - 1 Tbsp has 32 mg of potassium  Nectar (papaya, mango, or pear) -  cup has 35 mg of potassium  Rice (white or brown) -  cup has 50 mg of potassium  Spaghetti or macaroni (cooked) -  cup has 30 mg of potassium  Tortilla, flour or corn - 1 has 50 mg of potassium  Waffle - 1 four-inch waffle has 50 mg of potassium  Water chestnuts -  cup has 40 mg of potassium Summary  Potassium is a mineral found in many foods and drinks. It affects how the heart works, and helps keep fluids and minerals balanced in the body.  The amount of potassium you need each day depends on your age and any existing medical conditions you may have. Your health care  provider or dietitian may recommend an amount of potassium that you should have each day. This information is not intended to replace advice given to you by your health care provider. Make sure you discuss any questions you have with your health care provider. Document Released: 08/23/2004 Document Revised: 12/22/2016 Document Reviewed: 04/05/2016 Elsevier Patient Education  [place].

## 2018-10-14 ENCOUNTER — Other Ambulatory Visit: Payer: Self-pay | Admitting: Obstetrics and Gynecology

## 2018-10-14 DIAGNOSIS — Z1231 Encounter for screening mammogram for malignant neoplasm of breast: Secondary | ICD-10-CM

## 2018-10-17 DIAGNOSIS — Z6827 Body mass index (BMI) 27.0-27.9, adult: Secondary | ICD-10-CM | POA: Diagnosis not present

## 2018-10-17 DIAGNOSIS — M542 Cervicalgia: Secondary | ICD-10-CM | POA: Diagnosis not present

## 2018-10-17 DIAGNOSIS — G5621 Lesion of ulnar nerve, right upper limb: Secondary | ICD-10-CM | POA: Diagnosis not present

## 2018-10-17 DIAGNOSIS — I10 Essential (primary) hypertension: Secondary | ICD-10-CM | POA: Diagnosis not present

## 2018-10-17 DIAGNOSIS — M47812 Spondylosis without myelopathy or radiculopathy, cervical region: Secondary | ICD-10-CM | POA: Diagnosis not present

## 2018-10-18 ENCOUNTER — Ambulatory Visit
Admission: RE | Admit: 2018-10-18 | Discharge: 2018-10-18 | Disposition: A | Discharge status: Home / Self Care | Payer: Medicare Other | Source: Ambulatory Visit | Attending: Neurology | Admitting: Neurology

## 2018-10-18 ENCOUNTER — Other Ambulatory Visit (INDEPENDENT_AMBULATORY_CARE_PROVIDER_SITE_OTHER): Payer: Medicare Other

## 2018-10-18 ENCOUNTER — Other Ambulatory Visit: Payer: Self-pay

## 2018-10-18 DIAGNOSIS — E876 Hypokalemia: Secondary | ICD-10-CM | POA: Diagnosis not present

## 2018-10-18 DIAGNOSIS — D329 Benign neoplasm of meninges, unspecified: Secondary | ICD-10-CM | POA: Diagnosis not present

## 2018-10-18 DIAGNOSIS — D320 Benign neoplasm of cerebral meninges: Secondary | ICD-10-CM

## 2018-10-18 LAB — MAGNESIUM: Magnesium: 2.1 mg/dL (ref 1.5–2.5)

## 2018-10-18 LAB — BASIC METABOLIC PANEL
BUN: 15 mg/dL (ref 6–23)
CO2: 31 mEq/L (ref 19–32)
Calcium: 9.9 mg/dL (ref 8.4–10.5)
Chloride: 101 mEq/L (ref 96–112)
Creatinine, Ser: 0.58 mg/dL (ref 0.40–1.20)
GFR: 100.55 mL/min (ref >60.00)
Glucose, Bld: 124 mg/dL — ABNORMAL HIGH (ref 70–99)
Potassium: 3.5 mEq/L (ref 3.5–5.1)
Sodium: 140 mEq/L (ref 135–145)

## 2018-10-18 MED ORDER — GADOBENATE DIMEGLUMINE 529 MG/ML IV SOLN
14.0000 mL | Freq: Once | INTRAVENOUS | Status: AC | PRN
  Administered 2018-10-18: 14 mL via INTRAVENOUS

## 2018-10-21 ENCOUNTER — Telehealth: Payer: Self-pay

## 2018-10-21 ENCOUNTER — Other Ambulatory Visit: Payer: Medicare Other

## 2018-10-21 NOTE — Telephone Encounter (Signed)
Close encounter 

## 2018-10-29 NOTE — Progress Notes (Signed)
NEUROLOGY FOLLOW UP OFFICE NOTE  Madison Erickson [id number]  HISTORY OF PRESENT ILLNESS: Madison Erickson is a 78 year old right-handed Caucasian woman with hypertension, hyperlipidemia, prediabetes, migraine and bronchiectasis status post partial lobectomy who follows up for transient ischemic attack and probable meningioma.    UPDATE:  Current medications: Aspirin 81 mg, Crestor 10 mg, Norvasc, HCTZ  She has not had any other events.  However, she developed numbness in the last 2 digits of her right hand and was diagnosed with ulnar neuropathy.   She had a follow up MRI of brain with and without contrast on 10/18/2018 which was personally reviewed and again demonstrated a dural-based enhancing mass overlying the left frontal lobe, stable in size (1.4 cm) and consistent with small meningioma.  [facility]: [facility] after presenting with visual disturbance and right sided numbness. At first, she noted a small prism in the right side of her vision, which caused her some difficulty when reading. She had trouble with talking and then developed right facial and hand numbness. She had some word-finding difficulty which made her speech sound garbled. Symptoms lasted 15 minutes.No associated headache.MRI of brain was personally reviewed andmoderate chronic small vessel ischemic changes and 15 mm left frontal dural nodulebut did not demonstrate any acute infarct or bleeding. MRA of head revealed no large vessel stenosis or occlusion. Carotid ultrasound showed no hemodynamically significant ICA stenosis. EKG and telemetryshowed sinus rhythmand no atrial fibrillation. Echocardiogram was not performed. LDL was 60. Hgb A1c was 5.9. She was discharged on ASA 81mg  daily and continued on Crestor 10mg  daily. She had an outpatient echocardiogram on 10/18/17, which showed EF 55-60% with grade 1 diastolic dysfunction.  She has remote history of  migraines 20s to 16s. Her migraines preceded by right facial and hand numbness. One time, it was associated with speech difficulty. Associated headaches were not too severe.  PAST MEDICAL HISTORY: Past Medical History:  Diagnosis Date  . Abnormal LFTs    fatty liver on [place] MRI faay liver and hemangioma 2008 biopsy 2011  . Asthma    allergy  . Benign tumor   . Bladder polyps   . Blood transfusion 1969  . Blood transfusion abn reaction or complication, no procedure mishap   . Cancer (HCC)    squameous cell lsft leg hx  . Colon polyp 2009   Tubulovillous adenoma   . Diverticulosis of colon   . Eczema   . Fatty liver disease, nonalcoholic    confirmed biopsy 2011 mild no fibrosis   . GERD (gastroesophageal reflux disease)   . Hematuria    urethral polyps  . HT (hammer toe)   . Hypertension   . Kidney disease    nephritis as  child. polpys, hematuria  . Lichen sclerosus    gyne care topical steroids  . Migraines    hx migraines  . Osteoarthritis   . Shortness of breath    partial lower rt lung lobectomy.  . Status post partial lobectomy of lung    for bronchiesctesis  . Urinary incontinence   . UTI (lower urinary tract infection)     MEDICATIONS: Current Outpatient Medications on File Prior to Visit  Medication Sig Dispense Refill  . amLODipine (NORVASC) 5 MG tablet Take 1 tablet (5 mg total) by mouth daily. 90 tablet 3  . aspirin EC 81 MG EC tablet Take 1 tablet (81 mg total) by mouth daily. 90 tablet 0  . cetirizine (ZYRTEC) 10 MG  tablet Take 10 mg by mouth daily.      . clobetasol (TEMOVATE) 0.05 % cream Apply 1 application topically 2 (two) times daily.     . hydrochlorothiazide (HYDRODIURIL) 25 MG tablet Take 1 tablet (25 mg total) by mouth daily. 90 tablet 3  . metoprolol succinate (TOPROL XL) 50 MG 24 hr tablet TAKE 1 TABLET DAILY WITH OR IMMEDIATELY FOLLOWING A MEAL 90 tablet 3  . METRONIDAZOLE, TOPICAL, 0.75 % LOTN Apply 1 application topically See admin  instructions. Apply to face once a day    . rosuvastatin (CRESTOR) 10 MG tablet TAKE 1 TABLET DAILY 90 tablet 3   No current facility-administered medications on file prior to visit.     ALLERGIES: Allergies  Allergen Reactions  . Atorvastatin Other (See Comments)    Muscle aches  . Sulfamethoxazole Other (See Comments)    Could never take this because it negatively affected her "white cells and kidneys" (had Bright's Disese)  . Tape Other (See Comments)    Can tolerate paper tape; is sensitive    FAMILY HISTORY: Family History  Problem Relation Age of Onset  . Cancer Mother        lung  . Heart disease Mother   . Diabetes Paternal Grandfather   . Cancer Paternal Grandfather        bladder  . Mental retardation Other   . Breast cancer Neg Hx   .  SOCIAL HISTORY: Social History   Socioeconomic History  . Marital status: Married    Spouse name: Not on file  . Number of children: Not on file  . Years of education: Not on file  . Highest education level: Not on file  Occupational History  . Not on file  Social Needs  . Financial resource strain: Not on file  . Food insecurity    Worry: Not on file    Inability: Not on file  . Transportation needs    Medical: Not on file    Non-medical: Not on file  Tobacco Use  . Smoking status: Never Smoker  . Smokeless tobacco: Never Used  . Tobacco comment: wine q 2-3 weeks  Substance and Sexual Activity  . Alcohol use: Yes    Alcohol/week: 0.0 standard drinks    Comment: 4-5 per year   . Drug use: No  . Sexual activity: Not on file    Comment: hysterectomy  Lifestyle  . Physical activity    Days per week: Not on file    Minutes per session: Not on file  . Stress: Not on file  Relationships  . Social Herbalist on phone: Not on file    Gets together: Not on file    Attends religious service: Not on file    Active member of club or organization: Not on file    Attends meetings of clubs or organizations:  Not on file    Relationship status: Not on file  . Intimate partner violence    Fear of current or ex partner: Not on file    Emotionally abused: Not on file    Physically abused: Not on file    Forced sexual activity: Not on file  Other Topics Concern  . 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[employer]   Married   Alcohol occasional social   Household of 2   Caffeine   Bereaved parent   Single story home   [place]  degree     REVIEW OF SYSTEMS: Constitutional: No fevers, chills, or sweats, no generalized fatigue, change in appetite Eyes: No visual changes, double vision, eye pain Ear, nose and throat: No hearing loss, ear pain, nasal congestion, sore throat Cardiovascular: No chest pain, palpitations Respiratory:  No shortness of breath at rest or with exertion, wheezes GastrointestinaI: No nausea, vomiting, diarrhea, abdominal pain, fecal incontinence Genitourinary:  No dysuria, urinary retention or frequency Musculoskeletal:  No neck pain, back pain Integumentary: No rash, pruritus, skin lesions Neurological: as above Psychiatric: No depression, insomnia, anxiety Endocrine: No palpitations, fatigue, diaphoresis, mood swings, change in appetite, change in weight, increased thirst Hematologic/Lymphatic:  No purpura, petechiae. Allergic/Immunologic: no itchy/runny eyes, nasal congestion, recent allergic reactions, rashes  PHYSICAL EXAM: Blood pressure 130/70, pulse 78, temperature 98 F (36.7 C), height 5\' 2"  (1.575 m), weight 153 lb (69.4 kg), SpO2 98 %. General: No acute distress.  Patient appears well-groomed.   Head:  Normocephalic/atraumatic Eyes:  Fundi examined but not visualized Neck: supple, no paraspinal tenderness, full range of motion Heart:  Regular rate and rhythm Lungs:  Clear to auscultation bilaterally Back: No paraspinal tenderness Neurological Exam: alert and oriented to person, place, and time. Attention span and  concentration intact, recent and remote memory intact, fund of knowledge intact.  Speech fluent and not dysarthric, language intact.  CN II-XII intact. Bulk and tone normal, muscle strength 5/5 throughout.  Sensation to light touch, temperature and vibration intact.  Deep tendon reflexes 2+ throughout, toes downgoing.  Finger to nose and heel to shin testing intact.  Gait normal, Romberg negative.  IMPRESSION: 1.  Questionable transient ischemic attack versus migraine with aura.  Symptoms similar to her previous migraine from decades ago.  However, given her age and stroke risk factors, I would err on the side of caution and treat for secondary stroke prevention. 2.  small intracranial meningioma, stable 3.  Hypertension 4.  Hyperlipidemia  PLAN: 1.  ASA 81mg  daily for secondary stroke prevention. 2.  Crestor 10 mg daily (LDL goal less than 70) 3.  Blood pressure control 4.  No need to repeat routine MRI unless patient has new neurologic symptoms 5.  Otherwise, follow up as needed.  15 minutes spent face to face with patient, over 50% spent discussing MRI results and management.   Metta Clines, DO  CC: Shanon Ace, MD

## 2018-10-31 ENCOUNTER — Other Ambulatory Visit: Payer: Self-pay

## 2018-10-31 ENCOUNTER — Encounter: Payer: Self-pay | Admitting: Neurology

## 2018-10-31 ENCOUNTER — Ambulatory Visit (INDEPENDENT_AMBULATORY_CARE_PROVIDER_SITE_OTHER): Payer: Medicare Other | Admitting: Neurology

## 2018-10-31 VITALS — BP 130/70 | HR 78 | Temp 98.0°F | Ht 62.0 in | Wt 153.0 lb

## 2018-10-31 DIAGNOSIS — D320 Benign neoplasm of cerebral meninges: Secondary | ICD-10-CM

## 2018-10-31 DIAGNOSIS — G459 Transient cerebral ischemic attack, unspecified: Secondary | ICD-10-CM | POA: Diagnosis not present

## 2018-10-31 NOTE — Patient Instructions (Addendum)
1.  Continue aspirin 81mg  daily and management for cholesterol and blood pressure 2.  I don't think we need to repeat imaging of the small meningioma unless you have new symptoms such as headaches, visual disturbance, etc) 3.  Follow up as needed.

## 2018-11-20 DIAGNOSIS — Z01411 Encounter for gynecological examination (general) (routine) with abnormal findings: Secondary | ICD-10-CM | POA: Diagnosis not present

## 2018-11-20 DIAGNOSIS — L900 Lichen sclerosus et atrophicus: Secondary | ICD-10-CM | POA: Diagnosis not present

## 2018-11-20 DIAGNOSIS — Z01419 Encounter for gynecological examination (general) (routine) without abnormal findings: Secondary | ICD-10-CM | POA: Diagnosis not present

## 2018-11-26 ENCOUNTER — Other Ambulatory Visit: Payer: Self-pay

## 2018-11-26 ENCOUNTER — Ambulatory Visit
Admission: RE | Admit: 2018-11-26 | Discharge: 2018-11-26 | Disposition: A | Discharge status: Home / Self Care | Payer: Medicare Other | Source: Ambulatory Visit | Attending: Obstetrics and Gynecology | Admitting: Obstetrics and Gynecology

## 2018-11-26 DIAGNOSIS — Z1231 Encounter for screening mammogram for malignant neoplasm of breast: Secondary | ICD-10-CM

## 2018-11-28 ENCOUNTER — Other Ambulatory Visit: Payer: Self-pay | Admitting: Obstetrics and Gynecology

## 2018-11-28 DIAGNOSIS — R928 Other abnormal and inconclusive findings on diagnostic imaging of breast: Secondary | ICD-10-CM

## 2018-12-02 ENCOUNTER — Other Ambulatory Visit: Payer: Self-pay

## 2018-12-02 ENCOUNTER — Ambulatory Visit
Admission: RE | Admit: 2018-12-02 | Discharge: 2018-12-02 | Disposition: A | Discharge status: Home / Self Care | Payer: Medicare Other | Source: Ambulatory Visit | Attending: Obstetrics and Gynecology | Admitting: Obstetrics and Gynecology

## 2018-12-02 DIAGNOSIS — R928 Other abnormal and inconclusive findings on diagnostic imaging of breast: Secondary | ICD-10-CM

## 2018-12-02 DIAGNOSIS — N6001 Solitary cyst of right breast: Secondary | ICD-10-CM | POA: Diagnosis not present

## 2018-12-02 DIAGNOSIS — R922 Inconclusive mammogram: Secondary | ICD-10-CM | POA: Diagnosis not present

## 2019-02-06 ENCOUNTER — Ambulatory Visit: Payer: Medicare Other

## 2019-02-06 ENCOUNTER — Ambulatory Visit: Payer: Medicare Other | Attending: Internal Medicine

## 2019-02-06 DIAGNOSIS — Z23 Encounter for immunization: Secondary | ICD-10-CM | POA: Insufficient documentation

## 2019-02-06 NOTE — Progress Notes (Signed)
   Covid-19 Vaccination Clinic  Name:  Madison Erickson    MRN: [id number] DOB: 1940-11-30  02/06/2019  [name] was observed post Covid-19 immunization for 15 minutes without incidence. She was provided with Vaccine Information Sheet and instruction to access the V-Safe system.   [name] was instructed to call 911 with any severe reactions post vaccine: [name] Difficulty breathing  . Swelling of your face and throat  . A fast heartbeat  . A bad rash all over your body  . Dizziness and weakness    Immunizations Administered    Name Date Dose VIS Date Route   Pfizer COVID-19 Vaccine 02/06/2019 10:37 AM 0.3 mL 01/03/2019 Intramuscular   Manufacturer: [employer], [employer]   Lot: [id number]   [place]: [id number]

## 2019-02-26 ENCOUNTER — Ambulatory Visit: Payer: Medicare Other | Attending: Internal Medicine

## 2019-02-26 DIAGNOSIS — Z23 Encounter for immunization: Secondary | ICD-10-CM | POA: Insufficient documentation

## 2019-02-26 NOTE — Progress Notes (Signed)
   Covid-19 Vaccination Clinic  Name:  DONICA KORBEL    MRN: [id number] DOB: [birth year]  02/26/2019  Ms. Raga was observed post Covid-19 immunization for 15 minutes without incidence. She was provided with Vaccine Information Sheet and instruction to access the V-Safe system.   Ms. Lawrie was instructed to call 911 with any severe reactions post vaccine: [name] Difficulty breathing  . Swelling of your face and throat  . A fast heartbeat  . A bad rash all over your body  . Dizziness and weakness    Immunizations Administered    Name Date Dose VIS Date Route   Pfizer COVID-19 Vaccine 02/26/2019  9:31 AM 0.3 mL 01/03/2019 Intramuscular   Manufacturer: [place]   Lot: [id number]   [place]: [id number]

## 2019-03-07 ENCOUNTER — Telehealth (INDEPENDENT_AMBULATORY_CARE_PROVIDER_SITE_OTHER): Payer: Medicare Other | Admitting: Internal Medicine

## 2019-03-07 ENCOUNTER — Other Ambulatory Visit: Payer: Self-pay

## 2019-03-07 ENCOUNTER — Encounter: Payer: Self-pay | Admitting: Internal Medicine

## 2019-03-07 ENCOUNTER — Telehealth: Payer: Self-pay | Admitting: Internal Medicine

## 2019-03-07 ENCOUNTER — Other Ambulatory Visit (INDEPENDENT_AMBULATORY_CARE_PROVIDER_SITE_OTHER): Payer: Medicare Other

## 2019-03-07 DIAGNOSIS — R300 Dysuria: Secondary | ICD-10-CM

## 2019-03-07 DIAGNOSIS — R3989 Other symptoms and signs involving the genitourinary system: Secondary | ICD-10-CM

## 2019-03-07 DIAGNOSIS — Z87440 Personal history of urinary (tract) infections: Secondary | ICD-10-CM | POA: Diagnosis not present

## 2019-03-07 DIAGNOSIS — R310 Gross hematuria: Secondary | ICD-10-CM | POA: Diagnosis not present

## 2019-03-07 LAB — URINALYSIS, ROUTINE W REFLEX MICROSCOPIC
Nitrite: POSITIVE — AB
Specific Gravity, Urine: >=1.03 — AB (ref 1.000–1.030)
Total Protein, Urine: >=300 — AB
Urine Glucose: 100 — AB
Urobilinogen, UA: 1 (ref 0.0–1.0)
pH: 6.5 (ref 5.0–8.0)

## 2019-03-07 MED ORDER — NITROFURANTOIN MONOHYD MACRO 100 MG PO CAPS: 100.0000 mg | ORAL_CAPSULE | Freq: Two times a day (BID) | ORAL | 0 refills | Status: DC

## 2019-03-07 NOTE — Telephone Encounter (Signed)
Called pt and she is dropping off a urine at [facility] and then set up for telephone visit

## 2019-03-07 NOTE — Telephone Encounter (Signed)
Pt is having blood in urine and is painful. Pt is not sure if medication can be prescribed or if she needs to come in for labs? Thanks  Pt's [phone]

## 2019-03-07 NOTE — Progress Notes (Signed)
Virtual Visit via Telephone Note  I connected with@ on 03/07/19 at  2:00 PM EST by telephone and verified that I am speaking with the correct person using two identifiers.   I discussed the limitations, risks, security and privacy concerns of performing an evaluation and management service by telephone and the limited availability of in person appointments. tThere may be a patient responsible charge related to this service. The patient expressed understanding and agreed to proceed.  Location patient: home Location provider: work  office Participants present for the call: patient, provider Patient did not have a visit in the prior 7 days to address this/these issue(s).   History of Present Illness: Madison Erickson  battling mild urine ary irritability for a few day and this am had stinging and pain with urination   Taking  azo now no fever abd pain  Hx of recurrent uti  Last one over 6 mos ago?   Past hx of  uro evaluations for  Persistent  Micro hematuria  ( has hd of bladder polyps)   no fever  sytemic sx     Has  hda 2 covid 19 immunizations  Observations/Objective: Patient sounds personable and well on the phone. I do not appreciate any SOB. Speech and thought processing are grossly intact. Patient reported vitals: Lab Results  Component Value Date   WBC 10.5 09/25/2018   HGB 14.3 09/25/2018   HCT 41.4 09/25/2018   PLT 257.0 09/25/2018   GLUCOSE 124 (H) 10/18/2018   CHOL 110 09/25/2018   TRIG 111.0 09/25/2018   HDL 33.60 (L) 09/25/2018   LDLDIRECT 201.9 10/26/2008   LDLCALC 54 09/25/2018   ALT 20 09/25/2018   AST 20 09/25/2018   NA 140 10/18/2018   K 3.5 10/18/2018   CL 101 10/18/2018   CREATININE 0.58 10/18/2018   BUN 15 10/18/2018   CO2 31 10/18/2018   TSH 1.84 09/25/2018   INR 1.02 10/12/2017   HGBA1C 6.2 09/25/2018   Urinalysis    Component Value Date/Time   COLORURINE YELLOW 03/07/2019 1232   APPEARANCEUR CLEAR 03/07/2019 1232   LABSPEC >=1.030 (A)  03/07/2019 1232   PHURINE 6.5 03/07/2019 1232   GLUCOSEU 100 (A) 03/07/2019 1232   HGBUR LARGE (A) 03/07/2019 1232   HGBUR 2+ 10/29/2009 1423   BILIRUBINUR MODERATE (A) 03/07/2019 1232   BILIRUBINUR n 06/28/2018 1335   KETONESUR TRACE (A) 03/07/2019 1232   PROTEINUR Positive (A) 06/28/2018 1335   PROTEINUR NEGATIVE 10/12/2017 1509   UROBILINOGEN 1.0 03/07/2019 1232   NITRITE POSITIVE (A) 03/07/2019 1232   LEUKOCYTESUR MODERATE (A) 03/07/2019 1232     Assessment and Plan:  Suspected UTI  Gross hematuria  History of recurrent UTIs   Follow Up Instructions: Most likely last e coli pan sensitive   Tolerated macrobid in past ( one med make her nauseous)  consdir fu ua if lood persists but seem from uti  99441 5-10 99442 11-20 94443 21-30 I did not refer this patient for an OV in the next 24 hours for this/these issue(s).  I discussed the assessment and treatment plan with the patient. The patient was provided an opportunity to ask questions and answered. The patient agreed with the plan and demonstrated an understanding of the instructions.   The patient was advised to call back or seek an in-person evaluation if the symptoms worsen or if the condition fails to improve as anticipated.  I provided 12  minutes of non-face-to-face time during this encounter. Return if symptoms  worsen or fail to improve.  Shanon Ace, MD

## 2019-03-09 LAB — URINE CULTURE
MICRO NUMBER:: 10146717
SPECIMEN QUALITY:: ADEQUATE

## 2019-03-10 NOTE — Progress Notes (Signed)
urine culture shows e coli  sensitive to medication given . Should resolve with current treatment .FU if not better. 

## 2019-04-21 ENCOUNTER — Telehealth (INDEPENDENT_AMBULATORY_CARE_PROVIDER_SITE_OTHER): Payer: Medicare Other | Admitting: Internal Medicine

## 2019-04-21 ENCOUNTER — Encounter: Payer: Self-pay | Admitting: Internal Medicine

## 2019-04-21 ENCOUNTER — Other Ambulatory Visit (INDEPENDENT_AMBULATORY_CARE_PROVIDER_SITE_OTHER): Payer: Medicare Other

## 2019-04-21 VITALS — Temp 97.7°F | Ht 62.0 in | Wt 153.0 lb

## 2019-04-21 DIAGNOSIS — R509 Fever, unspecified: Secondary | ICD-10-CM | POA: Diagnosis not present

## 2019-04-21 DIAGNOSIS — R3989 Other symptoms and signs involving the genitourinary system: Secondary | ICD-10-CM

## 2019-04-21 DIAGNOSIS — Z87440 Personal history of urinary (tract) infections: Secondary | ICD-10-CM

## 2019-04-21 LAB — URINALYSIS, ROUTINE W REFLEX MICROSCOPIC
Bilirubin Urine: NEGATIVE
Ketones, ur: NEGATIVE
Leukocytes,Ua: NEGATIVE
Nitrite: NEGATIVE
Specific Gravity, Urine: 1.02 (ref 1.000–1.030)
Total Protein, Urine: NEGATIVE
Urine Glucose: NEGATIVE
Urobilinogen, UA: 0.2 (ref 0.0–1.0)
pH: 6.5 (ref 5.0–8.0)

## 2019-04-21 NOTE — Progress Notes (Signed)
Urine does not look infected  Await culture  Please contact tomorrwo about how doing  ie if fever  cough etc  this may be a viral infection  but will obsere closely

## 2019-04-21 NOTE — Progress Notes (Signed)
Virtual Visit via Telephone Note  I connected with@ on 04/21/19 at  9:30 AM EDT by telephone and verified that I am speaking with the correct person using two identifiers.   I discussed the limitations, risks, security and privacy concerns of performing an evaluation and management service by telephone and the limited availability of in person appointments. tThere may be a patient responsible charge related to this service. The patient expressed understanding and agreed to proceed.  Location patient: home Location provider: work  office Participants present for the call: patient, provider Patient did not have a visit in the prior 7 days to address this/these issue(s). She doesnt have a video smart phone  And cannot come to office with current sx   History of Present Illness: Madison Erickson  Presents with sx   Of concern although seems better today   Was at beach last week isolated ( owns property and never went out  And developed chills for 1.5 hours with fever 102.9 and then took motrin tylenol and next day 99   Today 97.7 and 98.6 No flank pain but did take azo once   No hematuria gross.   Has a baseline  drainage and ocass cough dry   May be slightly worse but no cp sob  And no one else is sick   Had second pfizer vaccine  Feb 3rd   Completed so doesn't think exposed or have covid   had uti in feb e coli rx macrobid  Observations/Objective: Patient sounds personable and well on the phone. No coughing on interview  ocass throat clearing  I do not appreciate any SOB. Speech and thought processing are grossly intact. Patient reported vitals: temp 98.6 Lab Results  Component Value Date   WBC 10.5 09/25/2018   HGB 14.3 09/25/2018   HCT 41.4 09/25/2018   PLT 257.0 09/25/2018   GLUCOSE 124 (H) 10/18/2018   CHOL 110 09/25/2018   TRIG 111.0 09/25/2018   HDL 33.60 (L) 09/25/2018   LDLDIRECT 201.9 10/26/2008   LDLCALC 54 09/25/2018   ALT 20 09/25/2018   AST 20 09/25/2018   NA 140  10/18/2018   K 3.5 10/18/2018   CL 101 10/18/2018   CREATININE 0.58 10/18/2018   BUN 15 10/18/2018   CO2 31 10/18/2018   TSH 1.84 09/25/2018   INR 1.02 10/12/2017   HGBA1C 6.2 09/25/2018    Assessment and Plan: Low grade fever - but had one episode of hihg fever and chills  subsided   at risk for uti pyelo and  pna  but not covid at this time - Plan: Urinalysis, Routine w reflex microscopic, Urine Culture  History of recurrent UTIs - Plan: Urinalysis, Routine w reflex microscopic, Urine Culture  Suspected UTI - Plan: Urinalysis, Routine w reflex microscopic, Urine Culture  Says doesn't feel bad at this time  No sob cp  To get ua and cx   Follow Up Instructions: Get lab ua and cx and go from there if resp sx then poss appt resp clinic for in  Person c xray etc       99441 5-10 99442 11-20 94443 21-30 I did not refer this patient for an OV in the next 24 hours for this/these issue(s).  I discussed the assessment and treatment plan with the patient. The patient was provided an opportunity to ask questions and answered. The patient agreed with the plan and demonstrated an understanding of the instructions.   The patient was advised to call  back or seek an in-person evaluation if the symptoms worsen or if the condition fails to improve as anticipated.  I provided 18 minutes minutes of non-face-to-face time during this encounter. Return for urine test cx and observe for fu .  Shanon Ace, MD

## 2019-04-21 NOTE — Progress Notes (Signed)
NOTED

## 2019-04-22 ENCOUNTER — Telehealth: Payer: Self-pay | Admitting: Internal Medicine

## 2019-04-22 NOTE — Telephone Encounter (Signed)
Called pt this afternoon please see result noted for update

## 2019-04-22 NOTE — Telephone Encounter (Signed)
The patient said she was advised to call back or seek an in-person evaluation if the symptoms worsen or if the condition fails to improve as anticipated.   The patient said she has been coughing more, but no fever. --contact [phone]

## 2019-04-23 LAB — URINE CULTURE
MICRO NUMBER:: 10302280
SPECIMEN QUALITY:: ADEQUATE

## 2019-04-23 NOTE — Progress Notes (Signed)
Culture does not show uti

## 2019-06-09 ENCOUNTER — Ambulatory Visit (INDEPENDENT_AMBULATORY_CARE_PROVIDER_SITE_OTHER): Payer: Medicare Other | Admitting: Internal Medicine

## 2019-06-09 ENCOUNTER — Other Ambulatory Visit: Payer: Self-pay

## 2019-06-09 ENCOUNTER — Ambulatory Visit (INDEPENDENT_AMBULATORY_CARE_PROVIDER_SITE_OTHER)
Admission: RE | Admit: 2019-06-09 | Discharge: 2019-06-09 | Disposition: A | Discharge status: Home / Self Care | Payer: Medicare Other | Source: Ambulatory Visit | Attending: Internal Medicine | Admitting: Internal Medicine

## 2019-06-09 ENCOUNTER — Telehealth: Payer: Self-pay | Admitting: Internal Medicine

## 2019-06-09 ENCOUNTER — Encounter: Payer: Self-pay | Admitting: Internal Medicine

## 2019-06-09 ENCOUNTER — Other Ambulatory Visit (INDEPENDENT_AMBULATORY_CARE_PROVIDER_SITE_OTHER): Payer: Medicare Other

## 2019-06-09 VITALS — BP 120/74 | HR 79 | Temp 97.7°F | Ht 62.0 in | Wt 152.4 lb

## 2019-06-09 DIAGNOSIS — R05 Cough: Secondary | ICD-10-CM

## 2019-06-09 DIAGNOSIS — R350 Frequency of micturition: Secondary | ICD-10-CM

## 2019-06-09 DIAGNOSIS — Z87440 Personal history of urinary (tract) infections: Secondary | ICD-10-CM

## 2019-06-09 DIAGNOSIS — R059 Cough, unspecified: Secondary | ICD-10-CM

## 2019-06-09 DIAGNOSIS — Z902 Acquired absence of lung [part of]: Secondary | ICD-10-CM

## 2019-06-09 LAB — URINALYSIS, ROUTINE W REFLEX MICROSCOPIC
Bilirubin Urine: NEGATIVE
Ketones, ur: NEGATIVE
Nitrite: NEGATIVE
Specific Gravity, Urine: 1.025 (ref 1.000–1.030)
Urine Glucose: NEGATIVE
Urobilinogen, UA: 1 (ref 0.0–1.0)
pH: 6.5 (ref 5.0–8.0)

## 2019-06-09 MED ORDER — AMOXICILLIN-POT CLAVULANATE 875-125 MG PO TABS: 1.0000 | ORAL_TABLET | Freq: Two times a day (BID) | ORAL | 0 refills | Status: AC

## 2019-06-09 NOTE — Progress Notes (Signed)
Xray chest is stable no acute findings Please send in augmentin 875 1 po bid for 5 days  disp 10

## 2019-06-09 NOTE — Progress Notes (Signed)
Urine looks infected  planning augmentin medication If still having urinary sx t after the antibiotic  hen we should repeat UA with micro and get a urine culture   contact us if ongoing  urine sx

## 2019-06-09 NOTE — Telephone Encounter (Signed)
Pt is calling in stating she would like to know if she is suppose to be getting an prescription for augmentin and is so what pharmacy will it be called in to so she can send her husband to go and get it.

## 2019-06-09 NOTE — Progress Notes (Signed)
This visit occurred during the SARS-CoV-2 public health emergency.  Safety protocols were in place, including screening questions prior to the visit, additional usage of staff PPE, and extensive cleaning of exam room while observing appropriate contact time as indicated for disinfecting solutions.    Chief Complaint  Patient presents with  . Follow-up    Had a low grade fever starting last Thursday, today no low grade fever, has yellow drainage, wheezing, started with a sore throat    HPI: Madison Erickson 80 y.o. come in for SDA  Known   resp issues hx  Of lobectomy    Martin Majestic out to dinner last week 8 days  Ago  And had sore throat    Drainage all year.   4 days  ago had 99+  Temp  Not over 100  But checking in case .  mneck ln swollen and    Had family member with sneezing and runny nose .  Felt to be allergy  hanst been out much to get contagious diseaes. utd on covid vaccine   Coughing   Some   Isolating .    Some  If laying  downfrom sinus  Hear in layin g  down in chest .  On the  Right ( are area of lobectomy)Little green yellow.  Last cxray was 2018 in system   Has had ovid vaccine ROS: See pertinent positives and negatives per HPI. Has had ulnar neuropathy compression sx ome and go  Right arm ok now  Asks about lyme disease  ocass frequency not sure if could have uti   Past Medical History:  Diagnosis Date  . Abnormal LFTs    fatty liver on [place] MRI faay liver and hemangioma 2008 biopsy 2011  . Asthma    allergy  . Benign tumor   . Bladder polyps   . Blood transfusion 1969  . Blood transfusion abn reaction or complication, no procedure mishap   . Cancer (HCC)    squameous cell lsft leg hx  . Colon polyp 2009   Tubulovillous adenoma   . Diverticulosis of colon   . Eczema   . Fatty liver disease, nonalcoholic    confirmed biopsy 2011 mild no fibrosis   . GERD (gastroesophageal reflux disease)   . Hematuria    urethral polyps  . HT (hammer toe)   . Hypertension   . Kidney  disease    nephritis as  child. polpys, hematuria  . Lichen sclerosus    gyne care topical steroids  . Migraines    hx migraines  . Osteoarthritis   . Shortness of breath    partial lower rt lung lobectomy.  . Status post partial lobectomy of lung    for bronchiesctesis  . Urinary incontinence   . UTI (lower urinary tract infection)     Family History  Problem Relation Age of Onset  . Cancer Mother        lung  . Heart disease Mother   . Diabetes Paternal Grandfather   . Cancer Paternal Grandfather        bladder  . Mental retardation Other   . Breast cancer Neg Hx     Social History   Socioeconomic History  . Marital status: Married    Spouse name: Not on file  . Number of children: Not on file  . Years of education: Not on file  . Highest education level: Not on file  Occupational History  . Not on file  Tobacco  Use  . Smoking status: Never Smoker  . Smokeless tobacco: Never Used  . Tobacco comment: wine q 2-3 weeks  Substance and Sexual Activity  . Alcohol use: Yes    Alcohol/week: 0.0 standard drinks    Comment: 4-5 per year   . Drug use: No  . Sexual activity: Not on file    Comment: hysterectomy  Other Topics Concern  . 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 Difficulty of Paying Living Expenses:   Food Insecurity:   . Worried About Charity fundraiser in the Last Year:   . Arboriculturist in the Last Year:   Transportation Needs:   . Film/video editor (Medical):   [name] Lack of Transportation (Non-Medical):   Physical Activity:   . Days of Exercise per Week:   . Minutes of Exercise per Session:   Stress:   . Feeling of Stress :   Social Connections:   . Frequency of Communication  with Friends and Family:   . Frequency of Social Gatherings with Friends and Family:   . Attends Religious Services:   . Active Member of Clubs or Organizations:   . Attends Archivist Meetings:   [name] Marital Status:     Outpatient Medications Prior to Visit  Medication Sig Dispense Refill  . amLODipine (NORVASC) 5 MG tablet Take 1 tablet (5 mg total) by mouth daily. 90 tablet 3  . aspirin EC 81 MG EC tablet Take 1 tablet (81 mg total) by mouth daily. 90 tablet 0  . cetirizine (ZYRTEC) 10 MG tablet Take 10 mg by mouth daily.      . clobetasol (TEMOVATE) 0.05 % cream Apply 1 application topically as needed.     . hydrochlorothiazide (HYDRODIURIL) 25 MG tablet Take 1 tablet (25 mg total) by mouth daily. 90 tablet 3  . metoprolol succinate (TOPROL XL) 50 MG 24 hr tablet TAKE 1 TABLET DAILY WITH OR IMMEDIATELY FOLLOWING A MEAL 90 tablet 3  . METRONIDAZOLE, TOPICAL, 0.75 % LOTN Apply 1 application topically See admin instructions. Apply to face once a day    . rosuvastatin (CRESTOR) 10 MG tablet TAKE 1 TABLET DAILY 90 tablet 3   No facility-administered medications prior to visit.     EXAM:  BP 120/74   Pulse 79   Temp 97.7 F (36.5 C) (Temporal)   Ht 5\' 2"  (1.575 m)   Wt 152 lb 6.4 oz (69.1 kg)   SpO2 98%   BMI 27.87 kg/m   Body mass index is 27.87 kg/m.  GENERAL: vitals reviewed and listed above, alert, oriented, appears well hydrated and in no acute distress HEENT: atraumatic, conjunctiva  clear, no obvious abnormalities on inspection of external nose and ears tmx clear  OP : nmasked  NECK: no obvious masses on inspection palpation  LUNGS: righ side loose musical wheeze   No rhonchi  Ok air movement   CV: HRRR, no clubbing cyanosis or  peripheral edema nl cap refill  MS: moves all extremities without noticeable focal  abnormality PSYCH: pleasant and cooperative, no obvious depression or anxiety Lab Results  Component Value Date  WBC 10.5 09/25/2018   HGB 14.3  09/25/2018   HCT 41.4 09/25/2018   PLT 257.0 09/25/2018   GLUCOSE 124 (H) 10/18/2018   CHOL 110 09/25/2018   TRIG 111.0 09/25/2018   HDL 33.60 (L) 09/25/2018   LDLDIRECT 201.9 10/26/2008   LDLCALC 54 09/25/2018   ALT 20 09/25/2018   AST 20 09/25/2018   NA 140 10/18/2018   K 3.5 10/18/2018   CL 101 10/18/2018   CREATININE 0.58 10/18/2018   BUN 15 10/18/2018   CO2 31 10/18/2018   TSH 1.84 09/25/2018   INR 1.02 10/12/2017   HGBA1C 6.2 09/25/2018   BP Readings from Last 3 Encounters:  06/09/19 120/74  10/31/18 130/70  09/27/18 122/64    ASSESSMENT AND PLAN:  Discussed the following assessment and plan:  Cough - Plan: DG Chest 2 View  Urinary frequency - Plan: Urinalysis, Routine w reflex microscopic  Status post partial lobectomy of lung - fro bronchiectasis  History of recurrent UTIs  -Patient advised to return or notify health care team  if  new concerns arise.  Patient Instructions  Get x ray and  Urine a [facility]  And will decide on next step  There is some mild wheezing on right and we may add antibiotic .  depending on results .     Standley Brooking. Samanvitha Germany M.D.

## 2019-06-09 NOTE — Telephone Encounter (Signed)
It has already been sent in

## 2019-06-09 NOTE — Patient Instructions (Signed)
Get x ray and  Urine a [facility]  And will decide on next step  There is some mild wheezing on right and we may add antibiotic .  depending on results .

## 2019-06-09 NOTE — Telephone Encounter (Signed)
Please see message.  Please advise. 

## 2019-06-10 ENCOUNTER — Other Ambulatory Visit: Payer: Self-pay

## 2019-06-10 DIAGNOSIS — R350 Frequency of micturition: Secondary | ICD-10-CM

## 2019-06-10 NOTE — Telephone Encounter (Signed)
I called patient from the result note yesterday and let her know that I have sent in the antibiotic to her pharmacy. Patient verbalized an understanding.

## 2019-06-16 ENCOUNTER — Other Ambulatory Visit: Payer: Self-pay

## 2019-06-16 ENCOUNTER — Other Ambulatory Visit (INDEPENDENT_AMBULATORY_CARE_PROVIDER_SITE_OTHER): Payer: Medicare Other

## 2019-06-16 DIAGNOSIS — R350 Frequency of micturition: Secondary | ICD-10-CM | POA: Diagnosis not present

## 2019-06-16 LAB — URINALYSIS, ROUTINE W REFLEX MICROSCOPIC
Bilirubin Urine: NEGATIVE
Ketones, ur: NEGATIVE
Leukocytes,Ua: NEGATIVE
Nitrite: NEGATIVE
Specific Gravity, Urine: 1.025 (ref 1.000–1.030)
Total Protein, Urine: NEGATIVE
Urine Glucose: NEGATIVE
Urobilinogen, UA: 0.2 (ref 0.0–1.0)
pH: 6 (ref 5.0–8.0)

## 2019-06-17 LAB — URINE CULTURE
MICRO NUMBER:: 10512042
Result:: NO GROWTH
SPECIMEN QUALITY:: ADEQUATE

## 2019-06-18 NOTE — Progress Notes (Signed)
Urine culture is no bacteria  and [place] is better

## 2019-09-18 DIAGNOSIS — M25511 Pain in right shoulder: Secondary | ICD-10-CM | POA: Diagnosis not present

## 2019-09-26 IMAGING — CR DG CHEST 2V
2 series · 2 of 2 positions shown · non-contrast
Comparison: 05/23/2016 and earlier.

CLINICAL DATA: 76-year-old female with fever onset last night.
Intermittent cough.

EXAM:
CHEST  2 VIEW

[w chest pa]
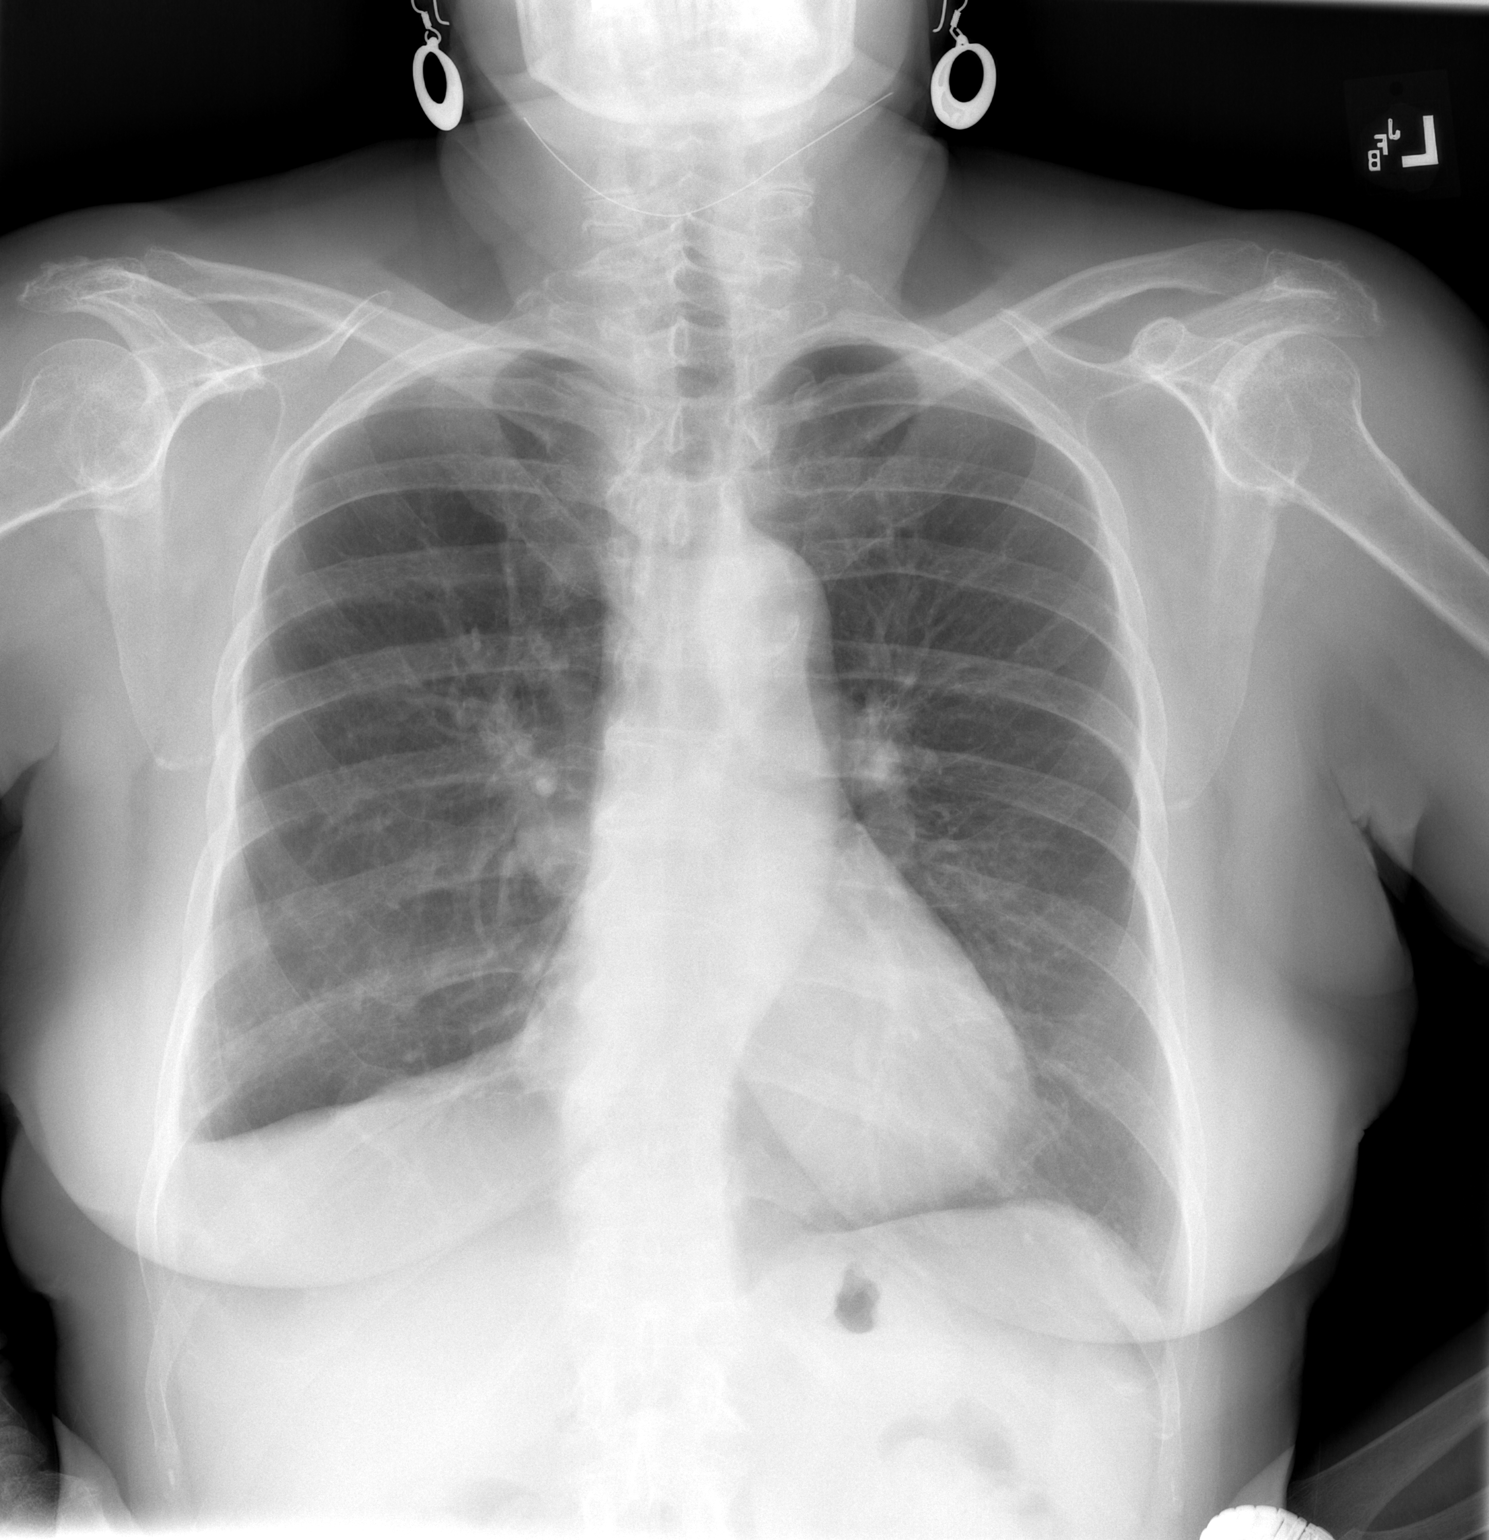

[w chest lat]
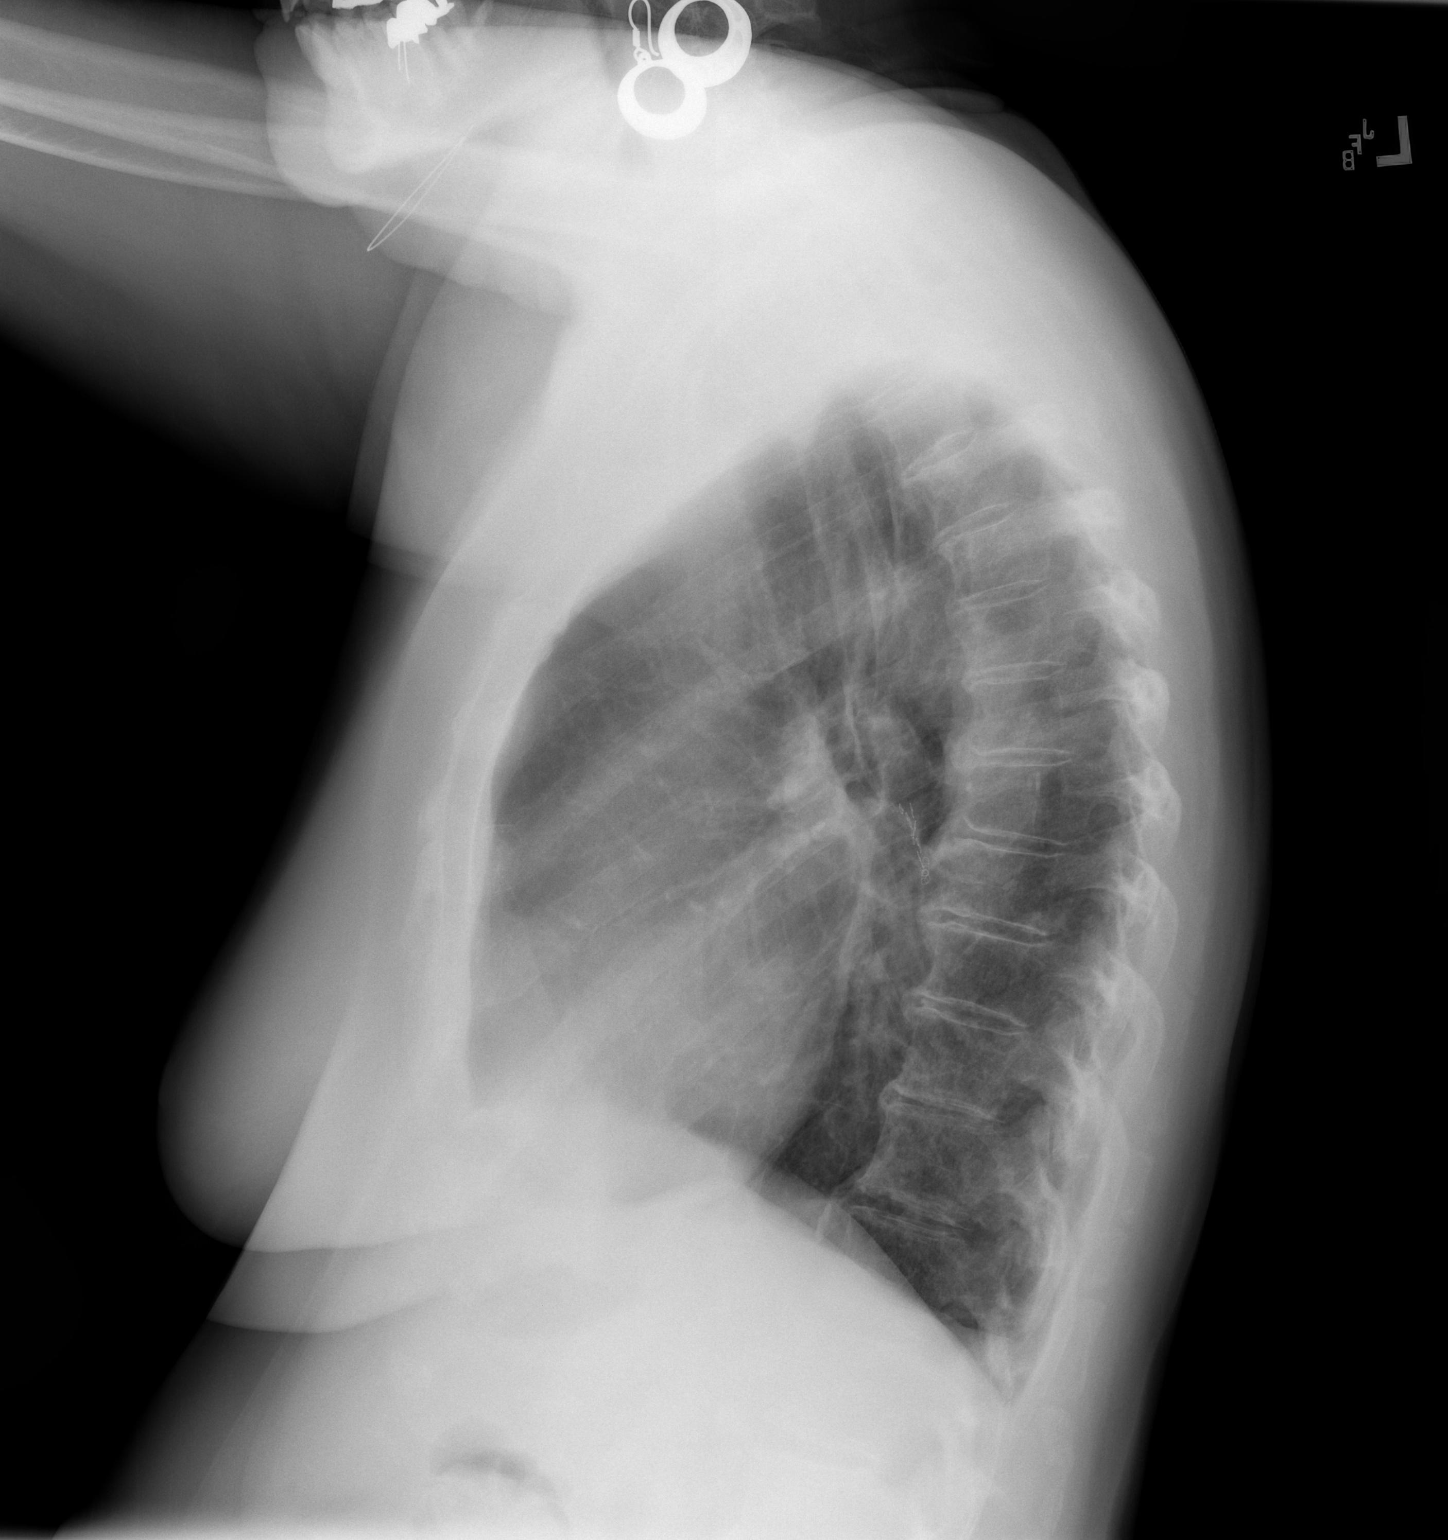

[2 of 2 positions shown; findings below may reference images not displayed]

FINDINGS: Stable lung volumes and chronic blunting of the right lateral
costophrenic angle. Stable cardiac size at the upper limits of
normal and mild tortuosity of the thoracic aorta. Other mediastinal
contours are within normal limits. Visualized tracheal air column is
within normal limits. No pneumothorax, pulmonary edema, pleural
effusion or acute pulmonary opacity. Some attenuation of upper lobe
bronchovascular markings raises the possibility of emphysema. No
acute osseous abnormality identified. Negative visible bowel gas
pattern.
IMPRESSION: No acute cardiopulmonary abnormality.

## 2019-10-10 DIAGNOSIS — Z23 Encounter for immunization: Secondary | ICD-10-CM | POA: Diagnosis not present

## 2019-10-21 ENCOUNTER — Telehealth: Payer: Self-pay | Admitting: Internal Medicine

## 2019-10-21 ENCOUNTER — Other Ambulatory Visit: Payer: Self-pay | Admitting: Obstetrics and Gynecology

## 2019-10-21 ENCOUNTER — Other Ambulatory Visit: Payer: Self-pay

## 2019-10-21 DIAGNOSIS — Z1231 Encounter for screening mammogram for malignant neoplasm of breast: Secondary | ICD-10-CM

## 2019-10-21 MED ORDER — AMLODIPINE BESYLATE 5 MG PO TABS: 5.0000 mg | ORAL_TABLET | Freq: Every day | ORAL | 0 refills | Status: DC

## 2019-10-21 MED ORDER — ROSUVASTATIN CALCIUM 10 MG PO TABS: ORAL_TABLET | ORAL | 0 refills | Status: DC

## 2019-10-21 MED ORDER — METOPROLOL SUCCINATE ER 50 MG PO TB24: ORAL_TABLET | ORAL | 0 refills | Status: DC

## 2019-10-21 MED ORDER — HYDROCHLOROTHIAZIDE 25 MG PO TABS: 25.0000 mg | ORAL_TABLET | Freq: Every day | ORAL | 0 refills | Status: DC

## 2019-10-21 NOTE — Telephone Encounter (Signed)
Patient is scheduled for CPE on 11/21/2019 and sent 90 day to her pharmacy requested. Patient verbalized an understanding.

## 2019-10-21 NOTE — Telephone Encounter (Signed)
amLODipine (NORVASC) 5 MG tablet  rosuvastatin (CRESTOR) 10 MG tablet  metoprolol succinate (TOPROL XL) 50 MG 24 hr tablet  hydrochlorothiazide (HYDRODIURIL) 25 MG tablet  [facility] [place] [place] - [address] Phone:  [phone]  Fax:  [phone]       Can you call these in for the year so she don't have to keep calling every month?  Please advise

## 2019-11-20 NOTE — Progress Notes (Signed)
Chief Complaint  Patient presents with  . Annual Exam    HPI: Madison Erickson 79 y.o. comes in today for yearly visit .Since last visit.   2 fingers numb  right   Ulnar  n Remote hx of shoulder .   Shoulder evalu (not neck)  Has a spur  Elbow  Ulnar never compression no weakness   BPok   Some leg cramps recently  No tia sx  May have been  migraine but they have cva type sx  aks about asa  Use    Has some bruise forarms easily on asa so ask about cutting down and recent usptf advice   No uti sx today but brought in ua  Spec   hasnt fu with uro in a while  Dr Roni Bread said would see her no gross hematuira or uti sx today   Due for colon    Health Maintenance  Topic Date Due  . TETANUS/TDAP  02/12/2022  . INFLUENZA VACCINE  Completed  . DEXA SCAN  Completed  . COVID-19 Vaccine  Completed  . Hepatitis C Screening  Completed  . PNA vac Low Risk Adult  Completed   Health Maintenance Review LIFESTYLE:  Exercise:  Active clean   No dedicated  exercise  Tobacco/ETS: no Alcohol:    Seldom  Sugar beverages:   Little   Diet coke  Sleep:  Has issues  tpold to take melatonin   . Uses tv.  Drug use: no HH: 2  No pets      Hearing: ok  Vision:  No limitations at present . Last eye check UTD  Safety:  Has smoke detector and wears seat belts. No excess sun exposure. Sees dentist regularly.  Memory: Felt to be good  , no concern from her or her family.  Depression: No anhedonia unusual crying or depressive symptoms  Nutrition: Eats well balanced diet; adequate calcium and vitamin D. No swallowing chewing problems.  Injury: no major injuries in the last six months.  Other healthcare providers:  Reviewed today .  Preventive parameters: up-to-date  Reviewed   ADLS:   There are no problems or need for assistance  driving, feeding, obtaining food, dressing, toileting and bathing, managing money using phone. She is independent.   ROS:  GEN/ HEENT: No fever, significant weight  changes sweats headaches vision problems hearing changes, CV/ PULM; No chest pain shortness of breath cough, syncope,edema  change in exercise tolerance. GI /GU: No adominal pain, vomiting, change in bowel habits. No blood in the stool. No significant GU symptoms. SKIN/HEME: ,no acute skin rashes suspicious lesions or bleeding. No lymphadenopathy, nodules, masses.  NEURO/ PSYCH:  No neurologic signs such as weakness numbness. No depression anxiety. IMM/ Allergy: No unusual infections.  Allergy .   REST of 12 system review negative except as per HPI   Past Medical History:  Diagnosis Date  . Abnormal LFTs    fatty liver on [place] MRI faay liver and hemangioma 2008 biopsy 2011  . Asthma    allergy  . Benign tumor   . Bladder polyps   . Blood transfusion 1969  . Blood transfusion abn reaction or complication, no procedure mishap   . Cancer (HCC)    squameous cell lsft leg hx  . Colon polyp 2009   Tubulovillous adenoma   . Diverticulosis of colon   . Eczema   . Fatty liver disease, nonalcoholic    confirmed biopsy 2011 mild no fibrosis   . GERD (  gastroesophageal reflux disease)   . Hematuria    urethral polyps  . HT (hammer toe)   . Hypertension   . Kidney disease    nephritis as  child. polpys, hematuria  . Lichen sclerosus    gyne care topical steroids  . Migraines    hx migraines  . Osteoarthritis   . Shortness of breath    partial lower rt lung lobectomy.  . Status post partial lobectomy of lung    for bronchiesctesis  . Urinary incontinence   . UTI (lower urinary tract infection)     Family History  Problem Relation Age of Onset  . Cancer Mother        lung  . Heart disease Mother   . Diabetes Paternal Grandfather   . Cancer Paternal Grandfather        bladder  . Mental retardation Other   . Breast cancer Neg Hx     Social History   Socioeconomic History  . Marital status: Married    Spouse name: Not on file  . Number of children: Not on file  . Years of  education: Not on file  . Highest education level: Not on file  Occupational History  . Not on file  Tobacco Use  . Smoking status: Never Smoker  . Smokeless tobacco: Never Used  . Tobacco comment: wine q 2-3 weeks  Vaping Use  . Vaping Use: Never used  Substance and Sexual Activity  . Alcohol use: Yes    Alcohol/week: 0.0 standard drinks    Comment: 4-5 per year   . Drug use: No  . Sexual activity: Not on file    Comment: hysterectomy  Other Topics Concern  . 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 Difficulty of Paying Living Expenses: Not on file  Food Insecurity:   . Worried About Charity fundraiser in the Last Year: Not on file  . Ran Out of Food in the Last Year: Not on file  Transportation Needs:   . Lack of Transportation (Medical): Not on file  . Lack of Transportation (Non-Medical): Not on file  Physical Activity:   . Days of Exercise per Week: Not on file  . Minutes of Exercise per Session: Not on file  Stress:   . Feeling of Stress : Not on file  Social Connections:   . Frequency of Communication with Friends and Family: Not on file  . Frequency of Social Gatherings with Friends and Family: Not on file  . Attends Religious Services: Not on file  . Active Member of Clubs or Organizations: Not on file  . Attends Archivist Meetings: Not on file  . Marital Status: Not on file    Outpatient Encounter Medications as of 11/21/2019  Medication Sig  . amLODipine (NORVASC) 5 MG tablet Take 1 tablet (5 mg total) by mouth daily.  [name] aspirin EC 81 MG EC tablet Take 1 tablet (81 mg total) by mouth daily.  . cetirizine (ZYRTEC) 10 MG tablet Take 10 mg by mouth daily.    . clobetasol (TEMOVATE) 0.05  % cream Apply 1 application topically as needed.   [name]  hydrochlorothiazide (HYDRODIURIL) 25 MG tablet Take 1 tablet (25 mg total) by mouth daily.  . metoprolol succinate (TOPROL XL) 50 MG 24 hr tablet TAKE 1 TABLET DAILY WITH OR IMMEDIATELY FOLLOWING A MEAL  . METRONIDAZOLE, TOPICAL, 0.75 % LOTN Apply 1 application topically See admin instructions. Apply to face once a day  . rosuvastatin (CRESTOR) 10 MG tablet TAKE 1 TABLET DAILY   No facility-administered encounter medications on file as of 11/21/2019.    EXAM:  BP 132/70 (BP Location: Right Arm, Patient Position: Sitting, Cuff Size: Normal)   Pulse 70   Temp 98 F (36.7 C) (Oral)   Ht 5\' 2"  (1.575 m)   Wt 152 lb (68.9 kg)   SpO2 99%   BMI 27.80 kg/m   Body mass index is 27.8 kg/m.  Physical Exam: Vital signs reviewed UXN:ATFT is a well-developed well-nourished alert cooperative   who appears stated age in no acute distress.  HEENT: normocephalic atraumatic , Eyes: PERRL EOM's full, conjunctiva clear, Nares: paten,t no deformity discharge or tenderness., Ears: no deformity EAC's clear TMs with normal landmarks. Mouthmasked  NECK: supple without masses, thyromegaly or bruits. CHEST/PULM:  Clear to auscultation and percussion breath sounds equal no wheeze , rales or rhonchi. No chest wall deformities or tenderness. Mild kyhphosis CV: PMI is nondisplaced, S1 S2 no gallops, murmurs, rubs. Peripheral pulses are full without delay.No JVD .  ABDOMEN: Bowel sounds normal nontender  No guard or rebound, no hepato splenomegal no CVA tenderness.   Extremtities:  No clubbing cyanosis or edema, no acute joint swelling or redness  NEURO:  Oriented x3, cranial nerves 3-12 appear to be intact, no obvious focal weakness,gait within normal limits no abnormal reflexes or asymmetrical SKIN: No acute rashes normal turgor, color, r forearm loacl ecchymosis senile like  PSYCH: Oriented, good eye contact, no obvious depression anxiety, cognition and  judgment appear normal. LN: no cervical axillary inguinal adenopathy No noted deficits in memory, attention, and speech.  Urinalysis    Component Value Date/Time   COLORURINE YELLOW 06/16/2019 1022   APPEARANCEUR CLEAR 06/16/2019 1022   LABSPEC 1.025 06/16/2019 1022   PHURINE 6.0 06/16/2019 1022   GLUCOSEU NEGATIVE 06/16/2019 1022   HGBUR MODERATE (A) 06/16/2019 1022   HGBUR 2+ 10/29/2009 1423   BILIRUBINUR NEGATIVE 06/16/2019 1022   BILIRUBINUR n 06/28/2018 1335   KETONESUR NEGATIVE 06/16/2019 1022   PROTEINUR Positive (A) 06/28/2018 1335   PROTEINUR NEGATIVE 10/12/2017 1509   UROBILINOGEN 0.2 06/16/2019 1022   NITRITE Negative 11/21/2019 0945   NITRITE NEGATIVE 06/16/2019 1022   LEUKOCYTESUR Negative 11/21/2019 0945   LEUKOCYTESUR NEGATIVE 06/16/2019 1022     Lab Results  Component Value Date   WBC 10.5 09/25/2018   HGB 14.3 09/25/2018   HCT 41.4 09/25/2018   PLT 257.0 09/25/2018   GLUCOSE 124 (H) 10/18/2018   CHOL 110 09/25/2018   TRIG 111.0 09/25/2018   HDL 33.60 (L) 09/25/2018   LDLDIRECT 201.9 10/26/2008   LDLCALC 54 09/25/2018   ALT 20 09/25/2018   AST 20 09/25/2018   NA 140 10/18/2018   K 3.5 10/18/2018   CL 101 10/18/2018   CREATININE 0.58 10/18/2018   BUN 15 10/18/2018   CO2 31 10/18/2018   TSH 1.84 09/25/2018   INR 1.02 10/12/2017   HGBA1C 6.2 09/25/2018    ASSESSMENT AND PLAN:  Discussed the following assessment and plan:  Essential hypertension - Plan: Hepatic function panel, Lipid panel, BASIC METABOLIC PANEL WITH GFR, CBC with Differential/Platelet, TSH, Hemoglobin A1c,  Hemoglobin A1c, TSH, CBC with Differential/Platelet, BASIC METABOLIC PANEL WITH GFR, Lipid panel, Hepatic function panel  History of recurrent UTIs - Plan: POCT Urinalysis Dip Manual, Hepatic function panel, Lipid panel, BASIC METABOLIC PANEL WITH GFR, CBC with Differential/Platelet, TSH, Hemoglobin A1c, Hemoglobin A1c, TSH, CBC with Differential/Platelet, BASIC METABOLIC PANEL  WITH GFR, Lipid panel, Hepatic function panel  Hyperlipidemia, unspecified hyperlipidemia type - Plan: Hepatic function panel, Lipid panel, BASIC METABOLIC PANEL WITH GFR, CBC with Differential/Platelet, TSH, Hemoglobin A1c, Hemoglobin A1c, TSH, CBC with Differential/Platelet, BASIC METABOLIC PANEL WITH GFR, Lipid panel, Hepatic function panel  Fasting hyperglycemia - Plan: Hepatic function panel, Lipid panel, BASIC METABOLIC PANEL WITH GFR, CBC with Differential/Platelet, TSH, Hemoglobin A1c, Hemoglobin A1c, TSH, CBC with Differential/Platelet, BASIC METABOLIC PANEL WITH GFR, Lipid panel, Hepatic function panel  Medication management - Plan: Hepatic function panel, Lipid panel, BASIC METABOLIC PANEL WITH GFR, CBC with Differential/Platelet, TSH, Hemoglobin A1c, Hemoglobin A1c, TSH, CBC with Differential/Platelet, BASIC METABOLIC PANEL WITH GFR, Lipid panel, Hepatic function panel  Asymptomatic microscopic hematuria - Plan: Hepatic function panel, Lipid panel, BASIC METABOLIC PANEL WITH GFR, CBC with Differential/Platelet, TSH, Hemoglobin A1c, Hemoglobin A1c, TSH, CBC with Differential/Platelet, BASIC METABOLIC PANEL WITH GFR, Lipid panel, Hepatic function panel  Leg cramps  TIA (transient ischemic attack) Try dec asa  To 3 d per week  Or stop if bruising   Has stroke like sx with migraine  And mri ok not sure if tia vs other   Uncertain of risk benefit but is bleed then stop  Get urology update  Apt with dr Jeffie Pollock   Follow for hematuria and  baldder polyps  Get your colon  Update  Patient Care Team: Jakaylah Schlafer, Standley Brooking, MD as PCP - General Erline Levine, MD as Attending Physician (Neurosurgery) Paula Compton, MD as Attending Physician (Obstetrics and Gynecology) Gatha Mayer, MD as Attending Physician (Gastroenterology) Jarome Matin, MD as Consulting Physician (Dermatology) Keene Breath., MD (Ophthalmology) Pieter Partridge, DO as Consulting Physician (Neurology)    Patient Instructions    try asa  3 days per week   Not sure of benefit risk for asa for you   If bleeding  Issues can stop.   Will notify you  of labs when available.  Get urology evaluation for the blood in urine fu of bladder polyps  As k for Dr Jeffie Pollock.  Get updated colonoscopy.  ? dexa scan?   Health Maintenance, Female Adopting a healthy lifestyle and getting preventive care are important in promoting health and wellness. Ask your health care provider about:  The right schedule for you to have regular tests and exams.  Things you can do on your own to prevent diseases and keep yourself healthy. What should I know about diet, weight, and exercise? Eat a healthy diet   Eat a diet that includes plenty of vegetables, fruits, low-fat dairy products, and lean protein.  Do not eat a lot of foods that are high in solid fats, added sugars, or sodium. Maintain a healthy weight Body mass index (BMI) is used to identify weight problems. It estimates body fat based on height and weight. Your health care provider can help determine your BMI and help you achieve or maintain a healthy weight. Get regular exercise Get regular exercise. This is one of the most important things you can do for your health. Most adults should:  Exercise for at least 150 minutes each week. The exercise should increase your heart rate and make you sweat (moderate-intensity exercise).  Do strengthening exercises at least twice a week. This is in addition to the moderate-intensity exercise.  Spend less time sitting. Even light physical activity can be beneficial. Watch cholesterol and blood lipids Have your blood tested for lipids and cholesterol at 79 years of age, then have this test every 5 years. Have your cholesterol levels checked more often if:  Your lipid or cholesterol levels are high.  You are older than 79 years of age.  You are at high risk for heart disease. What should I know about cancer screening? Depending on your  health history and family history, you may need to have cancer screening at various ages. This may include screening for:  Breast cancer.  Cervical cancer.  Colorectal cancer.  Skin cancer.  Lung cancer. What should I know about heart disease, diabetes, and high blood pressure? Blood pressure and heart disease  High blood pressure causes heart disease and increases the risk of stroke. This is more likely to develop in people who have high blood pressure readings, are of African descent, or are overweight.  Have your blood pressure checked: ? Every 3-5 years if you are 17-57 years of age. ? Every year if you are 61 years old or older. Diabetes Have regular diabetes screenings. This checks your fasting blood sugar level. Have the screening done:  Once every three years after age 21 if you are at a normal weight and have a low risk for diabetes.  More often and at a younger age if you are overweight or have a high risk for diabetes. What should I know about preventing infection? Hepatitis B If you have a higher risk for hepatitis B, you should be screened for this virus. Talk with your health care provider to find out if you are at risk for hepatitis B infection. Hepatitis C Testing is recommended for:  Everyone born from [birth year] through 1965.  Anyone with known risk factors for hepatitis C. Sexually transmitted infections (STIs)  Get screened for STIs, including gonorrhea and chlamydia, if: ? You are sexually active and are younger than 79 years of age. ? You are older than 79 years of age and your health care provider tells you that you are at risk for this type of infection. ? Your sexual activity has changed since you were last screened, and you are at increased risk for chlamydia or gonorrhea. Ask your health care provider if you are at risk.  Ask your health care provider about whether you are at high risk for HIV. Your health care provider may recommend a prescription  medicine to help prevent HIV infection. If you choose to take medicine to prevent HIV, you should first get tested for HIV. You should then be tested every 3 months for as long as you are taking the medicine. Pregnancy  If you are about to stop having your period (premenopausal) and you may become pregnant, seek counseling before you get pregnant.  Take 400 to 800 micrograms (mcg) of folic acid every day if you become pregnant.  Ask for birth control (contraception) if you want to prevent pregnancy. Osteoporosis and menopause Osteoporosis is a disease in which the bones lose minerals and strength with aging. This can result in bone fractures. If you are 29 years old or older, or if you are at risk for osteoporosis and fractures, ask your health care provider if you should:  Be screened for bone loss.  Take a calcium or vitamin D supplement to lower your risk of  fractures.  Be given hormone replacement therapy (HRT) to treat symptoms of menopause. Follow these instructions at home: Lifestyle  Do not use any products that contain nicotine or tobacco, such as cigarettes, e-cigarettes, and chewing tobacco. If you need help quitting, ask your health care provider.  Do not use street drugs.  Do not share needles.  Ask your health care provider for help if you need support or information about quitting drugs. Alcohol use  Do not drink alcohol if: ? Your health care provider tells you not to drink. ? You are pregnant, may be pregnant, or are planning to become pregnant.  If you drink alcohol: ? Limit how much you use to 0-1 drink a day. ? Limit intake if you are breastfeeding.  Be aware of how much alcohol is in your drink. In the [place] one drink equals one 12 oz bottle of beer (355 mL), one 5 oz glass of wine (148 mL), or one 1 oz glass of hard liquor (44 mL). General instructions  Schedule regular health, dental, and eye exams.  Stay current with your vaccines.  Tell your health  care provider if: ? You often feel depressed. ? You have ever been abused or do not feel safe at home. Summary  Adopting a healthy lifestyle and getting preventive care are important in promoting health and wellness.  Follow your health care provider's instructions about healthy diet, exercising, and getting tested or screened for diseases.  Follow your health care provider's instructions on monitoring your cholesterol and blood pressure. This information is not intended to replace advice given to you by your health care provider. Make sure you discuss any questions you have with your health care provider. Document Revised: 01/02/2018 Document Reviewed: 01/02/2018 Elsevier Patient Education  2020 [place] Loney Domingo M.D.

## 2019-11-21 ENCOUNTER — Ambulatory Visit (INDEPENDENT_AMBULATORY_CARE_PROVIDER_SITE_OTHER): Payer: Medicare Other | Admitting: Internal Medicine

## 2019-11-21 ENCOUNTER — Other Ambulatory Visit: Payer: Self-pay

## 2019-11-21 ENCOUNTER — Encounter: Payer: Self-pay | Admitting: Internal Medicine

## 2019-11-21 VITALS — BP 132/70 | HR 70 | Temp 98.0°F | Ht 62.0 in | Wt 152.0 lb

## 2019-11-21 DIAGNOSIS — Z79899 Other long term (current) drug therapy: Secondary | ICD-10-CM

## 2019-11-21 DIAGNOSIS — R7301 Impaired fasting glucose: Secondary | ICD-10-CM | POA: Diagnosis not present

## 2019-11-21 DIAGNOSIS — R3121 Asymptomatic microscopic hematuria: Secondary | ICD-10-CM

## 2019-11-21 DIAGNOSIS — G459 Transient cerebral ischemic attack, unspecified: Secondary | ICD-10-CM | POA: Diagnosis not present

## 2019-11-21 DIAGNOSIS — R252 Cramp and spasm: Secondary | ICD-10-CM | POA: Diagnosis not present

## 2019-11-21 DIAGNOSIS — I10 Essential (primary) hypertension: Secondary | ICD-10-CM | POA: Diagnosis not present

## 2019-11-21 DIAGNOSIS — Z87440 Personal history of urinary (tract) infections: Secondary | ICD-10-CM

## 2019-11-21 DIAGNOSIS — E785 Hyperlipidemia, unspecified: Secondary | ICD-10-CM

## 2019-11-21 LAB — POCT URINALYSIS DIPSTICK (MANUAL)
Leukocytes, UA: NEGATIVE
Nitrite, UA: NEGATIVE
Poct Blood: 250 — AB
Poct Glucose: NORMAL mg/dL
Poct Ketones: NEGATIVE
Poct Protein: 30 mg/dL — AB
Poct Urobilinogen: NORMAL mg/dL
Spec Grav, UA: >=1.03 — AB (ref 1.010–1.025)
pH, UA: 6 (ref 5.0–8.0)

## 2019-11-21 NOTE — Patient Instructions (Addendum)
try asa  3 days per week   Not sure of benefit risk for asa for you   If bleeding  Issues can stop.   Will notify you  of labs when available.  Get urology evaluation for the blood in urine fu of bladder polyps  As k for Dr Jeffie Pollock.  Get updated colonoscopy.  ? dexa scan?   Health Maintenance, Female Adopting a healthy lifestyle and getting preventive care are important in promoting health and wellness. Ask your health care provider about:  The right schedule for you to have regular tests and exams.  Things you can do on your own to prevent diseases and keep yourself healthy. What should I know about diet, weight, and exercise? Eat a healthy diet   Eat a diet that includes plenty of vegetables, fruits, low-fat dairy products, and lean protein.  Do not eat a lot of foods that are high in solid fats, added sugars, or sodium. Maintain a healthy weight Body mass index (BMI) is used to identify weight problems. It estimates body fat based on height and weight. Your health care provider can help determine your BMI and help you achieve or maintain a healthy weight. Get regular exercise Get regular exercise. This is one of the most important things you can do for your health. Most adults should:  Exercise for at least 150 minutes each week. The exercise should increase your heart rate and make you sweat (moderate-intensity exercise).  Do strengthening exercises at least twice a week. This is in addition to the moderate-intensity exercise.  Spend less time sitting. Even light physical activity can be beneficial. Watch cholesterol and blood lipids Have your blood tested for lipids and cholesterol at 79 years of age, then have this test every 5 years. Have your cholesterol levels checked more often if:  Your lipid or cholesterol levels are high.  You are older than 79 years of age.  You are at high risk for heart disease. What should I know about cancer screening? Depending on your  health history and family history, you may need to have cancer screening at various ages. This may include screening for:  Breast cancer.  Cervical cancer.  Colorectal cancer.  Skin cancer.  Lung cancer. What should I know about heart disease, diabetes, and high blood pressure? Blood pressure and heart disease  High blood pressure causes heart disease and increases the risk of stroke. This is more likely to develop in people who have high blood pressure readings, are of African descent, or are overweight.  Have your blood pressure checked: ? Every 3-5 years if you are 74-68 years of age. ? Every year if you are 25 years old or older. Diabetes Have regular diabetes screenings. This checks your fasting blood sugar level. Have the screening done:  Once every three years after age 71 if you are at a normal weight and have a low risk for diabetes.  More often and at a younger age if you are overweight or have a high risk for diabetes. What should I know about preventing infection? Hepatitis B If you have a higher risk for hepatitis B, you should be screened for this virus. Talk with your health care provider to find out if you are at risk for hepatitis B infection. Hepatitis C Testing is recommended for:  Everyone born from [birth year] through 1965.  Anyone with known risk factors for hepatitis C. Sexually transmitted infections (STIs)  Get screened for STIs, including gonorrhea and chlamydia, if: ?  You are sexually active and are younger than 79 years of age. ? You are older than 79 years of age and your health care provider tells you that you are at risk for this type of infection. ? Your sexual activity has changed since you were last screened, and you are at increased risk for chlamydia or gonorrhea. Ask your health care provider if you are at risk.  Ask your health care provider about whether you are at high risk for HIV. Your health care provider may recommend a prescription  medicine to help prevent HIV infection. If you choose to take medicine to prevent HIV, you should first get tested for HIV. You should then be tested every 3 months for as long as you are taking the medicine. Pregnancy  If you are about to stop having your period (premenopausal) and you may become pregnant, seek counseling before you get pregnant.  Take 400 to 800 micrograms (mcg) of folic acid every day if you become pregnant.  Ask for birth control (contraception) if you want to prevent pregnancy. Osteoporosis and menopause Osteoporosis is a disease in which the bones lose minerals and strength with aging. This can result in bone fractures. If you are 33 years old or older, or if you are at risk for osteoporosis and fractures, ask your health care provider if you should:  Be screened for bone loss.  Take a calcium or vitamin D supplement to lower your risk of fractures.  Be given hormone replacement therapy (HRT) to treat symptoms of menopause. Follow these instructions at home: Lifestyle  Do not use any products that contain nicotine or tobacco, such as cigarettes, e-cigarettes, and chewing tobacco. If you need help quitting, ask your health care provider.  Do not use street drugs.  Do not share needles.  Ask your health care provider for help if you need support or information about quitting drugs. Alcohol use  Do not drink alcohol if: ? Your health care provider tells you not to drink. ? You are pregnant, may be pregnant, or are planning to become pregnant.  If you drink alcohol: ? Limit how much you use to 0-1 drink a day. ? Limit intake if you are breastfeeding.  Be aware of how much alcohol is in your drink. In the [place] one drink equals one 12 oz bottle of beer (355 mL), one 5 oz glass of wine (148 mL), or one 1 oz glass of hard liquor (44 mL). General instructions  Schedule regular health, dental, and eye exams.  Stay current with your vaccines.  Tell your health  care provider if: ? You often feel depressed. ? You have ever been abused or do not feel safe at home. Summary  Adopting a healthy lifestyle and getting preventive care are important in promoting health and wellness.  Follow your health care provider's instructions about healthy diet, exercising, and getting tested or screened for diseases.  Follow your health care provider's instructions on monitoring your cholesterol and blood pressure. This information is not intended to replace advice given to you by your health care provider. Make sure you discuss any questions you have with your health care provider. Document Revised: 01/02/2018 Document Reviewed: 01/02/2018 Elsevier Patient Education  [employer]. .

## 2019-11-22 LAB — HEMOGLOBIN A1C
Hgb A1c MFr Bld: 6 % of total Hgb — ABNORMAL HIGH (ref <5.7)
Mean Plasma Glucose: 126 (calc)
eAG (mmol/L): 7 (calc)

## 2019-11-22 LAB — LIPID PANEL
Cholesterol: 134 mg/dL (ref <200)
HDL: 35 mg/dL — ABNORMAL LOW (ref >=50)
LDL Cholesterol (Calc): 73 mg/dL (calc)
Non-HDL Cholesterol (Calc): 99 mg/dL (calc) (ref <130)
Total CHOL/HDL Ratio: 3.8 (calc) (ref <5.0)
Triglycerides: 185 mg/dL — ABNORMAL HIGH (ref <150)

## 2019-11-22 LAB — CBC WITH DIFFERENTIAL/PLATELET
Absolute Monocytes: 912 cells/uL (ref 200–950)
Basophils Absolute: 159 cells/uL (ref 0–200)
Basophils Relative: 1.5 %
Eosinophils Absolute: 212 cells/uL (ref 15–500)
Eosinophils Relative: 2 %
HCT: 44.8 % (ref 35.0–45.0)
Hemoglobin: 15.1 g/dL (ref 11.7–15.5)
Lymphs Abs: 2321 cells/uL (ref 850–3900)
MCH: 30 pg (ref 27.0–33.0)
MCHC: 33.7 g/dL (ref 32.0–36.0)
MCV: 88.9 fL (ref 80.0–100.0)
MPV: 9.1 fL (ref 7.5–12.5)
Monocytes Relative: 8.6 %
Neutro Abs: 6996 cells/uL (ref 1500–7800)
Neutrophils Relative %: 66 %
Platelets: 274 10*3/uL (ref 140–400)
RBC: 5.04 10*6/uL (ref 3.80–5.10)
RDW: 11.7 % (ref 11.0–15.0)
Total Lymphocyte: 21.9 %
WBC: 10.6 10*3/uL (ref 3.8–10.8)

## 2019-11-22 LAB — BASIC METABOLIC PANEL WITH GFR
BUN: 16 mg/dL (ref 7–25)
CO2: 27 mmol/L (ref 20–32)
Calcium: 10.5 mg/dL — ABNORMAL HIGH (ref 8.6–10.4)
Chloride: 100 mmol/L (ref 98–110)
Creat: 0.64 mg/dL (ref 0.60–0.93)
GFR, Est African American: 98 mL/min/{1.73_m2} (ref >=60)
GFR, Est Non African American: 85 mL/min/{1.73_m2} (ref >=60)
Glucose, Bld: 117 mg/dL — ABNORMAL HIGH (ref 65–99)
Potassium: 4 mmol/L (ref 3.5–5.3)
Sodium: 138 mmol/L (ref 135–146)

## 2019-11-22 LAB — TSH: TSH: 1.44 mIU/L (ref 0.40–4.50)

## 2019-11-22 LAB — HEPATIC FUNCTION PANEL
AG Ratio: 2 (calc) (ref 1.0–2.5)
ALT: 16 U/L (ref 6–29)
AST: 17 U/L (ref 10–35)
Albumin: 4.5 g/dL (ref 3.6–5.1)
Alkaline phosphatase (APISO): 48 U/L (ref 37–153)
Bilirubin, Direct: 0.1 mg/dL (ref 0.0–0.2)
Globulin: 2.2 g/dL (calc) (ref 1.9–3.7)
Indirect Bilirubin: 0.4 mg/dL (calc) (ref 0.2–1.2)
Total Bilirubin: 0.5 mg/dL (ref 0.2–1.2)
Total Protein: 6.7 g/dL (ref 6.1–8.1)

## 2019-11-25 NOTE — Progress Notes (Signed)
Results stable  a1c is pre diabetic but better than last  year  Continue lifestyle intervention healthy eating and exercise .

## 2019-11-27 ENCOUNTER — Ambulatory Visit
Admission: RE | Admit: 2019-11-27 | Discharge: 2019-11-27 | Disposition: A | Discharge status: Home / Self Care | Payer: Medicare Other | Source: Ambulatory Visit | Attending: Obstetrics and Gynecology | Admitting: Obstetrics and Gynecology

## 2019-11-27 ENCOUNTER — Other Ambulatory Visit: Payer: Self-pay

## 2019-11-27 DIAGNOSIS — Z1231 Encounter for screening mammogram for malignant neoplasm of breast: Secondary | ICD-10-CM

## 2019-12-02 ENCOUNTER — Other Ambulatory Visit: Payer: Self-pay | Admitting: Obstetrics and Gynecology

## 2019-12-02 DIAGNOSIS — R928 Other abnormal and inconclusive findings on diagnostic imaging of breast: Secondary | ICD-10-CM

## 2019-12-12 ENCOUNTER — Other Ambulatory Visit: Payer: Self-pay | Admitting: Obstetrics and Gynecology

## 2019-12-12 ENCOUNTER — Ambulatory Visit
Admission: RE | Admit: 2019-12-12 | Discharge: 2019-12-12 | Disposition: A | Discharge status: Home / Self Care | Payer: Medicare Other | Source: Ambulatory Visit | Attending: Obstetrics and Gynecology | Admitting: Obstetrics and Gynecology

## 2019-12-12 ENCOUNTER — Other Ambulatory Visit: Payer: Self-pay

## 2019-12-12 DIAGNOSIS — R921 Mammographic calcification found on diagnostic imaging of breast: Secondary | ICD-10-CM

## 2019-12-12 DIAGNOSIS — R928 Other abnormal and inconclusive findings on diagnostic imaging of breast: Secondary | ICD-10-CM | POA: Diagnosis not present

## 2019-12-16 ENCOUNTER — Ambulatory Visit: Payer: Medicare Other | Admitting: Internal Medicine

## 2019-12-25 ENCOUNTER — Other Ambulatory Visit: Payer: Self-pay

## 2019-12-25 ENCOUNTER — Ambulatory Visit
Admission: RE | Admit: 2019-12-25 | Discharge: 2019-12-25 | Disposition: A | Discharge status: Home / Self Care | Payer: Medicare Other | Source: Ambulatory Visit | Attending: Obstetrics and Gynecology | Admitting: Obstetrics and Gynecology

## 2019-12-25 DIAGNOSIS — R921 Mammographic calcification found on diagnostic imaging of breast: Secondary | ICD-10-CM | POA: Diagnosis not present

## 2019-12-25 DIAGNOSIS — N6489 Other specified disorders of breast: Secondary | ICD-10-CM | POA: Diagnosis not present

## 2020-02-02 MED ORDER — METOPROLOL SUCCINATE ER 50 MG PO TB24: ORAL_TABLET | ORAL | 1 refills | Status: DC

## 2020-02-02 MED ORDER — HYDROCHLOROTHIAZIDE 25 MG PO TABS
25.0000 mg | ORAL_TABLET | Freq: Every day | ORAL | 1 refills | Status: DC
Start: 2020-02-02 — End: 2020-09-07

## 2020-02-02 MED ORDER — ROSUVASTATIN CALCIUM 10 MG PO TABS
ORAL_TABLET | ORAL | 1 refills | Status: DC
Start: 2020-02-02 — End: 2020-09-07

## 2020-02-02 MED ORDER — AMLODIPINE BESYLATE 5 MG PO TABS
5.0000 mg | ORAL_TABLET | Freq: Every day | ORAL | 1 refills | Status: DC
Start: 2020-02-02 — End: 2020-09-07

## 2020-02-02 NOTE — Telephone Encounter (Signed)
pt requesting a refill on medication  hydrochlorothiazide (HYDRODIURIL) 25 MG tablet rosuvastatin (CRESTOR) 10 MG tablet amLODipine (NORVASC) 5 MG tablet metoprolol succinate (TOPROL XL) 50 MG 24 hr tablet [place], [place] - [address]  Phone:  [phone] Fax:  [phone]

## 2020-04-20 DIAGNOSIS — L900 Lichen sclerosus et atrophicus: Secondary | ICD-10-CM | POA: Diagnosis not present

## 2020-04-20 DIAGNOSIS — Z01419 Encounter for gynecological examination (general) (routine) without abnormal findings: Secondary | ICD-10-CM | POA: Diagnosis not present

## 2020-04-20 LAB — HM PAP SMEAR: HM Pap smear: NEGATIVE

## 2020-04-21 DIAGNOSIS — Z124 Encounter for screening for malignant neoplasm of cervix: Secondary | ICD-10-CM | POA: Diagnosis not present

## 2020-04-27 DIAGNOSIS — R3121 Asymptomatic microscopic hematuria: Secondary | ICD-10-CM | POA: Diagnosis not present

## 2020-05-17 DIAGNOSIS — K4020 Bilateral inguinal hernia, without obstruction or gangrene, not specified as recurrent: Secondary | ICD-10-CM | POA: Diagnosis not present

## 2020-05-17 DIAGNOSIS — R3121 Asymptomatic microscopic hematuria: Secondary | ICD-10-CM | POA: Diagnosis not present

## 2020-05-17 DIAGNOSIS — D1771 Benign lipomatous neoplasm of kidney: Secondary | ICD-10-CM | POA: Diagnosis not present

## 2020-05-17 DIAGNOSIS — K449 Diaphragmatic hernia without obstruction or gangrene: Secondary | ICD-10-CM | POA: Diagnosis not present

## 2020-05-17 DIAGNOSIS — N281 Cyst of kidney, acquired: Secondary | ICD-10-CM | POA: Diagnosis not present

## 2020-05-19 DIAGNOSIS — N281 Cyst of kidney, acquired: Secondary | ICD-10-CM | POA: Diagnosis not present

## 2020-05-19 DIAGNOSIS — D3002 Benign neoplasm of left kidney: Secondary | ICD-10-CM | POA: Diagnosis not present

## 2020-05-19 DIAGNOSIS — R3121 Asymptomatic microscopic hematuria: Secondary | ICD-10-CM | POA: Diagnosis not present

## 2020-05-20 ENCOUNTER — Other Ambulatory Visit: Payer: Self-pay | Admitting: Urology

## 2020-06-08 ENCOUNTER — Other Ambulatory Visit: Payer: Self-pay

## 2020-06-08 ENCOUNTER — Encounter (HOSPITAL_BASED_OUTPATIENT_CLINIC_OR_DEPARTMENT_OTHER): Payer: Self-pay | Admitting: Urology

## 2020-06-08 NOTE — H&P (Signed)
I have blood in my urine.     4/27/22Vaughan Erickson returns today for cystoscopy for evaluation of microhematuria. 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 Her UA has 3-10 RBC's today.   GU Hx: Madison Erickson is a former patient of the practice with a history of microhematuria with bladder polyps and a left renal AML. She has Lichen Sclerosis of the genitals and uses chlobetasol for that . She has had UTI's in the past but none recently. The last was in 2/21. She had some urgency yesterday and took Azo with relief. Her UA today is unremarkable. She did have clinically significant hematuria last year on several UA's. An Abdominal MRI in 2009 showed a small left renal cyst but no AMLs. 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   CLINICAL DATA: Microhematuria   EXAM:  CT ABDOMEN AND PELVIS WITHOUT AND WITH CONTRAST   TECHNIQUE:  Multidetector CT imaging of the abdomen and pelvis was performed  following the standard protocol before and following the bolus  administration of intravenous contrast.   CONTRAST: 125 mL Omnipaque 300 IV   COMPARISON: None.   FINDINGS:  Lower chest: Lung bases are clear.   Hepatobiliary: Liver is within normal limits.   Gallbladder is unremarkable. No intrahepatic or extrahepatic ductal  dilatation.   Pancreas: Within normal limits.   Spleen: Within normal limits.   Adrenals/Urinary Tract: Adrenal glands are within normal limits.   Small left renal cysts, measuring up to 15 mm in the left lower pole  (series 8/image 37), benign (Bosniak I). Additional 5 mm fat density  lesion in the lateral left upper kidney (series 5/image 26) may  reflect a tiny angiomyolipoma, benign. Right kidney is within normal  limits. No enhancing renal lesions.   No renal, ureteral, or bladder calculi. No hydronephrosis.   On delayed imaging, there are no filling defects in the bilateral  opacified  proximal collecting systems, ureters, or bladder.   Bladder is within normal limits.   Stomach/Bowel: Stomach is notable for a tiny hiatal hernia.   No evidence of bowel obstruction.   Appendix is not discretely visualized, likely surgically absent.   Sigmoid diverticulosis, without evidence of diverticulitis.   Vascular/Lymphatic: No evidence of abdominal aortic aneurysm.   Atherosclerotic calcifications of the abdominal aorta and branch  vessels.   No suspicious abdominopelvic lymphadenopathy.   Reproductive: Status post hysterectomy.   No adnexal masses.   Other: No abdominopelvic ascites.   Tiny fat containing bilateral inguinal hernias (series 5/images 70  and 72).   Musculoskeletal: Visualized osseous structures are within normal  limits.   IMPRESSION:  5 mm angiomyolipoma in the left upper kidney, benign. Additional  small left renal cysts measuring up to 15 mm in the left lower pole,  benign (Bosniak I). No enhancing renal lesions.    Electronically Signed  By: Julian Hy M.D.  On: 05/18/2020 08:13      ALLERGIES: Sulfa Drugs    MEDICATIONS: Aspirin  Metoprolol Succinate 50 mg tablet, extended release 24 hr  Amlodipine Besylate 5 mg tablet  Clobetasol Emollient 0.05 % cream  HydroCHLOROthiazide TABS Oral  Rosuvastatin Calcium 10 mg tablet     GU PSH: Hysterectomy - 1974 Locm 300-399Mg /Ml Iodine,1Ml - 05/17/2020       PSH Notes: Appendectomy, Lung Lobectomy, Cesarean Section   NON-GU PSH: Appendectomy - 2009 Back surgery Cesarean Delivery Only -  2009 Knee Arthroscopy, x's 2 Lung lobectomy - 1969 Partial Remove Lung - 2009     GU PMH: Microscopic hematuria - 05/17/2020, SHe has had persistent microhematuria and is overdue for evaluation. I will get her set up for a CT hematuria study and cystoscopy. , - 04/27/2020 Personal Hx Urinary Tract Infections, She last had a UTI about a year ago but had some mild dysuria yesterday. She has Heme  and LE on the dip UA but the unspun micro was bland. I will get a culture because of the symptoms and history. - 04/27/2020 Hematuria, Unspec, Hematuria - 2014 Urinary Tract Inf, Unspec site, Urinary tract infection - 2014      PMH Notes:  1898-01-23 00:00:00 - Note: Normal Routine History And Physical Senior Citizen [phone]  2007-11-22 15:32:50 - Note: Arthritis  2012-11-16 05:28:47 - Note: Renal angiomyolipoma, unspecified laterality   NON-GU PMH: Personal history of other endocrine, nutritional and metabolic disease, History of hypercholesterolemia - 2014 Personal history of other specified conditions, History of heartburn - 2014 Arthritis GERD Hypertension    FAMILY HISTORY: 2 daughters - Daughter 1 son - Son Bladder Cancer - Runs In Family Diabetes - Mother Father Deceased At Age60 ___ - Grandfather Heart Disease - Mother Lung Cancer - Grandmother Mother Deceased At Age 10 from diabetic complicati - Grandfather   SOCIAL HISTORY: Marital Status: Married Preferred Language: English; Ethnicity: Not Hispanic Or Latino; Race: White Current Smoking Status: Patient has never smoked.   Tobacco Use Assessment Completed: Used Tobacco in last 30 days? Drinks 2 drinks per month. Types of alcohol consumed: Wine.  Drinks 4+ caffeinated drinks per day. Patient's occupation is/was retired.    REVIEW OF SYSTEMS:    GU Review Female:   Patient denies frequent urination, hard to postpone urination, burning /pain with urination, get up at night to urinate, leakage of urine, stream starts and stops, trouble starting your stream, have to strain to urinate, and being pregnant.  Gastrointestinal (Upper):   Patient denies nausea, vomiting, and indigestion/ heartburn.  Gastrointestinal (Lower):   Patient denies diarrhea and constipation.  Constitutional:   Patient denies fever, night sweats, weight loss, and fatigue.  Skin:   Patient denies skin rash/ lesion and itching.  Eyes:   Patient denies  blurred vision and double vision.  Ears/ Nose/ Throat:   Patient denies sore throat and sinus problems.  Hematologic/Lymphatic:   Patient denies swollen glands and easy bruising.  Cardiovascular:   Patient denies leg swelling and chest pains.  Respiratory:   Patient denies cough and shortness of breath.  Endocrine:   Patient denies excessive thirst.  Musculoskeletal:   Patient denies back pain and joint pain.  Neurological:   Patient denies headaches and dizziness.  Psychologic:   Patient denies depression and anxiety.   VITAL SIGNS: None   Complexity of Data:  Records Review:   Previous Patient Records  Urine Test Review:   Urinalysis  X-Ray Review: C.T. Hematuria: Reviewed Films. Reviewed Report. Discussed With Patient.     PROCEDURES:         Flexible Cystoscopy - 52000  Risks, benefits, and some of the potential complications of the procedure were discussed. She was prepped with betadine. She has severe introital stenosis.  Meatus:  Normal size. Normal location. Normal condition.  Urethra:  No hypermobility. No leakage.  Ureteral Orifices:  Normal location. Normal size. Normal shape. Effluxed clear urine.  Bladder:  No trabeculation. No tumors.. No stones. There is increased vascular density on the  posterior wall and dome with a couple of small lesions on the posterior wall that could be inflammatory or CIS.       The procedure was well tolerated and there were no complications.         Urinalysis w/Scope - 81001 Dipstick Dipstick Cont'd Micro  Color: Yellow Bilirubin: Neg mg/dL WBC/hpf: NS (Not Seen)  Color: Yellow Bilirubin: Neg mg/dL WBC/hpf: NS (Not Seen)  Appearance: Clear Ketones: Neg mg/dL RBC/hpf: 3 - 10/hpf  Appearance: Clear Ketones: Neg mg/dL RBC/hpf: 3 - 10/hpf  Specific Gravity: 1.020 Blood: 3+ ery/uL Bacteria: Rare (0-9/hpf)  Specific Gravity: 1.020 Blood: 3+ ery/uL Bacteria: Rare (0-9/hpf)  pH: 6.0 Protein: 1+ mg/dL Cystals: NS (Not Seen)  pH: 6.0 Protein: 1+  mg/dL Cystals: NS (Not Seen)  Glucose: Neg mg/dL Urobilinogen: 1.0 mg/dL Casts: NS (Not Seen)  Glucose: Neg mg/dL Urobilinogen: 1.0 mg/dL Casts: NS (Not Seen)    Nitrites: Neg Trichomonas: Not Present    Nitrites: Neg Trichomonas: Not Present    Leukocyte Esterase: Neg leu/uL Mucous: Present    Leukocyte Esterase: Neg leu/uL Mucous: Present      Epithelial Cells: 0 - 5/hpf      Epithelial Cells: 0 - 5/hpf      Yeast: NS (Not Seen)      Yeast: NS (Not Seen)      Sperm: Not Present      Sperm: Not Present    Notes: qns to spin    ASSESSMENT:      ICD-10 Details  1 GU:   Microscopic hematuria - R31.21 Chronic, Stable - She has persistent hematuria with a subtle posterior bladder wall lesion that could be inflammatory but CIS needs to be ruled out. I will get her set up for cystoscopy with biopsy and fulguration and reviewed the risks of bleeding, infection, bladder wall injury, thrombotic events and anesthetic complications.   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   3   Renal cyst - N28.1 Minor  4   Bladder tumor/neoplasm - D41.4 Undiagnosed New Problem     PLAN:           Schedule Return Visit/Planned Activity: Next Available Appointment - Schedule Surgery  Procedure: Unspecified Date - Cysto Bladder Ureth Biopsy - 54008 Notes: Next available.           Document Letter(s):  Created for Patient: Clinical Summary

## 2020-06-08 NOTE — Progress Notes (Signed)
Spoke w/ via phone for pre-op interview---pt Lab needs dos----  I stat ekg             Lab results------see below COVID test -----patient states asymptomatic no test needed Arrive at -------06-11-2020 530 am NPO after MN NO Solid Food.  Clear liquids from MN until---430 am then npo Med rec completed Medications to take morning of surgery -----amlodipine, metorpolol succinate, rosuvastatin Diabetic medication -----n/a Patient instructed to bring photo id and insurance card day of surgery Patient aware to have Driver (ride ) / caregiver  Spouse robert will stay   for 24 hours after surgery  Patient Special Instructions -----none Pre-Op special Istructions -----none Patient verbalized understanding of instructions that were given at this phone interview. Patient denies shortness of breath, chest pain, fever, cough at this phone interview.  lov neurology dr Tomi Likens 10-31-2018 f/u prn epic Echo 10-18-2017 epic Vas US carotid 10-13-2017 epic

## 2020-06-11 ENCOUNTER — Other Ambulatory Visit: Payer: Self-pay

## 2020-06-11 ENCOUNTER — Ambulatory Visit (HOSPITAL_BASED_OUTPATIENT_CLINIC_OR_DEPARTMENT_OTHER)
Admission: RE | Admit: 2020-06-11 | Discharge: 2020-06-11 | Disposition: A | Discharge status: Home / Self Care | Payer: Medicare Other | Attending: Urology | Admitting: Urology

## 2020-06-11 ENCOUNTER — Ambulatory Visit (HOSPITAL_BASED_OUTPATIENT_CLINIC_OR_DEPARTMENT_OTHER): Payer: Medicare Other | Admitting: Anesthesiology

## 2020-06-11 ENCOUNTER — Encounter (HOSPITAL_BASED_OUTPATIENT_CLINIC_OR_DEPARTMENT_OTHER)
Admission: RE | Disposition: A | Discharge status: Home / Self Care | Payer: Self-pay | Source: Home / Self Care | Attending: Urology

## 2020-06-11 ENCOUNTER — Encounter (HOSPITAL_BASED_OUTPATIENT_CLINIC_OR_DEPARTMENT_OTHER): Payer: Self-pay | Admitting: Urology

## 2020-06-11 DIAGNOSIS — Z8052 Family history of malignant neoplasm of bladder: Secondary | ICD-10-CM | POA: Insufficient documentation

## 2020-06-11 DIAGNOSIS — N3021 Other chronic cystitis with hematuria: Secondary | ICD-10-CM | POA: Diagnosis not present

## 2020-06-11 DIAGNOSIS — N3020 Other chronic cystitis without hematuria: Secondary | ICD-10-CM | POA: Insufficient documentation

## 2020-06-11 DIAGNOSIS — Z882 Allergy status to sulfonamides status: Secondary | ICD-10-CM | POA: Diagnosis not present

## 2020-06-11 DIAGNOSIS — D414 Neoplasm of uncertain behavior of bladder: Secondary | ICD-10-CM | POA: Diagnosis not present

## 2020-06-11 DIAGNOSIS — G43909 Migraine, unspecified, not intractable, without status migrainosus: Secondary | ICD-10-CM | POA: Diagnosis not present

## 2020-06-11 DIAGNOSIS — I781 Nevus, non-neoplastic: Secondary | ICD-10-CM | POA: Diagnosis not present

## 2020-06-11 DIAGNOSIS — Z79899 Other long term (current) drug therapy: Secondary | ICD-10-CM | POA: Insufficient documentation

## 2020-06-11 DIAGNOSIS — E785 Hyperlipidemia, unspecified: Secondary | ICD-10-CM | POA: Diagnosis not present

## 2020-06-11 DIAGNOSIS — N329 Bladder disorder, unspecified: Secondary | ICD-10-CM | POA: Diagnosis present

## 2020-06-11 DIAGNOSIS — J302 Other seasonal allergic rhinitis: Secondary | ICD-10-CM | POA: Diagnosis not present

## 2020-06-11 HISTORY — DX: Bladder disorder, unspecified: N32.9

## 2020-06-11 HISTORY — PX: CYSTOSCOPY WITH BIOPSY: SHX5122

## 2020-06-11 HISTORY — DX: Hyperlipidemia, unspecified: E78.5

## 2020-06-11 HISTORY — DX: Personal history of other diseases of the nervous system and sense organs: Z86.69

## 2020-06-11 HISTORY — DX: Presence of spectacles and contact lenses: Z97.3

## 2020-06-11 LAB — POCT I-STAT, CHEM 8
BUN: 15 mg/dL (ref 8–23)
Calcium, Ion: 1.26 mmol/L (ref 1.15–1.40)
Chloride: 102 mmol/L (ref 98–111)
Creatinine, Ser: 0.5 mg/dL (ref 0.44–1.00)
Glucose, Bld: 140 mg/dL — ABNORMAL HIGH (ref 70–99)
HCT: 44 % (ref 36.0–46.0)
Hemoglobin: 15 g/dL (ref 12.0–15.0)
Potassium: 3.3 mmol/L — ABNORMAL LOW (ref 3.5–5.1)
Sodium: 141 mmol/L (ref 135–145)
TCO2: 27 mmol/L (ref 22–32)

## 2020-06-11 SURGERY — CYSTOSCOPY, WITH BIOPSY
Anesthesia: General | Site: Bladder

## 2020-06-11 MED ORDER — TRAMADOL HCL 50 MG PO TABS: 50.0000 mg | ORAL_TABLET | Freq: Four times a day (QID) | ORAL | 0 refills | Status: AC | PRN

## 2020-06-11 MED ORDER — PROPOFOL 500 MG/50ML IV EMUL
INTRAVENOUS | Status: AC
  Filled 2020-06-11: qty 50

## 2020-06-11 MED ORDER — PROPOFOL 10 MG/ML IV BOLUS
INTRAVENOUS | Status: DC | PRN
  Administered 2020-06-11: 140 mg via INTRAVENOUS
  Administered 2020-06-11: 50 mg via INTRAVENOUS

## 2020-06-11 MED ORDER — DEXAMETHASONE SODIUM PHOSPHATE 10 MG/ML IJ SOLN
INTRAMUSCULAR | Status: DC | PRN
  Administered 2020-06-11: 4 mg via INTRAVENOUS

## 2020-06-11 MED ORDER — STERILE WATER FOR IRRIGATION IR SOLN
Status: DC | PRN
  Administered 2020-06-11: 500 mL

## 2020-06-11 MED ORDER — LIDOCAINE 2% (20 MG/ML) 5 ML SYRINGE
INTRAMUSCULAR | Status: DC | PRN
  Administered 2020-06-11: 100 mg via INTRAVENOUS

## 2020-06-11 MED ORDER — LACTATED RINGERS IV SOLN
INTRAVENOUS | Status: DC
  Administered 2020-06-11: 1000 mL via INTRAVENOUS

## 2020-06-11 MED ORDER — CEFAZOLIN SODIUM-DEXTROSE 2-4 GM/100ML-% IV SOLN
INTRAVENOUS | Status: AC
  Filled 2020-06-11: qty 100

## 2020-06-11 MED ORDER — LIDOCAINE 2% (20 MG/ML) 5 ML SYRINGE
INTRAMUSCULAR | Status: AC
  Filled 2020-06-11: qty 5

## 2020-06-11 MED ORDER — ONDANSETRON HCL 4 MG/2ML IJ SOLN
INTRAMUSCULAR | Status: DC | PRN
  Administered 2020-06-11: 4 mg via INTRAVENOUS

## 2020-06-11 MED ORDER — DEXAMETHASONE SODIUM PHOSPHATE 10 MG/ML IJ SOLN
INTRAMUSCULAR | Status: AC
  Filled 2020-06-11: qty 1

## 2020-06-11 MED ORDER — EPHEDRINE SULFATE-NACL 50-0.9 MG/10ML-% IV SOSY
PREFILLED_SYRINGE | INTRAVENOUS | Status: DC | PRN
  Administered 2020-06-11: 10 mg via INTRAVENOUS

## 2020-06-11 MED ORDER — ACETAMINOPHEN 500 MG PO TABS
1000.0000 mg | ORAL_TABLET | Freq: Once | ORAL | Status: AC
  Administered 2020-06-11: 1000 mg via ORAL

## 2020-06-11 MED ORDER — SODIUM CHLORIDE 0.9% FLUSH: 3.0000 mL | Freq: Two times a day (BID) | INTRAVENOUS | Status: DC

## 2020-06-11 MED ORDER — FENTANYL CITRATE (PF) 100 MCG/2ML IJ SOLN
INTRAMUSCULAR | Status: AC
  Filled 2020-06-11: qty 2

## 2020-06-11 MED ORDER — ONDANSETRON HCL 4 MG/2ML IJ SOLN
INTRAMUSCULAR | Status: AC
  Filled 2020-06-11: qty 2

## 2020-06-11 MED ORDER — FENTANYL CITRATE (PF) 100 MCG/2ML IJ SOLN
INTRAMUSCULAR | Status: DC | PRN
  Administered 2020-06-11: 50 ug via INTRAVENOUS

## 2020-06-11 MED ORDER — CEFAZOLIN SODIUM-DEXTROSE 2-4 GM/100ML-% IV SOLN
2.0000 g | INTRAVENOUS | Status: AC
  Administered 2020-06-11: 2 g via INTRAVENOUS

## 2020-06-11 MED ORDER — ACETAMINOPHEN 500 MG PO TABS
ORAL_TABLET | ORAL | Status: AC
  Filled 2020-06-11: qty 2

## 2020-06-11 SURGICAL SUPPLY — 22 items
BAG DRAIN URO-CYSTO SKYTR STRL (DRAIN) ×3 IMPLANT
BAG DRN UROCATH (DRAIN) ×1
CATH FOLEY 2WAY SLVR  5CC 16FR (CATHETERS)
CATH FOLEY 2WAY SLVR  5CC 18FR (CATHETERS) ×3
CATH FOLEY 2WAY SLVR 5CC 16FR (CATHETERS) IMPLANT
CATH FOLEY 2WAY SLVR 5CC 18FR (CATHETERS) IMPLANT
CLOTH BEACON ORANGE TIMEOUT ST (SAFETY) ×3 IMPLANT
ELECT REM PT RETURN 9FT ADLT (ELECTROSURGICAL) ×3
ELECTRODE REM PT RTRN 9FT ADLT (ELECTROSURGICAL) ×1 IMPLANT
GLOVE SURG POLYISO LF SZ8 (GLOVE) ×3 IMPLANT
GOWN STRL REUS W/TWL LRG LVL3 (GOWN DISPOSABLE) ×6 IMPLANT
KIT TURNOVER CYSTO (KITS) ×3 IMPLANT
MANIFOLD NEPTUNE II (INSTRUMENTS) ×3 IMPLANT
NDL SAFETY ECLIPSE 18X1.5 (NEEDLE) IMPLANT
NEEDLE HYPO 18GX1.5 SHARP (NEEDLE)
NEEDLE HYPO 22GX1.5 SAFETY (NEEDLE) IMPLANT
NS IRRIG 500ML POUR BTL (IV SOLUTION) ×2 IMPLANT
PACK CYSTO (CUSTOM PROCEDURE TRAY) ×3 IMPLANT
SYR 20ML LL LF (SYRINGE) ×2 IMPLANT
TUBE CONNECTING 12'X1/4 (SUCTIONS) ×1
TUBE CONNECTING 12X1/4 (SUCTIONS) ×2 IMPLANT
WATER STERILE IRR 3000ML UROMA (IV SOLUTION) ×3 IMPLANT

## 2020-06-11 NOTE — Anesthesia Procedure Notes (Signed)
Procedure Name: LMA Insertion Date/Time: 06/11/2020 7:32 AM Performed by: Bonney Aid, CRNA Pre-anesthesia Checklist: Patient identified, Emergency Drugs available, Suction available and Patient being monitored Patient Re-evaluated:Patient Re-evaluated prior to induction Oxygen Delivery Method: Circle system utilized Preoxygenation: Pre-oxygenation with 100% oxygen Induction Type: IV induction Ventilation: Mask ventilation without difficulty LMA: LMA inserted LMA Size: 4.0 Number of attempts: 2 Airway Equipment and Method: Bite block Placement Confirmation: positive ETCO2 Tube secured with: Tape Dental Injury: Teeth and Oropharynx as per pre-operative assessment

## 2020-06-11 NOTE — Anesthesia Postprocedure Evaluation (Signed)
Anesthesia Post Note  Patient: Madison Erickson  Procedure(s) Performed: CYSTOSCOPY WITH BLADDER BIOPSY AND FULGURATION (N/A Bladder)     Patient location during evaluation: PACU Anesthesia Type: General Level of consciousness: awake and alert Pain management: pain level controlled Vital Signs Assessment: post-procedure vital signs reviewed and stable Respiratory status: spontaneous breathing, nonlabored ventilation, respiratory function stable and patient connected to nasal cannula oxygen Cardiovascular status: blood pressure returned to baseline and stable Postop Assessment: no apparent nausea or vomiting Anesthetic complications: no   No complications documented.  Last Vitals:  Vitals:   06/11/20 0842 06/11/20 0924  BP:  132/63  Pulse: 66 67  Resp: 19 18  Temp:    SpO2: 100% 98%    Last Pain:  Vitals:   06/11/20 0924  TempSrc:   PainSc: 0-No pain                 Catalina Gravel

## 2020-06-11 NOTE — Op Note (Signed)
Procedure: Cystoscopy with bladder biopsy and fulguration of 2 cm dome lesion.  Preop diagnosis bladder wall lesion on the dome.  Postop diagnosis: Same.  Surgeon: Dr. [name].  Anesthesia: General.  Specimen: Bladder biopsies from dome.  Drain: 13 [place] Foley catheter.  EBL: None.  Complications: None.  Indications: The patient is a 80 year old female who recently underwent cystoscopy for Microhematuria and was found to have an erythematous lesion on the dome of the bladder which is felt to be most likely inflammatory but carcinoma in situ was also a possibility.  It was felt that cystoscopy and biopsy were indicated.  Procedure: She was taken operating room where she was given 2 g of Ancef.  A general anesthetic was induced.  She was placed in lithotomy position and fitted with PAS hose.  Her perineum and genitalia were prepped with Betadine solution she was draped in usual sterile fashion.  Cystoscopy was performed using a 21 [place] scope and the 30 degree lens.  Placement of the scope required use the obturator due to some mild meatal stenosis.  Inspection of the bladder and straight is a otherwise normal urethra.  Ureteral orifices were in the normal anatomic position.  The bladder wall was smooth but on the dome there was an area of erythema that was approximately 2 cm wide and varying from 5 mm to 10 mm deep.  The appearance is most consistent with an inflammatory lesion but carcinoma in situ was indeed a possibility.  The remainder of the bladder wall mucosa was generally unremarkable but there was a faint yellowish cast and a patchy fashion distributed across the remainder of the bladder wall it was felt to possibly be related to the fat around the thin bladder wall.  A cup biopsy forceps was used to obtain 2 biopsies from the lesion.  Fat was identified at the biopsy site but no clear bladder wall perforation was noted.  The biopsy site and the remaining abnormal mucosa was then  fulgurated with a Bugbee electrode.  Final inspection revealed no active bleeding and once again no obvious bladder wall perforation, but was a thinness of the bladder wall in the appearance of fat felt Foley catheter drainage is indicated.  An 69 French Foley catheter was inserted after the cystoscope was removed.  The balloon was filled with 10 mL of sterile fluid and the catheter was placed to straight drainage.  She was taken down from lithotomy position, her anesthetic was reversed and she was moved to recovery in stable condition.  There were no complications.

## 2020-06-11 NOTE — Interval H&P Note (Signed)
History and Physical Interval Note:  06/11/2020 7:19 AM  Madison Erickson  has presented today for surgery, with the diagnosis of BLADDER LESION.  The various methods of treatment have been discussed with the patient and family. After consideration of risks, benefits and other options for treatment, the patient has consented to  Procedure(s): CYSTOSCOPY WITH BLADDER BIOPSY AND FULGURATION (N/A) as a surgical intervention.  The patient's history has been reviewed, patient examined, no change in status, stable for surgery.  I have reviewed the patient's chart and labs.  Questions were answered to the patient's satisfaction.     [name]

## 2020-06-11 NOTE — Anesthesia Preprocedure Evaluation (Addendum)
Anesthesia Evaluation  Patient identified by MRN, date of birth, ID band Patient awake    Reviewed: Allergy & Precautions, NPO status , Patient's Chart, lab work & pertinent test results, reviewed documented beta blocker date and time   Airway Mallampati: II  TM Distance: >3 FB Neck ROM: Full    Dental  (+) Teeth Intact, Dental Advisory Given   Pulmonary asthma ,  Status post partial lobectomy of lung   Pulmonary exam normal breath sounds clear to auscultation       Cardiovascular hypertension, Pt. on home beta blockers and Pt. on medications Normal cardiovascular exam Rhythm:Regular Rate:Normal     Neuro/Psych  Headaches, TIA   GI/Hepatic Neg liver ROS, GERD  ,  Endo/Other  negative endocrine ROS  Renal/GU Renal InsufficiencyRenal disease   Bladder lesion    Musculoskeletal  (+) Arthritis ,   Abdominal   Peds  Hematology negative hematology ROS (+)   Anesthesia Other Findings Day of surgery medications reviewed with the patient.  Reproductive/Obstetrics                             Anesthesia Physical Anesthesia Plan  ASA: III  Anesthesia Plan: General   Post-op Pain Management:    Induction: Intravenous  PONV Risk Score and Plan: 4 or greater and Dexamethasone, Ondansetron and Treatment may vary due to age or medical condition  Airway Management Planned: LMA  Additional Equipment:   Intra-op Plan:   Post-operative Plan: Extubation in OR  Informed Consent: I have reviewed the patients History and Physical, chart, labs and discussed the procedure including the risks, benefits and alternatives for the proposed anesthesia with the patient or authorized representative who has indicated his/her understanding and acceptance.     Dental advisory given  Plan Discussed with: CRNA  Anesthesia Plan Comments:         Anesthesia Quick Evaluation

## 2020-06-11 NOTE — Transfer of Care (Signed)
Immediate Anesthesia Transfer of Care Note  Patient: Madison Erickson  Procedure(s) Performed: CYSTOSCOPY WITH BLADDER BIOPSY AND FULGURATION (N/A Bladder)  Patient Location: PACU  Anesthesia Type:General  Level of Consciousness: awake, alert  and oriented  Airway & Oxygen Therapy: Patient Spontanous Breathing and Patient connected to nasal cannula oxygen  Post-op Assessment: Report given to RN  Post vital signs: Reviewed and stable  Last Vitals:  Vitals Value Taken Time  BP 147/78 06/11/20 0802  Temp    Pulse 75 06/11/20 0803  Resp 13 06/11/20 0803  SpO2 100 % 06/11/20 0803  Vitals shown include unvalidated device data.  Last Pain:  Vitals:   06/11/20 0559  TempSrc: Oral  PainSc: 0-No pain      Patients Stated Pain Goal: 7 (76/54/65 0354)  Complications: No complications documented.

## 2020-06-11 NOTE — Discharge Instructions (Addendum)
Post Anesthesia Home Care Instructions  Activity: Get plenty of rest for the remainder of the day. A responsible adult should stay with you for 24 hours following the procedure.  For the next 24 hours, DO NOT: -Drive a car -Paediatric nurse -Drink alcoholic beverages -Take any medication unless instructed by your physician -Make any legal decisions or sign important papers.  Meals: Start with liquid foods such as gelatin or soup. Progress to regular foods as tolerated. Avoid greasy, spicy, heavy foods. If nausea and/or vomiting occur, drink only clear liquids until the nausea and/or vomiting subsides. Call your physician if vomiting continues.  Special Instructions/Symptoms: Your throat may feel dry or sore from the anesthesia or the breathing tube placed in your throat during surgery. If this causes discomfort, gargle with warm salt water. The discomfort should disappear within 24 hours.  If you had a scopolamine patch placed behind your ear for the management of post- operative nausea and/or vomiting:  1. The medication in the patch is effective for 72 hours, after which it should be removed.  Wrap patch in a tissue and discard in the trash. Wash hands thoroughly with soap and water. 2. You may remove the patch earlier than 72 hours if you experience unpleasant side effects which may include dry mouth, dizziness or visual disturbances. 3. Avoid touching the patch. Wash your hands with soap and water after contact with the patch.   CYSTOSCOPY HOME CARE INSTRUCTIONS  Activity: Rest for the remainder of the day.  Do not drive or operate equipment today.  You may resume normal activities in one to two days as instructed by your physician.   Meals: Drink plenty of liquids and eat light foods such as gelatin or soup this evening.  You may return to a normal meal plan tomorrow.  Return to Work: You may return to work in one to two days or as instructed by your physician.  Special  Instructions / Symptoms: Call your physician if any of these symptoms occur:   -persistent or heavy bleeding  -bleeding which continues after first few urination  -large blood clots that are difficult to pass  -urine stream diminishes or stops completely  -fever equal to or higher than 101 degrees Farenheit.  -cloudy urine with a strong, foul odor  -severe pain  Females should always wipe from front to back after elimination.  You may feel some burning pain when you urinate.  This should disappear with time.  Applying moist heat to the lower abdomen or a hot tub bath may help relieve the pain. \  Indwelling Urinary Catheter Care, Adult An indwelling urinary catheter is a thin, flexible, germ-free (sterile) tube that is placed into the bladder to help drain urine out of the body. The catheter is inserted into the part of the body that drains urine from the bladder (urethra). Urine drains from the catheter into a drainage bag outside of the body. Taking good care of your catheter will keep it working properly and help to prevent problems from developing. What are the risks?  Bacteria may get into your bladder and cause a urinary tract infection.  Urine flow can become blocked. This can happen if the catheter is not working correctly, or if you have sediment or a blood clot in your bladder or the catheter.  Tissue near the catheter may become irritated and bleed. How to wear your catheter and your drainage bag Supplies needed  Adhesive tape or a leg strap.  Alcohol wipe or  soap and water (if you use tape).  A clean towel (if you use tape).  Overnight drainage bag.  Smaller drainage bag (leg bag). Wearing your catheter and bag Use adhesive tape or a leg strap to attach your catheter to your leg.  Make sure the catheter is not pulled tight.  If a leg strap gets wet, replace it with a dry one.  If you use adhesive tape: 1. Use an alcohol wipe or soap and water to wash off any  stickiness on your skin where you had tape before. 2. Use a clean towel to pat-dry the area. 3. Apply the new tape. You should have received a large overnight drainage bag and a smaller leg bag that fits underneath clothing.  You may wear the overnight bag at any time, but you should not wear the leg bag at night.  Always wear the leg bag below your knee.  Make sure the overnight drainage bag is always lower than the level of your bladder, but do not let it touch the floor. Before you go to sleep, hang the bag inside a wastebasket that is covered by a clean plastic bag. How to care for your skin around the catheter Supplies needed  A clean washcloth.  Water and mild soap.  A clean towel. Caring for your skin and catheter  Every day, use a clean washcloth and soapy water to clean the skin around your catheter. 1. Wash your hands with soap and water. 2. Wet a washcloth in warm water and mild soap. 3. Clean the skin around your urethra.  If you are female:  Use one hand to gently spread the folds of skin around your vagina (labia).  With the washcloth in your other hand, wipe the inner side of your labia on each side. Do this in a front-to-back direction.  If you are female:  Use one hand to pull back any skin that covers the end of your penis (foreskin).  With the washcloth in your other hand, wipe your penis in small circles. Start wiping at the tip of your penis, then move outward from the catheter.  Move the foreskin back in place, if this applies. 4. With your free hand, hold the catheter close to where it enters your body. Keep holding the catheter during cleaning so it does not get pulled out. 5. Use your other hand to clean the catheter with the washcloth.  Only wipe downward on the catheter.  Do not wipe upward toward your body, because that may push bacteria into your urethra and cause infection. 6. Use a clean towel to pat-dry the catheter and the skin around it.  Make sure to wipe off all soap. 7. Wash your hands with soap and water.  Shower every day. Do not take baths.  Do not use cream, ointment, or lotion on the area where the catheter enters your body, unless your health care provider tells you to do that.  Do not use powders, sprays, or lotions on your genital area.  Check your skin around the catheter every day for signs of infection. Check for: ? Redness, swelling, or pain. ? Fluid or blood. ? Warmth. ? Pus or a bad smell.      How to empty the drainage bag Supplies needed  Rubbing alcohol.  Gauze pad or cotton ball.  Adhesive tape or a leg strap. Emptying the bag Empty your drainage bag (your overnight drainage bag or your leg bag) when it is ?- full, or  at least 2-3 times a day. Clean the drainage bag according to the manufacturer's instructions or as told by your health care provider. 1. Wash your hands with soap and water. 2. Detach the drainage bag from your leg. 3. Hold the drainage bag over the toilet or a clean container. Make sure the drainage bag is lower than your hips and bladder. This stops urine from going back into the tubing and into your bladder. 4. Open the pour spout at the bottom of the bag. 5. Empty the urine into the toilet or container. Do not let the pour spout touch any surface. This precaution is important to prevent bacteria from getting in the bag and causing infection. 6. Apply rubbing alcohol to a gauze pad or cotton ball. 7. Use the gauze pad or cotton ball to clean the pour spout. 8. Close the pour spout. 9. Attach the bag to your leg with adhesive tape or a leg strap. 10. Wash your hands with soap and water. How to change the drainage bag Supplies needed:  Alcohol wipes.  A clean drainage bag.  Adhesive tape or a leg strap. Changing the bag Replace your drainage bag with a clean bag if it leaks, starts to smell bad, or looks dirty. 1. Wash your hands with soap and water. 2. Detach the  dirty drainage bag from your leg. 3. Pinch the catheter with your fingers so that urine does not spill out. 4. Disconnect the catheter tube from the drainage tube at the connection valve. Do not let the tubes touch any surface. 5. Clean the end of the catheter tube with an alcohol wipe. Use a different alcohol wipe to clean the end of the drainage tube. 6. Connect the catheter tube to the drainage tube of the clean bag. 7. Attach the clean bag to your leg with adhesive tape or a leg strap. Avoid attaching the new bag too tightly. 8. Wash your hands with soap and water. General instructions  Never pull on your catheter or try to remove it. Pulling can damage your internal tissues.  Always wash your hands before and after you handle your catheter or drainage bag. Use a mild, fragrance-free soap. If soap and water are not available, use hand sanitizer.  Always make sure there are no twists or bends (kinks) in the catheter tube.  Always make sure there are no leaks in the catheter or drainage bag.  Drink enough fluid to keep your urine pale yellow.  Do not take baths, swim, or use a hot tub.  If you are female, wipe from front to back after having a bowel movement.   Contact a health care provider if:  Your urine is cloudy.  Your urine smells unusually bad.  Your catheter gets clogged.  Your catheter starts to leak.  Your bladder feels full. Get help right away if:  You have redness, swelling, or pain where the catheter enters your body.  You have fluid, blood, pus, or a bad smell coming from the area where the catheter enters your body.  The area where the catheter enters your body feels warm to the touch.  You have a fever.  You have pain in your abdomen, legs, lower back, or bladder.  You see blood in the catheter.  Your urine is pink or red.  You have nausea, vomiting, or chills.  Your urine is not draining into the bag.  Your catheter gets pulled  out. Summary  An indwelling urinary catheter is a thin,  flexible, germ-free (sterile) tube that is placed into the bladder to help drain urine out of the body.  The catheter is inserted into the part of the body that drains urine from the bladder (urethra).  Take good care of your catheter to keep it working properly and help prevent problems from developing.  Always wash your hands before and after you handle your catheter or drainage bag.  You may remove the catheter at home in the morning if you feel comfortable doing that or you may call the office to come have it removed.   To remove the catheter, cut of the red nipple on the side arm with scissors and after the fluid drains, the catheter should just slide out.     This information is not intended to replace advice given to you by your health care provider. Make sure you discuss any questions you have with your health care provider. Document Revised: 03/17/2019 Document Reviewed: 08/25/2016 Elsevier Patient Education  2021 [employer].   Patient Signature:  ________________________________________________________  Nurse's Signature:  ________________________________________________________

## 2020-06-14 ENCOUNTER — Encounter (HOSPITAL_BASED_OUTPATIENT_CLINIC_OR_DEPARTMENT_OTHER): Payer: Self-pay | Admitting: Urology

## 2020-06-14 LAB — SURGICAL PATHOLOGY

## 2020-06-19 IMAGING — MR MR MRA HEAD W/O CM
9 of 12 series · 30 of 48 positions shown · non-contrast
Comparison: 10/12/2017 CT head.

CLINICAL DATA: 76 y/o F; right facial numbness, difficulty
speaking, vision change.

EXAM:
MRI HEAD WITHOUT CONTRAST
MRA HEAD WITHOUT CONTRAST
TECHNIQUE: Multiplanar, multiecho pulse sequences of the brain and surrounding
structures were obtained without intravenous contrast. Angiographic
images of the head were obtained using MRA technique without
contrast.

[Series 3: DWI · axial · 3.0mm · 0.94mm/px · z∈[-50,+97]mm · 7 of 100 slices shown (1 of 2)]
[im 1/100]
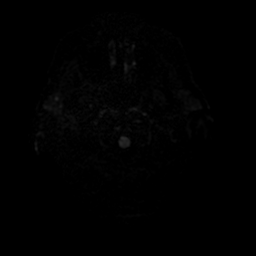
[im 17/100]
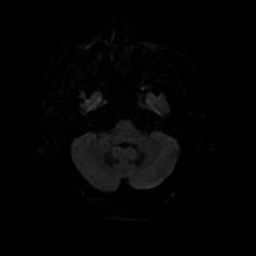
[im 34/100]
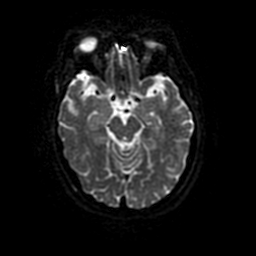
[im 50/100]
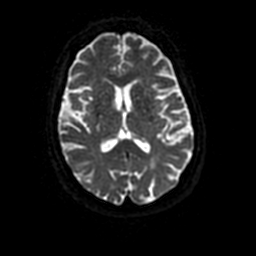
[im 67/100]
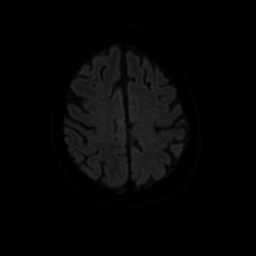
[im 83/100]
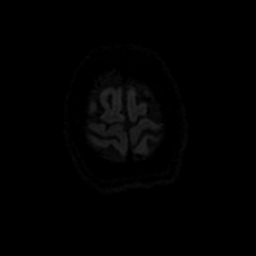
[im 100/100]
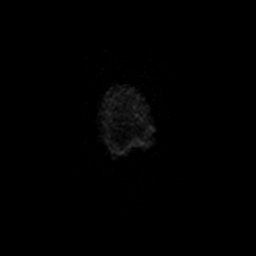

[Series 4: DWI · coronal · 4.0mm · 0.94mm/px · 4 of 72 slices shown (2 of 2)]
[im 1/72]
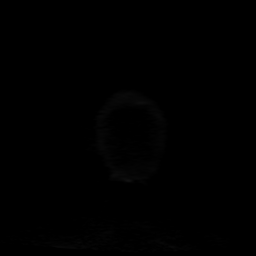
[im 24/72]
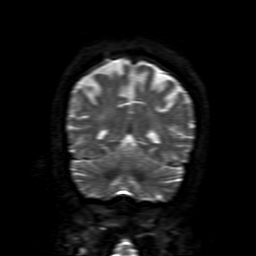
[im 48/72]
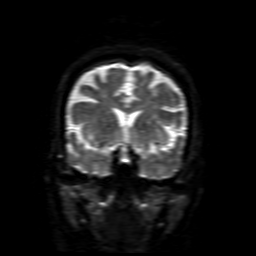
[im 72/72]
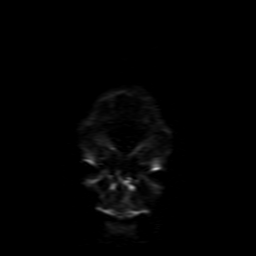

[Series 5: ax (id) 2 · axial · 1.0mm · 0.43mm/px · z∈[-54,+38]mm · 8 of 184 slices shown]
[im 1/184]
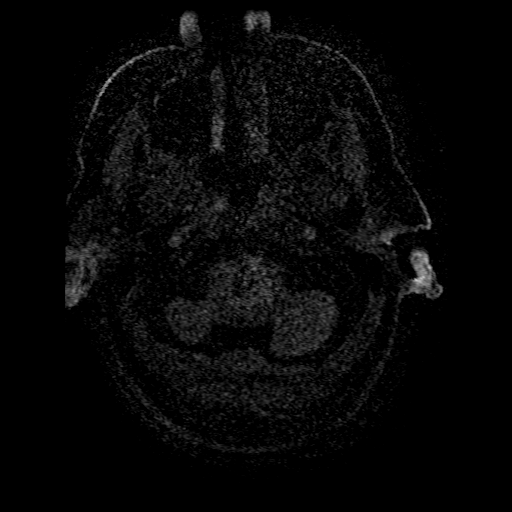
[im 37/184]
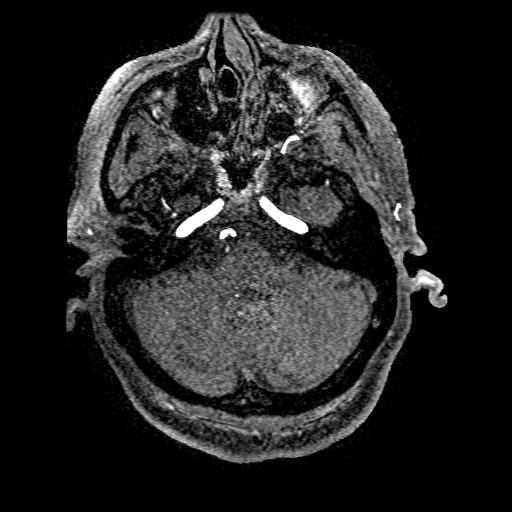
[im 55/184]
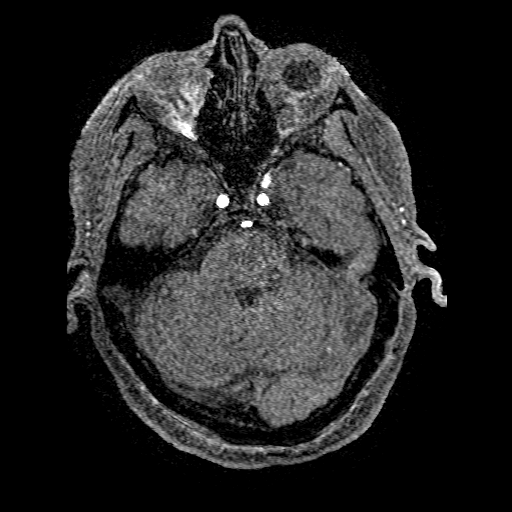
[im 74/184]
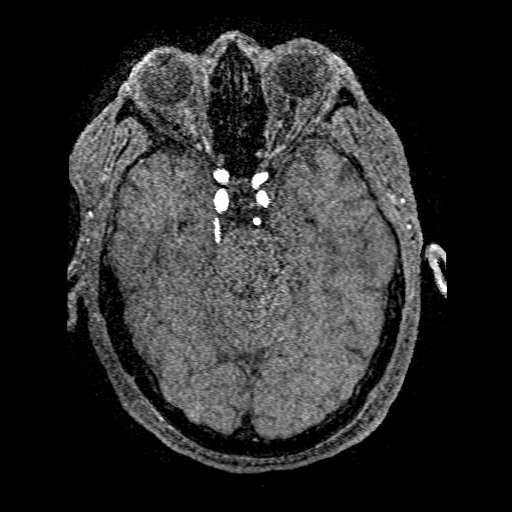
[im 110/184]
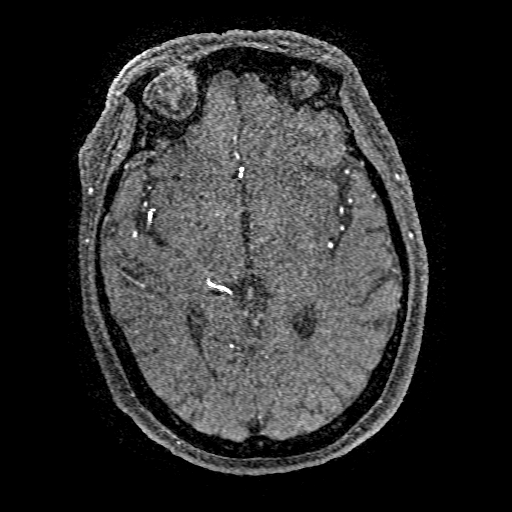
[im 129/184]
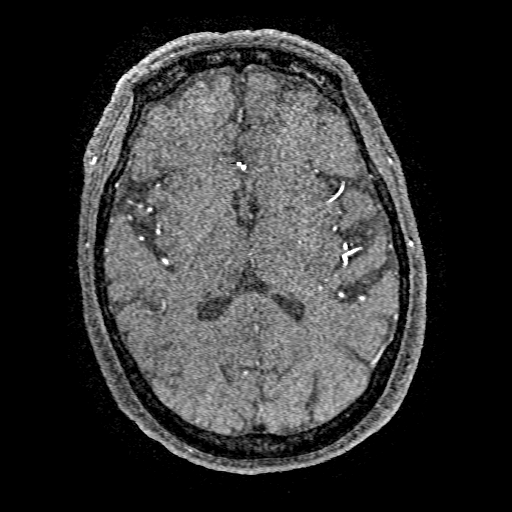
[im 147/184]
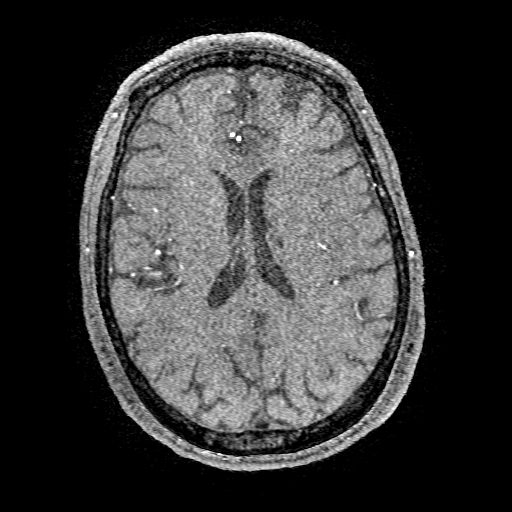
[im 184/184]
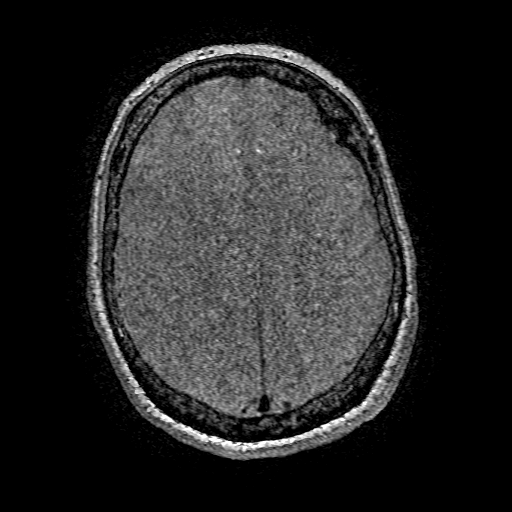

[Series 6: FLAIR · sagittal · 5.0mm · 0.47mm/px · 1 of 25 slices shown (1 of 2)]
[im 1/25]
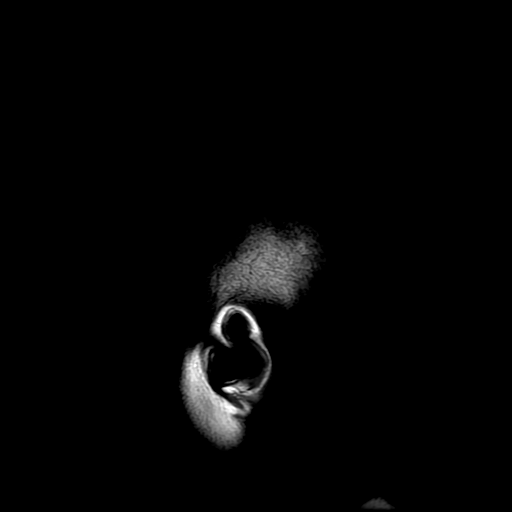

[Series 7: T2 · axial · 5.0mm · 0.47mm/px · z∈[-65,+103]mm · 2 of 29 slices shown (1 of 2)]
[im 1/29]
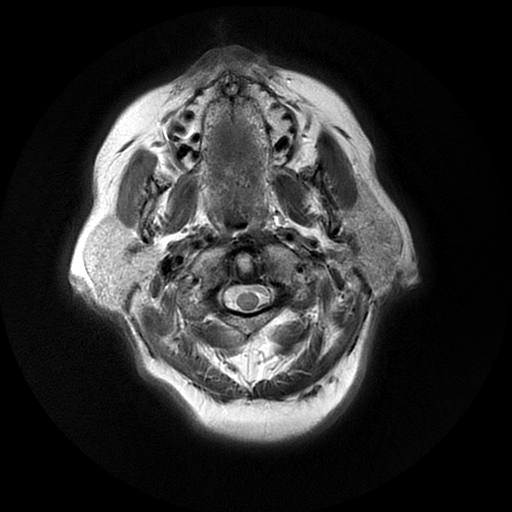
[im 29/29]
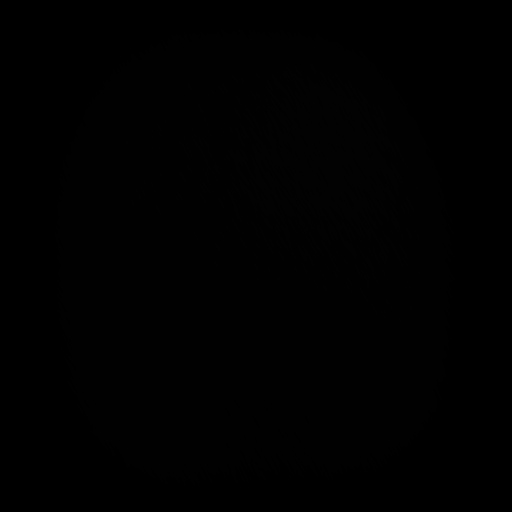

[Series 8: FLAIR · axial · 3.0mm · 0.45mm/px · 1 of 25 slices shown (2 of 2)]
[im 1/25]
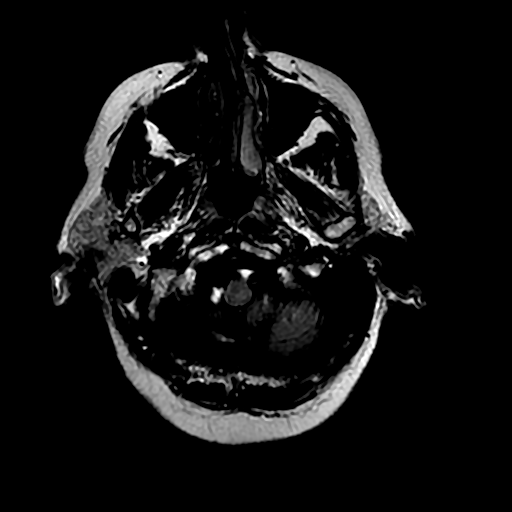

[Series 11: T2 · coronal · 5.0mm · 0.39mm/px · 2 of 31 slices shown (2 of 2)]
[im 1/31]
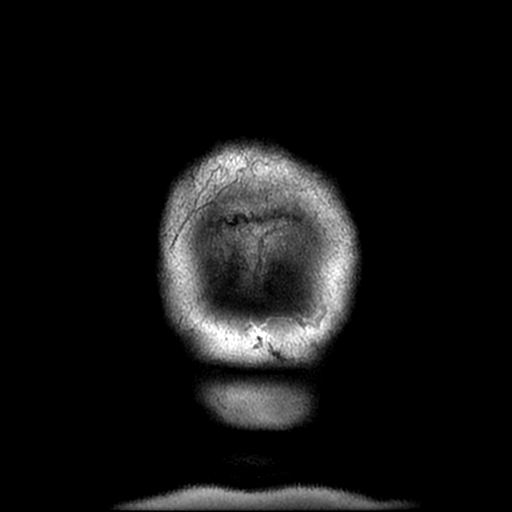
[im 31/31]
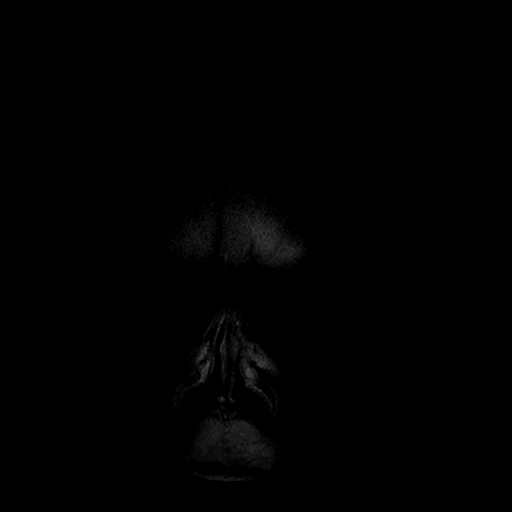

[Series 350: ADC · axial · 3.0mm · 0.94mm/px · z∈[-50,+97]mm · 3 of 49 slices shown (1 of 2)]
[im 1/49]
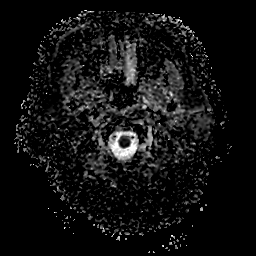
[im 25/49]
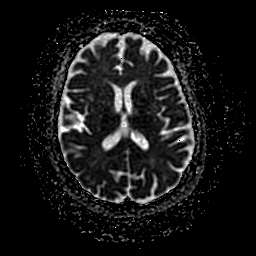
[im 49/49]
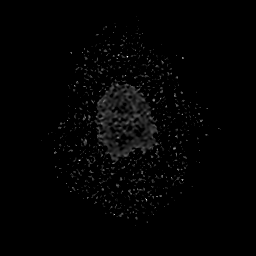

[Series 450: ADC · coronal · 4.0mm · 0.94mm/px · 2 of 36 slices shown (2 of 2)]
[im 1/36]
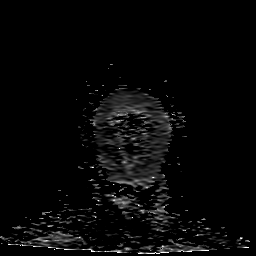
[im 36/36]
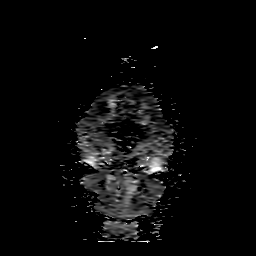

[30 of 48 positions shown; findings below may reference images not displayed]

FINDINGS: MRI HEAD FINDINGS

Brain: No acute infarction, hemorrhage, hydrocephalus, extra-axial
collection or mass lesion of the brain. Left frontal dural nodule
measuring up to 15 mm (series 11, image 24). Several nonspecific T2
FLAIR hyperintensities in subcortical and periventricular white
matter are compatible with moderate chronic microvascular ischemic
changes for age. Mild volume loss of the brain.

Vascular: As below.

Skull and upper cervical spine: Normal marrow signal.

Sinuses/Orbits: Negative.

Other: Bilateral intra-ocular lens replacement.

MRA HEAD FINDINGS

Internal carotid arteries:  Patent.

Anterior cerebral arteries:  Patent.

Middle cerebral arteries: Patent.

Anterior communicating artery: Patent.

Posterior communicating arteries: Right fetal PCA. No left posterior
communicating artery identified, likely hypoplastic or absent.

Posterior cerebral arteries:  Patent.

Basilar artery:  Patent.

Vertebral arteries:  Patent.

No evidence of high-grade stenosis, large vessel occlusion, or
aneurysm unless noted above.
IMPRESSION: 1. No acute intracranial abnormality identified.
2. Moderate chronic microvascular ischemic changes and mild volume
loss of the brain.
3. Normal MRA of the head.

By: Shani Babb M.D.

## 2020-06-23 DIAGNOSIS — R3121 Asymptomatic microscopic hematuria: Secondary | ICD-10-CM | POA: Diagnosis not present

## 2020-06-23 DIAGNOSIS — N3021 Other chronic cystitis with hematuria: Secondary | ICD-10-CM | POA: Diagnosis not present

## 2020-08-02 DIAGNOSIS — D1801 Hemangioma of skin and subcutaneous tissue: Secondary | ICD-10-CM | POA: Diagnosis not present

## 2020-08-02 DIAGNOSIS — Z85828 Personal history of other malignant neoplasm of skin: Secondary | ICD-10-CM | POA: Diagnosis not present

## 2020-08-02 DIAGNOSIS — L821 Other seborrheic keratosis: Secondary | ICD-10-CM | POA: Diagnosis not present

## 2020-08-02 DIAGNOSIS — L820 Inflamed seborrheic keratosis: Secondary | ICD-10-CM | POA: Diagnosis not present

## 2020-08-02 DIAGNOSIS — D225 Melanocytic nevi of trunk: Secondary | ICD-10-CM | POA: Diagnosis not present

## 2020-09-07 ENCOUNTER — Other Ambulatory Visit: Payer: Self-pay

## 2020-09-07 ENCOUNTER — Telehealth: Payer: Self-pay

## 2020-09-07 ENCOUNTER — Telehealth: Payer: Self-pay | Admitting: Internal Medicine

## 2020-09-07 MED ORDER — AMLODIPINE BESYLATE 5 MG PO TABS: 5.0000 mg | ORAL_TABLET | Freq: Every day | ORAL | 1 refills | Status: DC

## 2020-09-07 MED ORDER — ROSUVASTATIN CALCIUM 10 MG PO TABS: ORAL_TABLET | ORAL | 1 refills | Status: DC

## 2020-09-07 MED ORDER — HYDROCHLOROTHIAZIDE 25 MG PO TABS: 25.0000 mg | ORAL_TABLET | Freq: Every day | ORAL | 1 refills | Status: DC

## 2020-09-07 MED ORDER — METOPROLOL SUCCINATE ER 50 MG PO TB24: ORAL_TABLET | ORAL | 1 refills | Status: DC

## 2020-09-07 NOTE — Addendum Note (Signed)
Addended by: Nilda Riggs on: 09/07/2020 02:32 PM   Modules accepted: Orders

## 2020-09-07 NOTE — Telephone Encounter (Signed)
Rx sent to pt's pharmacy

## 2020-09-07 NOTE — Telephone Encounter (Signed)
Pt call and stated she need refill's on amLODipine (NORVASC) 5 MG tablet ,hydrochlorothiazide (HYDRODIURIL) 25 MG tablet ,rosuvastatin (CRESTOR) 10 MG tablet  sent to  [facility] [id number], [place] - [address] AT [facility] [address] Phone:  [phone]  Fax:  [phone]

## 2020-09-07 NOTE — Telephone Encounter (Signed)
Pt called this afternoon state that her Rx were sent to the wrong pharmacy, pt [facility]. Rx sent

## 2020-09-16 ENCOUNTER — Other Ambulatory Visit: Payer: Self-pay

## 2020-09-16 NOTE — Telephone Encounter (Signed)
Ok to refill x 2  

## 2020-09-17 MED ORDER — METRONIDAZOLE 0.75 % EX LOTN: TOPICAL_LOTION | CUTANEOUS | 2 refills | Status: DC

## 2020-09-29 ENCOUNTER — Other Ambulatory Visit: Payer: Self-pay

## 2020-09-29 MED ORDER — METRONIDAZOLE 0.75 % EX LOTN: TOPICAL_LOTION | CUTANEOUS | 2 refills | Status: AC

## 2020-10-14 NOTE — Telephone Encounter (Signed)
Pt chart has been updated.

## 2020-10-21 ENCOUNTER — Other Ambulatory Visit: Payer: Self-pay | Admitting: Obstetrics and Gynecology

## 2020-10-21 DIAGNOSIS — Z1231 Encounter for screening mammogram for malignant neoplasm of breast: Secondary | ICD-10-CM

## 2020-11-21 NOTE — Progress Notes (Signed)
Chief Complaint  Patient presents with   Annual Exam     HPI: Patient  Madison Erickson  80 y.o. comes in today yearly visit   is fasting  HLD : hx poss tia  on Crestor  no se noted BP :on hctz and amlodipine  metoprolol  no allergy sx asks about  hctz and allergy to sulfa possibles he has no sx  GU saw urologist had benign bladder tumor and is on prn fu  Heels hurt has inflammation of achilles   likes to go barefoot  r more than left  ? Who to see about this.   Health Maintenance  Topic Date Due   TETANUS/TDAP  02/12/2022   Pneumonia Vaccine 58+ Years old  Completed   INFLUENZA VACCINE  Completed   DEXA SCAN  Completed   COVID-19 Vaccine  Completed   Zoster Vaccines- Shingrix  Completed   HPV VACCINES  Aged Out  Hh of 2 getting older  ocass cough maybe from reflux  off ppi  no sob  with this.    Feels from drainage.  ROS:  REST of 12 system review negative except as per HPI   Past Medical History:  Diagnosis Date   Abnormal LFTs    fatty liver on [place] MRI faay liver and hemangioma 2008 biopsy 2011   Asthma    allergy   Benign tumor    Bladder polyps    Blood transfusion 1969   Blood transfusion abn reaction or complication, no procedure mishap    Cancer ([place])    squameous cell lsft leg hx   Colon polyp 2009   Tubulovillous adenoma    Diverticulosis of colon    Eczema    Fatty liver disease, nonalcoholic    confirmed biopsy 2011 mild no fibrosis    Hematuria    urethral polyps   History of migraine    none in years   HT (hammer toe) right   Hyperlipidemia    Hypertension    Kidney disease    nephritis as  child. polpys, hematuria   Lesion of bladder    Lichen sclerosus    gyne care topical steroids   Osteoarthritis    Shortness of breath    partial lower rt lung lobectomy.   Status post partial lobectomy of lung    for bronchiesctesis   Urinary incontinence    UTI (lower urinary tract infection)    Wears glasses     Past Surgical History:   Procedure Laterality Date   APPENDECTOMY  8th grade   caesarean section     X 3   CYSTOSCOPY WITH BIOPSY N/A 06/11/2020   Procedure: CYSTOSCOPY WITH BLADDER BIOPSY AND FULGURATION;  Surgeon: [name], MD;  Location: [facility];  Service: Urology;  Laterality: N/A;   KNEE ARTHROSCOPY  2014   left    left knee surgery on torn mansicus  2011   LUMBAR LAMINECTOMY/DECOMPRESSION MICRODISCECTOMY  03/23/2011   Procedure: LUMBAR LAMINECTOMY/DECOMPRESSION MICRODISCECTOMY 1 LEVEL;  Surgeon: Peggyann Shoals, MD;  Location: [facility];  Service: Neurosurgery;  Laterality: Left;  Left Lumbar four-five laminectomy and microdiscectomy   PERCUTANEOUS LIVER BIOPSY  2011   fatty liver no fibrosis   rt knee surgery  2012   rt lung lobectomy  1969   for bronchiectasis   VAGINAL HYSTERECTOMY  1974   partial    Family History  Problem Relation Age of Onset   Cancer Mother  lung   Heart disease Mother    Diabetes Paternal Grandfather    Cancer Paternal Grandfather        bladder   Mental retardation Other    Breast cancer Neg Hx     Social History   Socioeconomic History   Marital status: Married    Spouse name: Not on file   Number of children: Not on file   Years of education: Not on file   Highest education level: Not on file  Occupational History   Not on file  Tobacco Use   Smoking status: Never   Smokeless tobacco: Never  Vaping Use   Vaping Use: Never used  Substance and Sexual Activity   Alcohol use: Yes    Alcohol/week: 0.0 standard drinks    Comment: 4-5 per year    Drug use: No   Sexual activity: Not on file    Comment: hysterectomy  Other Topics Concern   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 90 tablet 1   clobetasol (TEMOVATE) 0.05 % cream Apply 1 application topically as needed.      hydrochlorothiazide (HYDRODIURIL) 25 MG tablet Take 1 tablet (25 mg total) by mouth daily. 90 tablet 1   metoprolol succinate (TOPROL XL) 50 MG 24 hr tablet TAKE 1 TABLET DAILY WITH OR IMMEDIATELY FOLLOWING A MEAL 90 tablet 1   METRONIDAZOLE, TOPICAL, 0.75 % LOTN Apply to face once a day as directed 59 mL 2   rosuvastatin (CRESTOR) 10 MG tablet TAKE 1 TABLET DAILY 90 tablet 1   traMADol (ULTRAM) 50 MG tablet Take 1 tablet (50 mg total) by mouth every 6 (six) hours as needed. 4 tablet 0   No facility-administered medications prior to visit.     EXAM:  BP 136/70 (BP Location: Left Arm, Patient Position: Sitting, Cuff Size: Normal)   Pulse 81   Temp 98.8 F (37.1 C) (Oral)   Ht 5\' 2"  (1.575 m)   Wt 150 lb 9.6 oz (68.3 kg)   SpO2 95%   BMI 27.55 kg/m   Body mass index is 27.55 kg/m. Wt Readings from Last 3 Encounters:  11/22/20 150 lb 9.6 oz (68.3 kg)  06/11/20 155 lb 6.4 oz (70.5 kg)  11/21/19 152 lb (68.9 kg)    Physical Exam: Vital signs reviewed DVV:OHYW is a well-developed well-nourished alert cooperative    who appearsr stated age  or  younger in no acute distress.  HEENT: normocephalic atraumatic , Eyes: PERRL EOM's full, conjunctiva clear, ddischarge  Ears:  EAC's clear TMs with normal landmarks. Mouth: masked  NECK: supple without masses, thyromegaly or bruits. CHEST/PULM:  Clear to auscultation and percussion breath sounds equal no wheeze , rales or rhonchi. Breast: normal by inspection . No dimpling, discharge, masses, tenderness or discharge . CV: PMI  is nondisplaced, S1 S2 no gallops, murmurs, rubs. Peripheral pulses are present without delay.No JVD .  ABDOMEN: Bowel sounds normal nontender  No guard or rebound, no hepato splenomegal no CVA tenderness.   Extremtities:  No clubbing cyanosis or edema, bilateral large pump bump  and tenderness  at achilles  no joint swelling obvious or rash  NEURO:  Oriented x3, cranial nerves 3-12 appear to be intact, no obvious focal weakness,gait within normal limits no abnormal reflexes or asymmetrical SKIN: No acute rashes normal turgor, color, no bruising or petechiae. PSYCH: Oriented, good eye contact, no obvious depression anxiety, cognition and judgment appear normal. LN: no cervical axillary adenopathy  Lab Results  Component Value Date   WBC 10.6 11/21/2019   HGB 15.0 06/11/2020   HCT 44.0 06/11/2020   PLT 274 11/21/2019   GLUCOSE 140 (H) 06/11/2020   CHOL 134 11/21/2019   TRIG 185 (H) 11/21/2019   HDL 35 (L) 11/21/2019   LDLDIRECT 201.9 10/26/2008   LDLCALC 73 11/21/2019   ALT 16 11/21/2019   AST 17 11/21/2019   NA 141 06/11/2020   K 3.3 (L) 06/11/2020   CL 102 06/11/2020   CREATININE 0.50 06/11/2020   BUN 15 06/11/2020   CO2 27 11/21/2019   TSH 1.44 11/21/2019   INR 1.02 10/12/2017   HGBA1C 6.0 (H) 11/21/2019    BP Readings from Last 3 Encounters:  11/22/20 136/70  06/11/20 132/63  11/21/19 132/70    Lab plan  reviewed with patient  is fasting in afternoon   ASSESSMENT AND PLAN:  Discussed the following assessment and plan:    ICD-10-CM   1. Essential hypertension  J94 Basic metabolic panel    CBC with Differential/Platelet    Hemoglobin A1c    Hepatic function panel    Lipid panel    TSH    POCT Urinalysis Dipstick (Automated)    TSH    Lipid panel    Hepatic function panel    Hemoglobin A1c    CBC with Differential/Platelet    Basic metabolic panel    2. Hyperlipidemia, unspecified hyperlipidemia type  R74.0 Basic metabolic panel    CBC with  Differential/Platelet    Hemoglobin A1c    Hepatic function panel    Lipid panel    TSH    POCT Urinalysis Dipstick (Automated)    TSH    Lipid panel    Hepatic function panel    Hemoglobin A1c    CBC with Differential/Platelet    Basic metabolic panel    3. Medication management  C14.481 Basic metabolic panel    CBC with Differential/Platelet    Hemoglobin A1c    Hepatic function panel    Lipid panel    TSH    POCT Urinalysis Dipstick (Automated)    TSH    Lipid panel    Hepatic function panel    Hemoglobin A1c    CBC with Differential/Platelet    Basic metabolic panel    4. Fasting hyperglycemia  E56.31 Basic metabolic panel    CBC with Differential/Platelet    Hemoglobin A1c    Hepatic function panel    Lipid panel    TSH    POCT Urinalysis Dipstick (Automated)    TSH    Lipid panel    Hepatic function panel    Hemoglobin A1c    CBC with Differential/Platelet    Basic metabolic panel    5. History of recurrent UTIs  S97.026 Basic metabolic panel  CBC with Differential/Platelet    Hemoglobin A1c    Hepatic function panel    Lipid panel    TSH    POCT Urinalysis Dipstick (Automated)    TSH    Lipid panel    Hepatic function panel    Hemoglobin A1c    CBC with Differential/Platelet    Basic metabolic panel    POCT Urinalysis Dipstick (Automated)    6. History of transient ischemic attack (TIA)  J44.92 Basic metabolic panel    CBC with Differential/Platelet    Hemoglobin A1c    Hepatic function panel    Lipid panel    TSH    POCT Urinalysis Dipstick (Automated)    TSH    Lipid panel    Hepatic function panel    Hemoglobin A1c    CBC with Differential/Platelet    Basic metabolic panel    7. Pain in Achilles tendon  E10.07 Basic metabolic panel    CBC with Differential/Platelet    Hemoglobin A1c    Hepatic function panel    Lipid panel    TSH    POCT Urinalysis Dipstick (Automated)    TSH    Lipid panel    Hepatic function panel     Hemoglobin A1c    CBC with Differential/Platelet    Basic metabolic panel    Ambulatory referral to Orthopedic Surgery    8. Posterior heel bump  H21.97 Basic metabolic panel    CBC with Differential/Platelet    Hemoglobin A1c    Hepatic function panel    Lipid panel    TSH    POCT Urinalysis Dipstick (Automated)    TSH    Lipid panel    Hepatic function panel    Hemoglobin A1c    CBC with Differential/Platelet    Basic metabolic panel    Ambulatory referral to Orthopedic Surgery    9. Asymptomatic microscopic hematuria  R31.21     Agree needs follow-up evaluation she has had an Achilles problem for a while and now it is pretty prominent will refer to [facility] since she has been seen there for her shoulder advise she see the foot and ankle specialist.  She has a history of chronic hematuria recheck urine today  Updated labs for monitoring Blood pressure control with history of probable TIA. Uncertain if the irritative throat clearing type of cough is significant she was known to have cough related to reflux in the remote past  40 minutes  review evaluate counsel and plan  for fu of multiple issues  Return for depending on results 6-12 months .  Patient Care Team: Burnis Medin, MD as PCP - General Erline Levine, MD as Attending Physician (Neurosurgery) Paula Compton, MD as Attending Physician (Obstetrics and Gynecology) Gatha Mayer, MD as Attending Physician (Gastroenterology) Jarome Matin, MD as Consulting Physician (Dermatology) Keene Breath., MD (Ophthalmology) Pieter Partridge, DO as Consulting Physician (Neurology) Patient Instructions  Advise   fasting lab .  Can do fu urine.   Will do referral for the ankle achilles foot pain.   Fu depnding on lab results and how doing    Standley Brooking. Christyna Letendre M.D.

## 2020-11-22 ENCOUNTER — Other Ambulatory Visit: Payer: Self-pay

## 2020-11-22 ENCOUNTER — Ambulatory Visit (INDEPENDENT_AMBULATORY_CARE_PROVIDER_SITE_OTHER): Payer: Medicare Other | Admitting: Internal Medicine

## 2020-11-22 ENCOUNTER — Encounter: Payer: Self-pay | Admitting: Internal Medicine

## 2020-11-22 VITALS — BP 136/70 | HR 81 | Temp 98.8°F | Ht 62.0 in | Wt 150.6 lb

## 2020-11-22 DIAGNOSIS — R3121 Asymptomatic microscopic hematuria: Secondary | ICD-10-CM

## 2020-11-22 DIAGNOSIS — Z8673 Personal history of transient ischemic attack (TIA), and cerebral infarction without residual deficits: Secondary | ICD-10-CM | POA: Diagnosis not present

## 2020-11-22 DIAGNOSIS — Z79899 Other long term (current) drug therapy: Secondary | ICD-10-CM | POA: Diagnosis not present

## 2020-11-22 DIAGNOSIS — I10 Essential (primary) hypertension: Secondary | ICD-10-CM

## 2020-11-22 DIAGNOSIS — E785 Hyperlipidemia, unspecified: Secondary | ICD-10-CM | POA: Diagnosis not present

## 2020-11-22 DIAGNOSIS — M7660 Achilles tendinitis, unspecified leg: Secondary | ICD-10-CM | POA: Diagnosis not present

## 2020-11-22 DIAGNOSIS — R7301 Impaired fasting glucose: Secondary | ICD-10-CM | POA: Diagnosis not present

## 2020-11-22 DIAGNOSIS — Z87440 Personal history of urinary (tract) infections: Secondary | ICD-10-CM | POA: Diagnosis not present

## 2020-11-22 DIAGNOSIS — R2240 Localized swelling, mass and lump, unspecified lower limb: Secondary | ICD-10-CM | POA: Diagnosis not present

## 2020-11-22 LAB — POC URINALSYSI DIPSTICK (AUTOMATED)
Bilirubin, UA: NEGATIVE
Blood, UA: POSITIVE
Glucose, UA: NEGATIVE
Ketones, UA: NEGATIVE
Leukocytes, UA: NEGATIVE
Nitrite, UA: NEGATIVE
Protein, UA: POSITIVE — AB
Spec Grav, UA: 1.02 (ref 1.010–1.025)
Urobilinogen, UA: 0.2 E.U./dL
pH, UA: 6 (ref 5.0–8.0)

## 2020-11-22 NOTE — Patient Instructions (Addendum)
Advise   fasting lab .  Can do fu urine.   Will do referral for the ankle achilles foot pain.   Fu depnding on lab results and how doing

## 2020-11-23 LAB — CBC WITH DIFFERENTIAL/PLATELET
Basophils Absolute: 0 10*3/uL (ref 0.0–0.1)
Basophils Relative: 0.4 % (ref 0.0–3.0)
Eosinophils Absolute: 0.2 10*3/uL (ref 0.0–0.7)
Eosinophils Relative: 1.6 % (ref 0.0–5.0)
HCT: 43.9 % (ref 36.0–46.0)
Hemoglobin: 15 g/dL (ref 12.0–15.0)
Lymphocytes Relative: 27.4 % (ref 12.0–46.0)
Lymphs Abs: 2.6 10*3/uL (ref 0.7–4.0)
MCHC: 34.2 g/dL (ref 30.0–36.0)
MCV: 86.4 fl (ref 78.0–100.0)
Monocytes Absolute: 0.7 10*3/uL (ref 0.1–1.0)
Monocytes Relative: 7.5 % (ref 3.0–12.0)
Neutro Abs: 6 10*3/uL (ref 1.4–7.7)
Neutrophils Relative %: 63.1 % (ref 43.0–77.0)
Platelets: 257 10*3/uL (ref 150.0–400.0)
RBC: 5.08 Mil/uL (ref 3.87–5.11)
RDW: 12.4 % (ref 11.5–15.5)
WBC: 9.6 10*3/uL (ref 4.0–10.5)

## 2020-11-23 LAB — HEPATIC FUNCTION PANEL
ALT: 28 U/L (ref 0–35)
AST: 28 U/L (ref 0–37)
Albumin: 4.8 g/dL (ref 3.5–5.2)
Alkaline Phosphatase: 50 U/L (ref 39–117)
Bilirubin, Direct: 0.1 mg/dL (ref 0.0–0.3)
Total Bilirubin: 0.6 mg/dL (ref 0.2–1.2)
Total Protein: 7.4 g/dL (ref 6.0–8.3)

## 2020-11-23 LAB — BASIC METABOLIC PANEL
BUN: 17 mg/dL (ref 6–23)
CO2: 29 mEq/L (ref 19–32)
Calcium: 10.5 mg/dL (ref 8.4–10.5)
Chloride: 100 mEq/L (ref 96–112)
Creatinine, Ser: 0.67 mg/dL (ref 0.40–1.20)
GFR: 82.74 mL/min (ref >60.00)
Glucose, Bld: 122 mg/dL — ABNORMAL HIGH (ref 70–99)
Potassium: 3.6 mEq/L (ref 3.5–5.1)
Sodium: 138 mEq/L (ref 135–145)

## 2020-11-23 LAB — LIPID PANEL
Cholesterol: 139 mg/dL (ref 0–200)
HDL: 35.4 mg/dL — ABNORMAL LOW (ref >39.00)
LDL Cholesterol: 77 mg/dL (ref 0–99)
NonHDL: 103.35
Total CHOL/HDL Ratio: 4
Triglycerides: 131 mg/dL (ref 0.0–149.0)
VLDL: 26.2 mg/dL (ref 0.0–40.0)

## 2020-11-23 LAB — HEMOGLOBIN A1C: Hgb A1c MFr Bld: 6.4 % (ref 4.6–6.5)

## 2020-11-23 LAB — TSH: TSH: 1.19 u[IU]/mL (ref 0.35–5.50)

## 2020-11-28 NOTE — Progress Notes (Signed)
Urine still has  blood in it  as in past . Rest of labs stabile blood sugar borderline elevated

## 2020-11-29 ENCOUNTER — Ambulatory Visit
Admission: RE | Admit: 2020-11-29 | Discharge: 2020-11-29 | Disposition: A | Discharge status: Home / Self Care | Payer: Medicare Other | Source: Ambulatory Visit | Attending: Obstetrics and Gynecology | Admitting: Obstetrics and Gynecology

## 2020-11-29 ENCOUNTER — Other Ambulatory Visit: Payer: Self-pay

## 2020-11-29 DIAGNOSIS — Z1231 Encounter for screening mammogram for malignant neoplasm of breast: Secondary | ICD-10-CM

## 2020-11-30 ENCOUNTER — Telehealth: Payer: Self-pay | Admitting: Internal Medicine

## 2020-11-30 ENCOUNTER — Other Ambulatory Visit: Payer: Self-pay

## 2020-11-30 MED ORDER — ROSUVASTATIN CALCIUM 10 MG PO TABS: ORAL_TABLET | ORAL | 3 refills | Status: DC

## 2020-11-30 MED ORDER — AMLODIPINE BESYLATE 5 MG PO TABS: 5.0000 mg | ORAL_TABLET | Freq: Every day | ORAL | 3 refills | Status: DC

## 2020-11-30 MED ORDER — METOPROLOL SUCCINATE ER 50 MG PO TB24: ORAL_TABLET | ORAL | 3 refills | Status: DC

## 2020-11-30 MED ORDER — HYDROCHLOROTHIAZIDE 25 MG PO TABS: 25.0000 mg | ORAL_TABLET | Freq: Every day | ORAL | 3 refills | Status: DC

## 2020-11-30 NOTE — Telephone Encounter (Signed)
Patient returning call to the office.

## 2020-11-30 NOTE — Telephone Encounter (Signed)
Please see result note 

## 2020-12-02 DIAGNOSIS — M2022 Hallux rigidus, left foot: Secondary | ICD-10-CM | POA: Diagnosis not present

## 2020-12-02 DIAGNOSIS — M67879 Other specified disorders of synovium and tendon, unspecified ankle and foot: Secondary | ICD-10-CM | POA: Diagnosis not present

## 2020-12-02 DIAGNOSIS — M2021 Hallux rigidus, right foot: Secondary | ICD-10-CM | POA: Diagnosis not present

## 2020-12-02 DIAGNOSIS — M79671 Pain in right foot: Secondary | ICD-10-CM | POA: Diagnosis not present

## 2020-12-02 DIAGNOSIS — M25572 Pain in left ankle and joints of left foot: Secondary | ICD-10-CM | POA: Diagnosis not present

## 2020-12-03 DIAGNOSIS — M67879 Other specified disorders of synovium and tendon, unspecified ankle and foot: Secondary | ICD-10-CM | POA: Diagnosis not present

## 2020-12-08 DIAGNOSIS — M67879 Other specified disorders of synovium and tendon, unspecified ankle and foot: Secondary | ICD-10-CM | POA: Diagnosis not present

## 2020-12-13 DIAGNOSIS — M67879 Other specified disorders of synovium and tendon, unspecified ankle and foot: Secondary | ICD-10-CM | POA: Diagnosis not present

## 2020-12-15 DIAGNOSIS — M67879 Other specified disorders of synovium and tendon, unspecified ankle and foot: Secondary | ICD-10-CM | POA: Diagnosis not present

## 2020-12-22 DIAGNOSIS — M67879 Other specified disorders of synovium and tendon, unspecified ankle and foot: Secondary | ICD-10-CM | POA: Diagnosis not present

## 2020-12-24 DIAGNOSIS — M67879 Other specified disorders of synovium and tendon, unspecified ankle and foot: Secondary | ICD-10-CM | POA: Diagnosis not present

## 2021-01-04 DIAGNOSIS — M67879 Other specified disorders of synovium and tendon, unspecified ankle and foot: Secondary | ICD-10-CM | POA: Diagnosis not present

## 2021-01-07 DIAGNOSIS — M67879 Other specified disorders of synovium and tendon, unspecified ankle and foot: Secondary | ICD-10-CM | POA: Diagnosis not present

## 2021-01-11 DIAGNOSIS — M67879 Other specified disorders of synovium and tendon, unspecified ankle and foot: Secondary | ICD-10-CM | POA: Diagnosis not present

## 2021-02-04 DIAGNOSIS — M79671 Pain in right foot: Secondary | ICD-10-CM | POA: Diagnosis not present

## 2021-02-04 DIAGNOSIS — M2022 Hallux rigidus, left foot: Secondary | ICD-10-CM | POA: Diagnosis not present

## 2021-02-04 DIAGNOSIS — M2021 Hallux rigidus, right foot: Secondary | ICD-10-CM | POA: Diagnosis not present

## 2021-02-04 DIAGNOSIS — M67879 Other specified disorders of synovium and tendon, unspecified ankle and foot: Secondary | ICD-10-CM | POA: Diagnosis not present

## 2021-02-17 DIAGNOSIS — M67879 Other specified disorders of synovium and tendon, unspecified ankle and foot: Secondary | ICD-10-CM | POA: Diagnosis not present

## 2021-02-25 DIAGNOSIS — M67879 Other specified disorders of synovium and tendon, unspecified ankle and foot: Secondary | ICD-10-CM | POA: Diagnosis not present

## 2021-02-28 DIAGNOSIS — M67879 Other specified disorders of synovium and tendon, unspecified ankle and foot: Secondary | ICD-10-CM | POA: Diagnosis not present

## 2021-03-02 DIAGNOSIS — M67879 Other specified disorders of synovium and tendon, unspecified ankle and foot: Secondary | ICD-10-CM | POA: Diagnosis not present

## 2021-03-14 ENCOUNTER — Telehealth: Payer: Self-pay | Admitting: Internal Medicine

## 2021-03-14 NOTE — Telephone Encounter (Signed)
Left message for patient to call back and schedule Medicare Annual Wellness Visit (AWV) either virtually or in office. Left  my Madison Erickson number [phone]   Last AWV 07/07/15  please schedule at anytime with [facility] Nurse Health Advisor 1 or 2   This should be a 45 minute visit.

## 2021-03-15 ENCOUNTER — Ambulatory Visit (INDEPENDENT_AMBULATORY_CARE_PROVIDER_SITE_OTHER): Payer: Medicare Other

## 2021-03-15 VITALS — Ht 62.0 in | Wt 149.0 lb

## 2021-03-15 DIAGNOSIS — Z0000 Encounter for general adult medical examination without abnormal findings: Secondary | ICD-10-CM | POA: Diagnosis not present

## 2021-03-15 NOTE — Patient Instructions (Signed)
[name] , Thank you for taking time to come for your Medicare Wellness Visit. I appreciate your ongoing commitment to your health goals. Please review the following plan we discussed and let me know if I can assist you in the future.   Screening recommendations/referrals: Colonoscopy: due now Mammogram: completed 11/30/2020, due 12/01/2021 Bone Density: completed 04/16/2013 Recommended yearly ophthalmology/optometry visit for glaucoma screening and checkup Recommended yearly dental visit for hygiene and checkup  Vaccinations: Influenza vaccine: completed 11/17/2020, due next flu season Pneumococcal vaccine: completed 07/07/2015 Tdap vaccine: completed 02/13/2012, due 02/12/2022 Shingles vaccine: completed   Covid-19: 10/18/2020, 10/10/2019, 02/26/2019, 02/06/2019  Advanced directives: Please bring a copy of your POA (Power of Attorney) and/or Living Will to your next appointment.   Conditions/risks identified: none  Next appointment: Follow up in one year for your annual wellness visit    Preventive Care 65 Years and Older, Female Preventive care refers to lifestyle choices and visits with your health care provider that can promote health and wellness. What does preventive care include? A yearly physical exam. This is also called an annual well check. Dental exams once or twice a year. Routine eye exams. Ask your health care provider how often you should have your eyes checked. Personal lifestyle choices, including: Daily care of your teeth and gums. Regular physical activity. Eating a healthy diet. Avoiding tobacco and drug use. Limiting alcohol use. Practicing safe sex. Taking low-dose aspirin every day. Taking vitamin and mineral supplements as recommended by your health care provider. What happens during an annual well check? The services and screenings done by your health care provider during your annual well check will depend on your age, overall health, lifestyle risk factors,  and family history of disease. Counseling  Your health care provider may ask you questions about your: Alcohol use. Tobacco use. Drug use. Emotional well-being. Home and relationship well-being. Sexual activity. Eating habits. History of falls. Memory and ability to understand (cognition). Work and work Statistician. Reproductive health. Screening  You may have the following tests or measurements: Height, weight, and BMI. Blood pressure. Lipid and cholesterol levels. These may be checked every 5 years, or more frequently if you are over 25 years old. Skin check. Lung cancer screening. You may have this screening every year starting at age 31 if you have a 30-pack-year history of smoking and currently smoke or have quit within the past 15 years. Fecal occult blood test (FOBT) of the stool. You may have this test every year starting at age 3. Flexible sigmoidoscopy or colonoscopy. You may have a sigmoidoscopy every 5 years or a colonoscopy every 10 years starting at age 32. Hepatitis C blood test. Hepatitis B blood test. Sexually transmitted disease (STD) testing. Diabetes screening. This is done by checking your blood sugar (glucose) after you have not eaten for a while (fasting). You may have this done every 1-3 years. Bone density scan. This is done to screen for osteoporosis. You may have this done starting at age 57. Mammogram. This may be done every 1-2 years. Talk to your health care provider about how often you should have regular mammograms. Talk with your health care provider about your test results, treatment options, and if necessary, the need for more tests. Vaccines  Your health care provider may recommend certain vaccines, such as: Influenza vaccine. This is recommended every year. Tetanus, diphtheria, and acellular pertussis (Tdap, Td) vaccine. You may need a Td booster every 10 years. Zoster vaccine. You may need this after age 57.  Pneumococcal 13-valent conjugate  (PCV13) vaccine. One dose is recommended after age 66. Pneumococcal polysaccharide (PPSV23) vaccine. One dose is recommended after age 29. Talk to your health care provider about which screenings and vaccines you need and how often you need them. This information is not intended to replace advice given to you by your health care provider. Make sure you discuss any questions you have with your health care provider. Document Released: 02/05/2015 Document Revised: 09/29/2015 Document Reviewed: 11/10/2014 Elsevier Interactive Patient Education  2017 [place] Prevention in the Home Falls can cause injuries. They can happen to people of all ages. There are many things you can do to make your home safe and to help prevent falls. What can I do on the outside of my home? Regularly fix the edges of walkways and driveways and fix any cracks. Remove anything that might make you trip as you walk through a door, such as a raised step or threshold. Trim any bushes or trees on the path to your home. Use bright outdoor lighting. Clear any walking paths of anything that might make someone trip, such as rocks or tools. Regularly check to see if handrails are loose or broken. Make sure that both sides of any steps have handrails. Any raised decks and porches should have guardrails on the edges. Have any leaves, snow, or ice cleared regularly. Use sand or salt on walking paths during winter. Clean up any spills in your garage right away. This includes oil or grease spills. What can I do in the bathroom? Use night lights. Install grab bars by the toilet and in the tub and shower. Do not use towel bars as grab bars. Use non-skid mats or decals in the tub or shower. If you need to sit down in the shower, use a plastic, non-slip stool. Keep the floor dry. Clean up any water that spills on the floor as soon as it happens. Remove soap buildup in the tub or shower regularly. Attach bath mats securely with  double-sided non-slip rug tape. Do not have throw rugs and other things on the floor that can make you trip. What can I do in the bedroom? Use night lights. Make sure that you have a light by your bed that is easy to reach. Do not use any sheets or blankets that are too big for your bed. They should not hang down onto the floor. Have a firm chair that has side arms. You can use this for support while you get dressed. Do not have throw rugs and other things on the floor that can make you trip. What can I do in the kitchen? Clean up any spills right away. Avoid walking on wet floors. Keep items that you use a lot in easy-to-reach places. If you need to reach something above you, use a strong step stool that has a grab bar. Keep electrical cords out of the way. Do not use floor polish or wax that makes floors slippery. If you must use wax, use non-skid floor wax. Do not have throw rugs and other things on the floor that can make you trip. What can I do with my stairs? Do not leave any items on the stairs. Make sure that there are handrails on both sides of the stairs and use them. Fix handrails that are broken or loose. Make sure that handrails are as long as the stairways. Check any carpeting to make sure that it is firmly attached to the stairs. Fix any  carpet that is loose or worn. Avoid having throw rugs at the top or bottom of the stairs. If you do have throw rugs, attach them to the floor with carpet tape. Make sure that you have a light switch at the top of the stairs and the bottom of the stairs. If you do not have them, ask someone to add them for you. What else can I do to help prevent falls? Wear shoes that: Do not have high heels. Have rubber bottoms. Are comfortable and fit you well. Are closed at the toe. Do not wear sandals. If you use a stepladder: Make sure that it is fully opened. Do not climb a closed stepladder. Make sure that both sides of the stepladder are locked  into place. Ask someone to hold it for you, if possible. Clearly mark and make sure that you can see: Any grab bars or handrails. First and last steps. Where the edge of each step is. Use tools that help you move around (mobility aids) if they are needed. These include: Canes. Walkers. Scooters. Crutches. Turn on the lights when you go into a dark area. Replace any light bulbs as soon as they burn out. Set up your furniture so you have a clear path. Avoid moving your furniture around. If any of your floors are uneven, fix them. If there are any pets around you, be aware of where they are. Review your medicines with your doctor. Some medicines can make you feel dizzy. This can increase your chance of falling. Ask your doctor what other things that you can do to help prevent falls. This information is not intended to replace advice given to you by your health care provider. Make sure you discuss any questions you have with your health care provider. Document Released: 11/05/2008 Document Revised: 06/17/2015 Document Reviewed: 02/13/2014 Elsevier Interactive Patient Education  2017 [employer].

## 2021-03-15 NOTE — Progress Notes (Signed)
I connected with Madison Erickson today by telephone and verified that I am speaking with the correct person using two identifiers. Location patient: home Location provider: work Persons participating in the virtual visit: Madison Erickson, Glenna Durand LPN.   I discussed the limitations, risks, security and privacy concerns of performing an evaluation and management service by telephone and the availability of in person appointments. I also discussed with the patient that there may be a patient responsible charge related to this service. The patient expressed understanding and verbally consented to this telephonic visit.    Interactive audio and video telecommunications were attempted between this provider and patient, however failed, due to patient having technical difficulties OR patient did not have access to video capability.  We continued and completed visit with audio only.     Vital signs may be patient reported or missing.  Subjective:   Madison Erickson is a 81 y.o. female who presents for Medicare Annual (Subsequent) preventive examination.  Review of Systems     Cardiac Risk Factors include: advanced age (>69men, >60 women);dyslipidemia;hypertension     Objective:    Today's Vitals   03/15/21 0956  Weight: 149 lb (67.6 kg)  Height: 5\' 2"  (1.575 m)   Body mass index is 27.25 kg/m.  Advanced Directives 03/15/2021 06/11/2020 10/31/2018 10/12/2017 10/12/2017 01/19/2017 03/23/2011  Does Patient Have a Medical Advance Directive? Yes Yes Yes Yes No Yes Patient has advance directive, copy not in chart  Type of Advance Directive [place];Living will - [place];Living will [place];Living will - [place];Living will Living will  Does patient want to make changes to medical advance directive? - No - Patient declined - No - Patient declined - - -  Copy of [place] in Chart? No - copy requested - - No -  copy requested - - -    Current Medications (verified) Outpatient Encounter Medications as of 03/15/2021  Medication Sig   amLODipine (NORVASC) 5 MG tablet Take 1 tablet (5 mg total) by mouth daily.   clobetasol (TEMOVATE) 0.05 % cream Apply 1 application topically as needed.    hydrochlorothiazide (HYDRODIURIL) 25 MG tablet Take 1 tablet (25 mg total) by mouth daily.   metoprolol succinate (TOPROL XL) 50 MG 24 hr tablet TAKE 1 TABLET DAILY WITH OR IMMEDIATELY FOLLOWING A MEAL   METRONIDAZOLE, TOPICAL, 0.75 % LOTN Apply to face once a day as directed   rosuvastatin (CRESTOR) 10 MG tablet TAKE 1 TABLET DAILY   traMADol (ULTRAM) 50 MG tablet Take 1 tablet (50 mg total) by mouth every 6 (six) hours as needed. (Patient not taking: Reported on 03/15/2021)   No facility-administered encounter medications on file as of 03/15/2021.    Allergies (verified) Atorvastatin, Sulfamethoxazole, and Tape   History: Past Medical History:  Diagnosis Date   Abnormal LFTs    fatty liver on [place] MRI faay liver and hemangioma 2008 biopsy 2011   Asthma    allergy   Benign tumor    Bladder polyps    Blood transfusion 1969   Blood transfusion abn reaction or complication, no procedure mishap    Cancer ([place])    squameous cell lsft leg hx   Colon polyp 2009   Tubulovillous adenoma    Diverticulosis of colon    Eczema    Fatty liver disease, nonalcoholic    confirmed biopsy 2011 mild no fibrosis    Hematuria    urethral polyps  History of migraine    none in years   HT (hammer toe) right   Hyperlipidemia    Hypertension    Kidney disease    nephritis as  child. polpys, hematuria   Lesion of bladder    Lichen sclerosus    gyne care topical steroids   Osteoarthritis    Shortness of breath    partial lower rt lung lobectomy.   Status post partial lobectomy of lung    for bronchiesctesis   Urinary incontinence    UTI (lower urinary tract infection)    Wears glasses    Past Surgical History:   Procedure Laterality Date   APPENDECTOMY  8th grade   BREAST BIOPSY Left    2022   caesarean section     X 3   CYSTOSCOPY WITH BIOPSY N/A 06/11/2020   Procedure: CYSTOSCOPY WITH BLADDER BIOPSY AND FULGURATION;  Surgeon: [name], MD;  Location: [facility];  Service: Urology;  Laterality: N/A;   KNEE ARTHROSCOPY  2014   left    left knee surgery on torn mansicus  2011   LUMBAR LAMINECTOMY/DECOMPRESSION MICRODISCECTOMY  03/23/2011   Procedure: LUMBAR LAMINECTOMY/DECOMPRESSION MICRODISCECTOMY 1 LEVEL;  Surgeon: Peggyann Shoals, MD;  Location: [facility];  Service: Neurosurgery;  Laterality: Left;  Left Lumbar four-five laminectomy and microdiscectomy   PERCUTANEOUS LIVER BIOPSY  2011   fatty liver no fibrosis   rt knee surgery  2012   rt lung lobectomy  1969   for bronchiectasis   VAGINAL HYSTERECTOMY  1974   partial   Family History  Problem Relation Age of Onset   Cancer Mother        lung   Heart disease Mother    Diabetes Paternal Grandfather    Cancer Paternal Grandfather        bladder   Mental retardation Other    Breast cancer Neg Hx    Social History   Socioeconomic History   Marital status: Married    Spouse name: Not on file   Number of children: Not on file   Years of education: Not on file   Highest education level: Not on file  Occupational History   Not on file  Tobacco Use   Smoking status: Never    Passive exposure: Never   Smokeless tobacco: Never  Vaping Use   Vaping Use: Never used  Substance and Sexual Activity   Alcohol use: Not Currently    Comment: 4-5 per year    Drug use: No   Sexual activity: Not on file    Comment: hysterectomy  Other Topics Concern   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[employer]   Married   Alcohol occasional social   Household of 2   Caffeine - coffee in am 3 cups   Bereaved parent   Single story home   Bachelors degree    Social Determinants of  Health   Financial Resource Strain: Low Risk    Difficulty of Paying Living Expenses: Not hard at all  Food Insecurity: No Food Insecurity   Worried About Charity fundraiser in the Last Year: Never true   Arboriculturist in the Last Year: Never true  Transportation Needs: No Transportation Needs   Lack of Transportation (Medical): No   Lack of Transportation (Non-Medical): No  Physical Activity: Inactive   Days of Exercise per Week: 0 days   Minutes of  Exercise per Session: 0 min  Stress: No Stress Concern Present   Feeling of Stress : Not at all  Social Connections: Not on file    Tobacco Counseling Counseling given: Not Answered   Clinical Intake:  Pre-visit preparation completed: Yes  Pain : No/denies pain     Nutritional Status: BMI 25 -29 Overweight Nutritional Risks: None Diabetes: No  How often do you need to have someone help you when you read instructions, pamphlets, or other written materials from your doctor or pharmacy?: 1 - Never What is the last grade level you completed in school?: college  Diabetic? no  Interpreter Needed?: No  Information entered by :: NAllen LPN   Activities of Daily Living In your present state of health, do you have any difficulty performing the following activities: 03/15/2021 06/11/2020  Hearing? N N  Vision? N N  Difficulty concentrating or making decisions? N N  Walking or climbing stairs? N N  Dressing or bathing? N N  Doing errands, shopping? N -  Preparing Food and eating ? N -  Using the Toilet? N -  In the past six months, have you accidently leaked urine? Y -  Comment wears poise pad, with cough or sneeze -  Do you have problems with loss of bowel control? N -  Managing your Medications? N -  Managing your Finances? N -  Housekeeping or managing your Housekeeping? N -  Some recent data might be hidden    Patient Care Team: Panosh, Standley Brooking, MD as PCP - General Erline Levine, MD as Attending Physician  (Neurosurgery) Paula Compton, MD as Attending Physician (Obstetrics and Gynecology) Gatha Mayer, MD as Attending Physician (Gastroenterology) Jarome Matin, MD as Consulting Physician (Dermatology) Keene Breath., MD (Ophthalmology) Pieter Partridge, DO as Consulting Physician (Neurology)  Indicate any recent Medical Services you may have received from other than [facility] providers in the past year (date may be approximate).     Assessment:   This is a routine wellness examination for Lyman.  Hearing/Vision screen Vision Screening - Comments:: Regular eye exams, [facility], Dr. Sherral Hammers  Dietary issues and exercise activities discussed: Current Exercise Habits: The patient does not participate in regular exercise at present   Goals Addressed             This Visit's Progress    Patient Stated       03/15/2021, want sto walk the beach again       Depression Screen PHQ 2/9 Scores 03/15/2021 11/21/2019 09/27/2018 08/15/2016 07/07/2015 04/21/2014 04/15/2013  PHQ - 2 Score 0 0 0 0 0 0 0    Fall Risk Fall Risk  03/15/2021 11/22/2020 11/21/2019 10/31/2018 09/27/2018  Falls in the past year? 0 0 0 0 0  Number falls in past yr: - 0 0 - 0  Injury with Fall? - 0 0 - 0  Risk for fall due to : Medication side effect - - - -  Follow up Falls evaluation completed;Education provided;Falls prevention discussed - - - Falls evaluation completed    FALL RISK PREVENTION PERTAINING TO THE HOME:  Any stairs in or around the home? No  If so, are there any without handrails?  N/a Home free of loose throw rugs in walkways, pet beds, electrical cords, etc? Yes  Adequate lighting in your home to reduce risk of falls? Yes   ASSISTIVE DEVICES UTILIZED TO PREVENT FALLS:  Life alert? No  Use of a cane, walker or w/c? No  Grab bars in the bathroom? No  Shower chair or bench in shower? No  Elevated toilet seat or a handicapped toilet? Yes   TIMED UP AND GO:  Was the test performed? No .       Cognitive Function:     6CIT Screen 03/15/2021  What Year? 0 points  What month? 0 points  What time? 0 points  Count back from 20 0 points  Months in reverse 0 points  Repeat phrase 0 points  Total Score 0    Immunizations Immunization History  Administered Date(s) Administered   Fluad Quad(high Dose 65+) 09/27/2018, 11/17/2020   Influenza Split 11/08/2011   Influenza Whole 10/16/2007, 10/19/2008   Influenza, High Dose Seasonal PF 11/05/2013, 11/27/2014, 11/08/2015, 10/17/2017, 10/10/2019   Influenza-Unspecified 10/23/2012, 11/08/2016   PFIZER(Purple Top)SARS-COV-2 Vaccination 02/06/2019, 02/26/2019, 10/10/2019   Pfizer Covid-19 Vaccine Bivalent Booster 47yrs & up 10/18/2020   Pneumococcal Conjugate-13 04/15/2013   Pneumococcal Polysaccharide-23 01/24/1996, 07/07/2015   Td 01/24/1996   Tdap 02/13/2012   Zoster Recombinat (Shingrix) 02/17/2020, 04/21/2020   Zoster, Live 11/20/2007    TDAP status: Up to date  Flu Vaccine status: Up to date  Pneumococcal vaccine status: Up to date  Covid-19 vaccine status: Completed vaccines  Qualifies for Shingles Vaccine? Yes   Zostavax completed Yes   Shingrix Completed?: Yes  Screening Tests Health Maintenance  Topic Date Due   TETANUS/TDAP  02/12/2022   Pneumonia Vaccine 65+ Years old  Completed   INFLUENZA VACCINE  Completed   DEXA SCAN  Completed   COVID-19 Vaccine  Completed   Zoster Vaccines- Shingrix  Completed   HPV VACCINES  Aged Out    Health Maintenance  There are no preventive care reminders to display for this patient.  Colorectal cancer screening: patient states that she is due  Mammogram status: Completed 11/30/2020. Repeat every year  Bone Density status: Completed 04/16/2013.   Lung Cancer Screening: (Low Dose CT Chest recommended if Age 20-80 years, 30 pack-year currently smoking OR have quit w/in 15years.) does not qualify.   Lung Cancer Screening Referral: no  Additional  Screening:  Hepatitis C Screening: does not qualify;   Vision Screening: Recommended annual ophthalmology exams for early detection of glaucoma and other disorders of the eye. Is the patient up to date with their annual eye exam?  Yes  Who is the provider or what is the name of the office in which the patient attends annual eye exams? Dr. Sherral Hammers If pt is not established with a provider, would they like to be referred to a provider to establish care? No .   Dental Screening: Recommended annual dental exams for proper oral hygiene  Community Resource Referral / Chronic Care Management: CRR required this visit?  No   CCM required this visit?  No      Plan:     I have personally reviewed and noted the following in the patients chart:   Medical and social history Use of alcohol, tobacco or illicit drugs  Current medications and supplements including opioid prescriptions.  Functional ability and status Nutritional status Physical activity Advanced directives List of other physicians Hospitalizations, surgeries, and ER visits in previous 12 months Vitals Screenings to include cognitive, depression, and falls Referrals and appointments  In addition, I have reviewed and discussed with patient certain preventive protocols, quality metrics, and best practice recommendations. A written personalized care plan for preventive services as well as general preventive health recommendations were provided to patient.     Pamala Hurry  Alanson Aly, LPN   [id number]/41/[id number]   Nurse Notes: none  Due to this being a virtual visit, the after visit summary with patients personalized plan was offered to patient via mail or my-chart.  Patient would like to access on my-chart

## 2021-03-16 DIAGNOSIS — M67874 Other specified disorders of tendon, left ankle and foot: Secondary | ICD-10-CM | POA: Diagnosis not present

## 2021-04-25 DIAGNOSIS — L900 Lichen sclerosus et atrophicus: Secondary | ICD-10-CM | POA: Diagnosis not present

## 2021-05-19 DIAGNOSIS — D0471 Carcinoma in situ of skin of right lower limb, including hip: Secondary | ICD-10-CM | POA: Diagnosis not present

## 2021-05-19 DIAGNOSIS — L821 Other seborrheic keratosis: Secondary | ICD-10-CM | POA: Diagnosis not present

## 2021-05-19 DIAGNOSIS — Z85828 Personal history of other malignant neoplasm of skin: Secondary | ICD-10-CM | POA: Diagnosis not present

## 2021-05-19 DIAGNOSIS — D485 Neoplasm of uncertain behavior of skin: Secondary | ICD-10-CM | POA: Diagnosis not present

## 2021-07-05 NOTE — Progress Notes (Signed)
Chief Complaint  Patient presents with   Cough    Wants chest x-ray And discolored toenails     HPI: Madison Erickson 81 y.o. come in for a number of concerns  Last visit  with me 10 22  ht   Runny nose  cough  ? If allergy    but just wants to make sure  no sig lung problem  fam hx lung cancer and she has had lung srugery in past .  No other sx of concerns  would like c xray  updated . Was on antihistamine in past and nose spray not recently no cp sob   Toe nails has discoloration great toe area  for past 3 weeks  no sepcific trauma but did have new shoes  no bleeding bruising   R knee  pain progressing over past few months  gets locking at times  no specific injury but does notice if weeding gardening   flares up.  No fever redness . Getting worse and interfering with mobility Wearing a compression sleeve  Bp on hctz toptol crestor amlo   ROS: See pertinent positives and negatives per HPI. No cp sob fall  Past Medical History:  Diagnosis Date   Abnormal LFTs    fatty liver on [place] MRI faay liver and hemangioma 2008 biopsy 2011   Asthma    allergy   Benign tumor    Bladder polyps    Blood transfusion 1969   Blood transfusion abn reaction or complication, no procedure mishap    Cancer ([place])    squameous cell lsft leg hx   Colon polyp 2009   Tubulovillous adenoma    Diverticulosis of colon    Eczema    Fatty liver disease, nonalcoholic    confirmed biopsy 2011 mild no fibrosis    Hematuria    urethral polyps   History of migraine    none in years   HT (hammer toe) right   Hyperlipidemia    Hypertension    Kidney disease    nephritis as  child. polpys, hematuria   Lesion of bladder    Lichen sclerosus    gyne care topical steroids   Osteoarthritis    Shortness of breath    partial lower rt lung lobectomy.   Status post partial lobectomy of lung    for bronchiesctesis   Urinary incontinence    UTI (lower urinary tract infection)    Wears glasses     Family  History  Problem Relation Age of Onset   Cancer Mother        lung   Heart disease Mother    Diabetes Paternal Grandfather    Cancer Paternal Grandfather        bladder   Mental retardation Other    Breast cancer Neg Hx     Social History   Socioeconomic History   Marital status: Married    Spouse name: Not on file   Number of children: Not on file   Years of education: Not on file   Highest education level: Not on file  Occupational History   Not on file  Tobacco Use   Smoking status: Never    Passive exposure: Never   Smokeless tobacco: Never  Vaping Use   Vaping Use: Never used  Substance and Sexual Activity   Alcohol use: Not Currently    Comment: 4-5 per year    Drug use: No   Sexual activity: Not on file  Comment: hysterectomy  Other Topics Concern   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[employer]   Married   Alcohol occasional social   Household of 2   Caffeine - coffee in am 3 cups   Bereaved parent   Single story home   Bachelors degree    Social Determinants of Health   Financial Resource Strain: Low Risk  (03/15/2021)   Overall Financial Resource Strain (CARDIA)    Difficulty of Paying Living Expenses: Not hard at all  Food Insecurity: No Food Insecurity (03/15/2021)   Hunger Vital Sign    Worried About Running Out of Food in the Last Year: Never true    Ran Out of Food in the Last Year: Never true  Transportation Needs: No Transportation Needs (03/15/2021)   PRAPARE - Hydrologist (Medical): No    Lack of Transportation (Non-Medical): No  Physical Activity: Inactive (03/15/2021)   Exercise Vital Sign    Days of Exercise per Week: 0 days    Minutes of Exercise per Session: 0 min  Stress: No Stress Concern Present (03/15/2021)   [place]    Feeling of Stress : Not at all  Social Connections: Not on file     Outpatient Medications Prior to Visit  Medication Sig Dispense Refill   amLODipine (NORVASC) 5 MG tablet Take 1 tablet (5 mg total) by mouth daily. 90 tablet 3   clobetasol (TEMOVATE) 0.05 % cream Apply 1 application topically as needed.      hydrochlorothiazide (HYDRODIURIL) 25 MG tablet Take 1 tablet (25 mg total) by mouth daily. 90 tablet 3   metoprolol succinate (TOPROL XL) 50 MG 24 hr tablet TAKE 1 TABLET DAILY WITH OR IMMEDIATELY FOLLOWING A MEAL 90 tablet 3   METRONIDAZOLE, TOPICAL, 0.75 % LOTN Apply to face once a day as directed 59 mL 2   rosuvastatin (CRESTOR) 10 MG tablet TAKE 1 TABLET DAILY 90 tablet 3   No facility-administered medications prior to visit.     EXAM:  BP 122/76 (BP Location: Left Arm, Patient Position: Sitting, Cuff Size: Normal)   Pulse 77   Temp 99 F (37.2 C) (Oral)   Ht '5\' 2"'$  (1.575 m)   Wt 149 lb 9.6 oz (67.9 kg)   SpO2 98%   BMI 27.36 kg/m   Body mass index is 27.36 kg/m.  GENERAL: vitals reviewed and listed above, alert, oriented, appears well hydrated and in no acute distress HEENT: atraumatic, conjunctiva  clear, no obvious abnormalities on inspection of external nose and ears  NECK: no obvious masses on inspection palpation  LUNGS: clear to auscultation bilaterally, no wheezes, rales or rhonchi, good air movement CV: HRRR, no clubbing cyanosis or  peripheral edema nl cap refill  MS: moves all extremities without noticeable focal  abnormality right knee  no clicking  rom anterior knee tender  gait mild antalgia  Skin  great toenails with bilateral  dark streaks lateral?  No redness  nail base looks clean  right distal  onycholysis  PSYCH: pleasant and cooperative, no obvious depression or anxiety Lab Results  Component Value Date   WBC 10.0 07/06/2021   HGB 14.9 07/06/2021   HCT 43.4 07/06/2021   PLT 262.0 07/06/2021   GLUCOSE 112 (H) 07/06/2021   CHOL 119 07/06/2021   TRIG 155.0 (H) 07/06/2021   HDL 36.20 (L) 07/06/2021    LDLDIRECT  201.9 10/26/2008   LDLCALC 51 07/06/2021   ALT 22 07/06/2021   AST 20 07/06/2021   NA 138 07/06/2021   K 3.0 (L) 07/06/2021   CL 98 07/06/2021   CREATININE 0.63 07/06/2021   BUN 12 07/06/2021   CO2 31 07/06/2021   TSH 1.19 11/22/2020   INR 1.02 10/12/2017   HGBA1C 6.4 07/06/2021   BP Readings from Last 3 Encounters:  07/06/21 122/76  11/22/20 136/70  06/11/20 132/63    ASSESSMENT AND PLAN:  Discussed the following assessment and plan:  Cough, unspecified type - Plan: Basic metabolic panel, Lipid panel, CBC with Differential/Platelet, Hemoglobin A1c, DG Chest 2 View, Hepatic function panel, Basic metabolic panel, Lipid panel, CBC with Differential/Platelet, Hemoglobin A1c, Hepatic function panel  Medication management - update lab monitoring - Plan: Basic metabolic panel, Lipid panel, CBC with Differential/Platelet, Hemoglobin A1c, DG Chest 2 View, Basic metabolic panel, Lipid panel, CBC with Differential/Platelet, Hemoglobin A1c  Fasting hyperglycemia - Plan: Basic metabolic panel, Lipid panel, CBC with Differential/Platelet, Hemoglobin A1c, DG Chest 2 View, Basic metabolic panel, Lipid panel, CBC with Differential/Platelet, Hemoglobin A1c  Hyperlipidemia, unspecified hyperlipidemia type - Plan: Basic metabolic panel, Lipid panel, CBC with Differential/Platelet, Hemoglobin A1c, DG Chest 2 View, Basic metabolic panel, Lipid panel, CBC with Differential/Platelet, Hemoglobin A1c  Essential hypertension - lab monitoring - Plan: Basic metabolic panel, Lipid panel, CBC with Differential/Platelet, Hemoglobin A1c, DG Chest 2 View, Basic metabolic panel, Lipid panel, CBC with Differential/Platelet, Hemoglobin A1c  Nail abnormalities - looks poss trauma bilateralwith ? onycholys, will watch and have derm check in July when due to see them or if worse in interim - Plan: Basic metabolic panel, Lipid panel, CBC with Differential/Platelet, Hemoglobin A1c, DG Chest 2 View, Basic  metabolic panel, Lipid panel, CBC with Differential/Platelet, Hemoglobin A1c  Knee locking, right - Plan: Basic metabolic panel, Lipid panel, CBC with Differential/Platelet, Hemoglobin A1c, DG Chest 2 View, Basic metabolic panel, Lipid panel, CBC with Differential/Platelet, Hemoglobin A1c  -Patient advised to return or notify health care team  if  new concerns arise.  Patient Instructions  You can make appt with [facility]( dr Maureen Ralphs or similar) abou the knee pain and locking  . Agree could be loose body cartilage .  X ray today   try again Flonase otc and or ocass antihistamine claritin if needed.   Labs today .   Check shoes to avoid trauma  If  coloration   persistent or progressive we may have you see dermatology or podiatry .    Standley Brooking. Takeia Ciaravino M.D.

## 2021-07-06 ENCOUNTER — Encounter: Payer: Self-pay | Admitting: Internal Medicine

## 2021-07-06 ENCOUNTER — Ambulatory Visit (INDEPENDENT_AMBULATORY_CARE_PROVIDER_SITE_OTHER): Payer: Medicare Other | Admitting: Internal Medicine

## 2021-07-06 ENCOUNTER — Ambulatory Visit (INDEPENDENT_AMBULATORY_CARE_PROVIDER_SITE_OTHER): Payer: Medicare Other

## 2021-07-06 VITALS — BP 122/76 | HR 77 | Temp 99.0°F | Ht 62.0 in | Wt 149.6 lb

## 2021-07-06 DIAGNOSIS — I10 Essential (primary) hypertension: Secondary | ICD-10-CM | POA: Diagnosis not present

## 2021-07-06 DIAGNOSIS — M2391 Unspecified internal derangement of right knee: Secondary | ICD-10-CM | POA: Diagnosis not present

## 2021-07-06 DIAGNOSIS — E785 Hyperlipidemia, unspecified: Secondary | ICD-10-CM | POA: Diagnosis not present

## 2021-07-06 DIAGNOSIS — R059 Cough, unspecified: Secondary | ICD-10-CM

## 2021-07-06 DIAGNOSIS — Z79899 Other long term (current) drug therapy: Secondary | ICD-10-CM

## 2021-07-06 DIAGNOSIS — R7301 Impaired fasting glucose: Secondary | ICD-10-CM

## 2021-07-06 DIAGNOSIS — L609 Nail disorder, unspecified: Secondary | ICD-10-CM

## 2021-07-06 LAB — HEPATIC FUNCTION PANEL
ALT: 22 U/L (ref 0–35)
AST: 20 U/L (ref 0–37)
Albumin: 4.7 g/dL (ref 3.5–5.2)
Alkaline Phosphatase: 50 U/L (ref 39–117)
Bilirubin, Direct: 0.1 mg/dL (ref 0.0–0.3)
Total Bilirubin: 0.5 mg/dL (ref 0.2–1.2)
Total Protein: 7.2 g/dL (ref 6.0–8.3)

## 2021-07-06 LAB — CBC WITH DIFFERENTIAL/PLATELET
Basophils Absolute: 0.1 10*3/uL (ref 0.0–0.1)
Basophils Relative: 0.8 % (ref 0.0–3.0)
Eosinophils Absolute: 0.2 10*3/uL (ref 0.0–0.7)
Eosinophils Relative: 2.1 % (ref 0.0–5.0)
HCT: 43.4 % (ref 36.0–46.0)
Hemoglobin: 14.9 g/dL (ref 12.0–15.0)
Lymphocytes Relative: 27.5 % (ref 12.0–46.0)
Lymphs Abs: 2.8 10*3/uL (ref 0.7–4.0)
MCHC: 34.3 g/dL (ref 30.0–36.0)
MCV: 85.4 fl (ref 78.0–100.0)
Monocytes Absolute: 0.7 10*3/uL (ref 0.1–1.0)
Monocytes Relative: 6.7 % (ref 3.0–12.0)
Neutro Abs: 6.3 10*3/uL (ref 1.4–7.7)
Neutrophils Relative %: 62.9 % (ref 43.0–77.0)
Platelets: 262 10*3/uL (ref 150.0–400.0)
RBC: 5.07 Mil/uL (ref 3.87–5.11)
RDW: 11.8 % (ref 11.5–15.5)
WBC: 10 10*3/uL (ref 4.0–10.5)

## 2021-07-06 LAB — LIPID PANEL
Cholesterol: 119 mg/dL (ref 0–200)
HDL: 36.2 mg/dL — ABNORMAL LOW (ref >39.00)
LDL Cholesterol: 51 mg/dL (ref 0–99)
NonHDL: 82.3
Total CHOL/HDL Ratio: 3
Triglycerides: 155 mg/dL — ABNORMAL HIGH (ref 0.0–149.0)
VLDL: 31 mg/dL (ref 0.0–40.0)

## 2021-07-06 LAB — BASIC METABOLIC PANEL
BUN: 12 mg/dL (ref 6–23)
CO2: 31 mEq/L (ref 19–32)
Calcium: 10.8 mg/dL — ABNORMAL HIGH (ref 8.4–10.5)
Chloride: 98 mEq/L (ref 96–112)
Creatinine, Ser: 0.63 mg/dL (ref 0.40–1.20)
GFR: 83.62 mL/min (ref >60.00)
Glucose, Bld: 112 mg/dL — ABNORMAL HIGH (ref 70–99)
Potassium: 3 mEq/L — ABNORMAL LOW (ref 3.5–5.1)
Sodium: 138 mEq/L (ref 135–145)

## 2021-07-06 LAB — HEMOGLOBIN A1C: Hgb A1c MFr Bld: 6.4 % (ref 4.6–6.5)

## 2021-07-06 NOTE — Patient Instructions (Signed)
You can make appt with [facility]( dr Maureen Ralphs or similar) abou the knee pain and locking  . Agree could be loose body cartilage .  X ray today   try again Flonase otc and or ocass antihistamine claritin if needed.   Labs today .   Check shoes to avoid trauma  If  coloration   persistent or progressive we may have you see dermatology or podiatry .

## 2021-07-09 ENCOUNTER — Encounter: Payer: Self-pay | Admitting: Internal Medicine

## 2021-07-09 NOTE — Progress Notes (Signed)
Chest x ray  shows no acute or concerning  process.

## 2021-07-15 ENCOUNTER — Telehealth: Payer: Self-pay | Admitting: Internal Medicine

## 2021-07-15 NOTE — Telephone Encounter (Signed)
Pt sees in her mychart that her potassium was low and she needed to start prescription for potassium but has not received any guidance from the office.

## 2021-07-18 ENCOUNTER — Other Ambulatory Visit: Payer: Self-pay

## 2021-07-18 DIAGNOSIS — E876 Hypokalemia: Secondary | ICD-10-CM

## 2021-07-18 MED ORDER — POTASSIUM CHLORIDE CRYS ER 20 MEQ PO TBCR: 20.0000 meq | EXTENDED_RELEASE_TABLET | Freq: Every day | ORAL | 1 refills | Status: DC

## 2021-07-18 NOTE — Telephone Encounter (Addendum)
Pt called to say she called last week and is still waiting for her medication.   She would like it sent to:  [facility]  [address] [place], [place] [address]  Please advise  Please call [phone]

## 2021-08-08 ENCOUNTER — Other Ambulatory Visit (INDEPENDENT_AMBULATORY_CARE_PROVIDER_SITE_OTHER): Payer: Medicare Other

## 2021-08-08 DIAGNOSIS — M7981 Nontraumatic hematoma of soft tissue: Secondary | ICD-10-CM | POA: Diagnosis not present

## 2021-08-08 DIAGNOSIS — L821 Other seborrheic keratosis: Secondary | ICD-10-CM | POA: Diagnosis not present

## 2021-08-08 DIAGNOSIS — D225 Melanocytic nevi of trunk: Secondary | ICD-10-CM | POA: Diagnosis not present

## 2021-08-08 DIAGNOSIS — L570 Actinic keratosis: Secondary | ICD-10-CM | POA: Diagnosis not present

## 2021-08-08 DIAGNOSIS — Z85828 Personal history of other malignant neoplasm of skin: Secondary | ICD-10-CM | POA: Diagnosis not present

## 2021-08-08 DIAGNOSIS — E876 Hypokalemia: Secondary | ICD-10-CM | POA: Diagnosis not present

## 2021-08-08 DIAGNOSIS — L812 Freckles: Secondary | ICD-10-CM | POA: Diagnosis not present

## 2021-08-08 LAB — BASIC METABOLIC PANEL
BUN: 13 mg/dL (ref 6–23)
CO2: 29 mEq/L (ref 19–32)
Calcium: 9.9 mg/dL (ref 8.4–10.5)
Chloride: 103 mEq/L (ref 96–112)
Creatinine, Ser: 0.59 mg/dL (ref 0.40–1.20)
GFR: 84.89 mL/min (ref >60.00)
Glucose, Bld: 137 mg/dL — ABNORMAL HIGH (ref 70–99)
Potassium: 3.5 mEq/L (ref 3.5–5.1)
Sodium: 139 mEq/L (ref 135–145)

## 2021-08-08 LAB — MAGNESIUM: Magnesium: 2.1 mg/dL (ref 1.5–2.5)

## 2021-08-08 NOTE — Addendum Note (Signed)
Addended by: Geradine Girt D on: 08/08/2021 08:58 AM   Modules accepted: Orders

## 2021-08-09 NOTE — Progress Notes (Signed)
Will discuss at upcoming visit tomorrow potassium is improved calcium is in the normal range.

## 2021-08-09 NOTE — Progress Notes (Unsigned)
No chief complaint on file.   HPI: Madison Erickson 81 y.o. come in for  ROS: See pertinent positives and negatives per HPI.  Past Medical History:  Diagnosis Date   Abnormal LFTs    fatty liver on [place] MRI faay liver and hemangioma 2008 biopsy 2011   Asthma    allergy   Benign tumor    Bladder polyps    Blood transfusion 1969   Blood transfusion abn reaction or complication, no procedure mishap    Cancer ([place])    squameous cell lsft leg hx   Colon polyp 2009   Tubulovillous adenoma    Diverticulosis of colon    Eczema    Fatty liver disease, nonalcoholic    confirmed biopsy 2011 mild no fibrosis    Hematuria    urethral polyps   History of migraine    none in years   HT (hammer toe) right   Hyperlipidemia    Hypertension    Kidney disease    nephritis as  child. polpys, hematuria   Lesion of bladder    Lichen sclerosus    gyne care topical steroids   Osteoarthritis    Shortness of breath    partial lower rt lung lobectomy.   Status post partial lobectomy of lung    for bronchiesctesis   Urinary incontinence    UTI (lower urinary tract infection)    Wears glasses     Family History  Problem Relation Age of Onset   Cancer Mother        lung   Heart disease Mother    Diabetes Paternal Grandfather    Cancer Paternal Grandfather        bladder   Mental retardation Other    Breast cancer Neg Hx     Social History   Socioeconomic History   Marital status: Married    Spouse name: Not on file   Number of children: Not on file   Years of education: Not on file   Highest education level: Not on file  Occupational History   Not on file  Tobacco Use   Smoking status: Never    Passive exposure: Never   Smokeless tobacco: Never  Vaping Use   Vaping Use: Never used  Substance and Sexual Activity   Alcohol use: Not Currently    Comment: 4-5 per year    Drug use: No   Sexual activity: Not on file    Comment: hysterectomy  Other Topics Concern   Not on  file  Social History Narrative   Retired Scientist, 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[employer]   Married   Alcohol occasional social   Household of 2   Caffeine - coffee in am 3 cups   Bereaved parent   Single story home   Bachelors degree    Social Determinants of Health   Financial Resource Strain: Low Risk  (03/15/2021)   Overall Financial Resource Strain (CARDIA)    Difficulty of Paying Living Expenses: Not hard at all  Food Insecurity: No Food Insecurity (03/15/2021)   Hunger Vital Sign    Worried About Running Out of Food in the Last Year: Never true    Ran Out of Food in the Last Year: Never true  Transportation Needs: No Transportation Needs (03/15/2021)   PRAPARE - Hydrologist (Medical): No    Lack of Transportation (Non-Medical): No  Physical Activity: Inactive (03/15/2021)   Exercise Vital Sign  Days of Exercise per Week: 0 days    Minutes of Exercise per Session: 0 min  Stress: No Stress Concern Present (03/15/2021)   [place]    Feeling of Stress : Not at all  Social Connections: Not on file    Outpatient Medications Prior to Visit  Medication Sig Dispense Refill   amLODipine (NORVASC) 5 MG tablet Take 1 tablet (5 mg total) by mouth daily. 90 tablet 3   clobetasol (TEMOVATE) 0.05 % cream Apply 1 application topically as needed.      hydrochlorothiazide (HYDRODIURIL) 25 MG tablet Take 1 tablet (25 mg total) by mouth daily. 90 tablet 3   metoprolol succinate (TOPROL XL) 50 MG 24 hr tablet TAKE 1 TABLET DAILY WITH OR IMMEDIATELY FOLLOWING A MEAL 90 tablet 3   METRONIDAZOLE, TOPICAL, 0.75 % LOTN Apply to face once a day as directed 59 mL 2   potassium chloride SA (KLOR-CON M) 20 MEQ tablet Take 1 tablet (20 mEq total) by mouth daily. 30 tablet 1   rosuvastatin (CRESTOR) 10 MG tablet TAKE 1 TABLET DAILY 90 tablet 3   No facility-administered medications prior to visit.      EXAM:  There were no vitals taken for this visit.  There is no height or weight on file to calculate BMI.  GENERAL: vitals reviewed and listed above, alert, oriented, appears well hydrated and in no acute distress HEENT: atraumatic, conjunctiva  clear, no obvious abnormalities on inspection of external nose and ears OP : no lesion edema or exudate  NECK: no obvious masses on inspection palpation  LUNGS: clear to auscultation bilaterally, no wheezes, rales or rhonchi, good air movement CV: HRRR, no clubbing cyanosis or  peripheral edema nl cap refill  MS: moves all extremities without noticeable focal  abnormality PSYCH: pleasant and cooperative, no obvious depression or anxiety Lab Results  Component Value Date   WBC 10.0 07/06/2021   HGB 14.9 07/06/2021   HCT 43.4 07/06/2021   PLT 262.0 07/06/2021   GLUCOSE 137 (H) 08/08/2021   CHOL 119 07/06/2021   TRIG 155.0 (H) 07/06/2021   HDL 36.20 (L) 07/06/2021   LDLDIRECT 201.9 10/26/2008   LDLCALC 51 07/06/2021   ALT 22 07/06/2021   AST 20 07/06/2021   NA 139 08/08/2021   K 3.5 08/08/2021   CL 103 08/08/2021   CREATININE 0.59 08/08/2021   BUN 13 08/08/2021   CO2 29 08/08/2021   TSH 1.19 11/22/2020   INR 1.02 10/12/2017   HGBA1C 6.4 07/06/2021   BP Readings from Last 3 Encounters:  07/06/21 122/76  11/22/20 136/70  06/11/20 132/63    ASSESSMENT AND PLAN:  Discussed the following assessment and plan:  No diagnosis found.  -Patient advised to return or notify health care team  if  new concerns arise.  There are no Patient Instructions on file for this visit.   Standley Brooking. Pearlie Lafosse M.D.

## 2021-08-10 ENCOUNTER — Ambulatory Visit (INDEPENDENT_AMBULATORY_CARE_PROVIDER_SITE_OTHER): Payer: Medicare Other | Admitting: Internal Medicine

## 2021-08-10 ENCOUNTER — Encounter: Payer: Self-pay | Admitting: Internal Medicine

## 2021-08-10 VITALS — BP 130/70 | HR 68 | Temp 98.6°F | Ht 62.0 in | Wt 151.2 lb

## 2021-08-10 DIAGNOSIS — E876 Hypokalemia: Secondary | ICD-10-CM | POA: Diagnosis not present

## 2021-08-10 DIAGNOSIS — H6121 Impacted cerumen, right ear: Secondary | ICD-10-CM | POA: Diagnosis not present

## 2021-08-10 DIAGNOSIS — R7301 Impaired fasting glucose: Secondary | ICD-10-CM

## 2021-08-10 DIAGNOSIS — Z79899 Other long term (current) drug therapy: Secondary | ICD-10-CM | POA: Diagnosis not present

## 2021-08-10 DIAGNOSIS — I10 Essential (primary) hypertension: Secondary | ICD-10-CM | POA: Diagnosis not present

## 2021-08-10 MED ORDER — TRIAMTERENE-HCTZ 37.5-25 MG PO CAPS: 1.0000 | ORAL_CAPSULE | Freq: Every day | ORAL | 1 refills | Status: DC

## 2021-08-10 NOTE — Patient Instructions (Addendum)
Potassium is better .   Lets try a different formulation of diuretic that  without the potassium supplement .  Stop the hctz and potassium and change to dyazide  one a day .     Plan lab in about 3-4 weeks  to see if potassium stays in good range .and go from there.

## 2021-08-25 ENCOUNTER — Encounter: Payer: Self-pay | Admitting: Internal Medicine

## 2021-09-07 ENCOUNTER — Other Ambulatory Visit: Payer: Self-pay

## 2021-09-07 ENCOUNTER — Other Ambulatory Visit (INDEPENDENT_AMBULATORY_CARE_PROVIDER_SITE_OTHER): Payer: Medicare Other

## 2021-09-07 DIAGNOSIS — E876 Hypokalemia: Secondary | ICD-10-CM | POA: Diagnosis not present

## 2021-09-07 DIAGNOSIS — Z79899 Other long term (current) drug therapy: Secondary | ICD-10-CM

## 2021-09-07 DIAGNOSIS — R300 Dysuria: Secondary | ICD-10-CM | POA: Diagnosis not present

## 2021-09-07 DIAGNOSIS — I10 Essential (primary) hypertension: Secondary | ICD-10-CM

## 2021-09-07 LAB — BASIC METABOLIC PANEL
BUN: 18 mg/dL (ref 6–23)
CO2: 28 mEq/L (ref 19–32)
Calcium: 9.8 mg/dL (ref 8.4–10.5)
Chloride: 100 mEq/L (ref 96–112)
Creatinine, Ser: 0.71 mg/dL (ref 0.40–1.20)
GFR: 80.05 mL/min (ref >60.00)
Glucose, Bld: 140 mg/dL — ABNORMAL HIGH (ref 70–99)
Potassium: 3.6 mEq/L (ref 3.5–5.1)
Sodium: 138 mEq/L (ref 135–145)

## 2021-09-07 LAB — URINALYSIS, ROUTINE W REFLEX MICROSCOPIC
Bilirubin Urine: NEGATIVE
Ketones, ur: NEGATIVE
Nitrite: NEGATIVE
Specific Gravity, Urine: 1.01 (ref 1.000–1.030)
Urine Glucose: NEGATIVE
Urobilinogen, UA: 0.2 (ref 0.0–1.0)
pH: 7 (ref 5.0–8.0)

## 2021-09-08 ENCOUNTER — Encounter: Payer: Self-pay | Admitting: Internal Medicine

## 2021-09-08 NOTE — Telephone Encounter (Signed)
Kidney function was stable . Urine was abnormal looks like uti   Was waiting for culture results  but if  you dont think you can wait  for results    Can send in  macrobid 100 mg 1 po bid for 7 days disp 14

## 2021-09-09 LAB — URINE CULTURE
MICRO NUMBER:: 13787589
SPECIMEN QUALITY:: ADEQUATE

## 2021-09-11 NOTE — Progress Notes (Signed)
Urine culture shows  bacteria Klebsiella  Please send in keflex 500 mg 1 po tid  x 5 days  disp 15   Make appt if not better with this rx .

## 2021-09-12 ENCOUNTER — Other Ambulatory Visit: Payer: Self-pay

## 2021-09-12 MED ORDER — CEPHALEXIN 500 MG PO CAPS: 500.0000 mg | ORAL_CAPSULE | Freq: Three times a day (TID) | ORAL | 0 refills | Status: DC

## 2021-09-12 NOTE — Telephone Encounter (Signed)
Potassium is now normal    Stay on current medication since potassium  is now in range .( Dyazide without  potassium supplement ) Document BP readings   Sorry about the confusion. Attention to the uti rx first.

## 2021-09-12 NOTE — Telephone Encounter (Signed)
Keflex sent.   Patient has question on her blood work. She said this is her third time doing a blood work in regards to medication management for her potassium level. Patient is waiting for further instruction.  Please advise.

## 2021-09-23 NOTE — Progress Notes (Signed)
Patient reviewed the lab result through mychart. And rx was resent to [facility] and picked up. Patient request to removed Madison Erickson from her chart.

## 2021-10-14 DIAGNOSIS — Z23 Encounter for immunization: Secondary | ICD-10-CM | POA: Diagnosis not present

## 2021-10-28 ENCOUNTER — Other Ambulatory Visit: Payer: Self-pay | Admitting: Obstetrics and Gynecology

## 2021-10-28 DIAGNOSIS — Z1231 Encounter for screening mammogram for malignant neoplasm of breast: Secondary | ICD-10-CM

## 2021-11-02 ENCOUNTER — Other Ambulatory Visit: Payer: Self-pay | Admitting: Internal Medicine

## 2021-11-03 NOTE — Telephone Encounter (Signed)
Pt is out and she is aware refills can take up to 3 business day

## 2021-12-02 ENCOUNTER — Ambulatory Visit
Admission: RE | Admit: 2021-12-02 | Discharge: 2021-12-02 | Disposition: A | Discharge status: Home / Self Care | Payer: Medicare Other | Source: Ambulatory Visit | Attending: Obstetrics and Gynecology | Admitting: Obstetrics and Gynecology

## 2021-12-02 DIAGNOSIS — Z1231 Encounter for screening mammogram for malignant neoplasm of breast: Secondary | ICD-10-CM | POA: Diagnosis not present

## 2021-12-21 ENCOUNTER — Other Ambulatory Visit: Payer: Self-pay | Admitting: Internal Medicine

## 2021-12-28 ENCOUNTER — Other Ambulatory Visit: Payer: Self-pay

## 2021-12-28 MED ORDER — TRIAMTERENE-HCTZ 37.5-25 MG PO CAPS: 1.0000 | ORAL_CAPSULE | Freq: Every day | ORAL | 2 refills | Status: DC

## 2022-01-15 ENCOUNTER — Other Ambulatory Visit: Payer: Self-pay | Admitting: Internal Medicine

## 2022-01-16 NOTE — Telephone Encounter (Signed)
Ov notes from 08/10/21: Patient Instructions  Potassium is better .    Lets try a different formulation of diuretic that  without the potassium supplement .  Stop the hctz and potassium and change to dyazide  one a day .      Plan lab in about 3-4 weeks  to see if potassium stays in good range .and go from there.

## 2022-02-14 ENCOUNTER — Telehealth: Payer: Self-pay | Admitting: Internal Medicine

## 2022-02-14 DIAGNOSIS — M25561 Pain in right knee: Secondary | ICD-10-CM | POA: Diagnosis not present

## 2022-02-14 NOTE — Telephone Encounter (Signed)
Left message for patient to call back and schedule Medicare Annual Wellness Visit (AWV) either virtually or in office. Left  my Herbie Drape number [phone]   Last AWV 03/15/21 please schedule with Nurse Health Adviser   45 min for awv-i  in office appointments 30 min for awv-s & awv-i phone/virtual appointments

## 2022-03-16 ENCOUNTER — Other Ambulatory Visit: Payer: Self-pay

## 2022-03-16 ENCOUNTER — Encounter: Payer: Self-pay | Admitting: Family Medicine

## 2022-03-16 ENCOUNTER — Telehealth (INDEPENDENT_AMBULATORY_CARE_PROVIDER_SITE_OTHER): Payer: Medicare Other | Admitting: Family Medicine

## 2022-03-16 VITALS — Ht 62.0 in | Wt 140.0 lb

## 2022-03-16 DIAGNOSIS — Z0000 Encounter for general adult medical examination without abnormal findings: Secondary | ICD-10-CM

## 2022-03-16 MED ORDER — METOPROLOL SUCCINATE ER 50 MG PO TB24: ORAL_TABLET | ORAL | 1 refills | Status: DC

## 2022-03-16 MED ORDER — ROSUVASTATIN CALCIUM 10 MG PO TABS: 10.0000 mg | ORAL_TABLET | Freq: Every day | ORAL | 1 refills | Status: DC

## 2022-03-16 MED ORDER — AMLODIPINE BESYLATE 5 MG PO TABS: 5.0000 mg | ORAL_TABLET | Freq: Every day | ORAL | 1 refills | Status: DC

## 2022-03-16 NOTE — Progress Notes (Signed)
PATIENT CHECK-IN and HEALTH RISK ASSESSMENT QUESTIONNAIRE:  -completed by phone/video for upcoming Medicare Preventive Visit  Pre-Visit Check-in: 1)Vitals (height, wt, BP, etc) - record in vitals section for visit on day of visit 2)Review and Update Medications, Allergies PMH, Surgeries, Social history in Epic 3)Hospitalizations in the last year with date/reason? No  4)Review and Update Care Team (patient's specialists) in Epic 5) Complete PHQ9 in Epic  6) Complete Fall Screening in Epic 7)Review all Health Maintenance Due and order under PCP if not done.  8)Medicare Wellness Questionnaire: Answer theses question about your habits: Do you drink alcohol? Occasional Wine If yes, how many drinks do you have a day?1 glass Have you ever smoked?No Quit date if applicable? N/A  How many packs a day do/did you smoke? N/A Do you use smokeless tobacco?N/A Do you use an illicit drugs?N/A Do you exercises? No because of knee problems.IF so, what type and how many days/minutes per week?Plan to start riding the bike soon.  Are you sexually active? Yes Number of partners?yes Eats home cooked foods, eats lots of veggies and fruits Typical breakfast: waffles, toast, boiled eggs Typical lunch: Fruit salad, salads, sandwich Typical dinner: Salmon, Broccoli Typical snacks:Apples with peanut butter, chips  Beverages: Water, unsweet tea, coffee and diet coke  Answer theses question about you: Can you perform most household chores? Yes Do you find it hard to follow a conversation in a noisy room?No Do you often ask people to speak up or repeat themselves?No Do you feel that you have a problem with memory?No Do you balance your checkbook and or bank acounts?Yes Do you feel safe at home?Yes Last dentist visit? 09/2021 Do you need assistance with any of the following: Please note if so No  Driving?  Feeding yourself?  Getting from bed to chair?  Getting to the toilet?  Bathing or  showering?  Dressing yourself?  Managing money?  Climbing a flight of stairs  Preparing meals?  Do you have Advanced Directives in place (Living Will, Healthcare Power or Attorney)? Yes   Last eye Exam and location? 2023- Dr Clydene Laming   Do you currently use prescribed or non-prescribed narcotic or opioid pain medications?No  Do you have a history or close family history of breast, ovarian, tubal or peritoneal cancer or a family member with BRCA (breast cancer susceptibility 1 and 2) gene mutations? No  Nurse/Assistant Credentials/time stamp: Rosealee Albee CMA   ----------------------------------------------------------------------------------------------------------------------------------------------------------------------------------------------------------------------   MEDICARE ANNUAL PREVENTIVE VISIT WITH PROVIDER: (Welcome to Medicare, initial annual wellness or annual wellness exam)  Virtual Visit via Phone Note  I connected with Madison Erickson on 03/16/22 by phone  and verified that I am speaking with the correct person using two identifiers.  Location patient: home Location provider:work or home office Persons participating in the virtual visit: patient, provider  Concerns and/or follow up today: she reports is doing fine. She had to get a shot in her knee recently and is doing well.    See HM section in Epic for other details of completed HM.    ROS: negative for report of fevers, unintentional weight loss, vision changes, vision loss, hearing loss or change, chest pain, sob, hemoptysis, melena, hematochezia, hematuria, genital discharge or lesions, falls, bleeding or bruising, loc, thoughts of suicide or self harm, memory loss  Patient-completed extensive health risk assessment - reviewed and discussed with the patient: See Health Risk Assessment completed with patient prior to the visit either above or in recent phone note. This was reviewed in detailed  with the patient  today and appropriate recommendations, orders and referrals were placed as needed per Summary below and patient instructions.   Review of Medical History: -PMH, PSH, Family History and current specialty and care providers reviewed and updated and listed below   Patient Care Team: Burnis Medin, MD as PCP - General (Internal Medicine) Erline Levine, MD as Attending Physician (Neurosurgery) Paula Compton, MD as Attending Physician (Obstetrics and Gynecology) Gatha Mayer, MD as Attending Physician (Gastroenterology) Jarome Matin, MD as Consulting Physician (Dermatology) Keene Breath., MD (Ophthalmology) Pieter Partridge, DO as Consulting Physician (Neurology)   Past Medical History:  Diagnosis Date   Abnormal LFTs    fatty liver on [place] MRI faay liver and hemangioma 2008 biopsy 2011   Asthma    allergy   Benign tumor    Bladder polyps    Blood transfusion 1969   Blood transfusion abn reaction or complication, no procedure mishap    Cancer ([place])    squameous cell lsft leg hx   Colon polyp 2009   Tubulovillous adenoma    Diverticulosis of colon    Eczema    Fatty liver disease, nonalcoholic    confirmed biopsy 2011 mild no fibrosis    Hematuria    urethral polyps   History of migraine    none in years   HT (hammer toe) right   Hyperlipidemia    Hypertension    Kidney disease    nephritis as  child. polpys, hematuria   Lesion of bladder    Lichen sclerosus    gyne care topical steroids   Osteoarthritis    Shortness of breath    partial lower rt lung lobectomy.   Status post partial lobectomy of lung    for bronchiesctesis   Urinary incontinence    UTI (lower urinary tract infection)    Wears glasses     Past Surgical History:  Procedure Laterality Date   APPENDECTOMY  8th grade   BREAST BIOPSY Left    2022   caesarean section     X 3   CYSTOSCOPY WITH BIOPSY N/A 06/11/2020   Procedure: CYSTOSCOPY WITH BLADDER BIOPSY AND FULGURATION;  Surgeon: [name],  MD;  Location: [facility];  Service: Urology;  Laterality: N/A;   KNEE ARTHROSCOPY  2014   left    left knee surgery on torn mansicus  2011   LUMBAR LAMINECTOMY/DECOMPRESSION MICRODISCECTOMY  03/23/2011   Procedure: LUMBAR LAMINECTOMY/DECOMPRESSION MICRODISCECTOMY 1 LEVEL;  Surgeon: Peggyann Shoals, MD;  Location: [facility];  Service: Neurosurgery;  Laterality: Left;  Left Lumbar four-five laminectomy and microdiscectomy   PERCUTANEOUS LIVER BIOPSY  2011   fatty liver no fibrosis   rt knee surgery  2012   rt lung lobectomy  1969   for bronchiectasis   VAGINAL HYSTERECTOMY  1974   partial    Social History   Socioeconomic History   Marital status: Married    Spouse name: Not on file   Number of children: Not on file   Years of education: Not on file   Highest education level: Not on file  Occupational History   Not on file  Tobacco Use   Smoking status: Never    Passive exposure: Never   Smokeless tobacco: Never  Vaping Use   Vaping Use: Never used  Substance and Sexual Activity   Alcohol use: Not Currently    Comment: 4-5 per year    Drug use: No   Sexual activity:  Not on file    Comment: hysterectomy  Other Topics Concern   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[employer]   Married   Alcohol occasional social   Household of 2   Caffeine - coffee in am 3 cups   Bereaved parent   Single story home   Bachelors degree    Social Determinants of Health   Financial Resource Strain: Low Risk  (03/15/2021)   Overall Financial Resource Strain (CARDIA)    Difficulty of Paying Living Expenses: Not hard at all  Food Insecurity: No Food Insecurity (03/15/2021)   Hunger Vital Sign    Worried About Running Out of Food in the Last Year: Never true    Ran Out of Food in the Last Year: Never true  Transportation Needs: No Transportation Needs (03/15/2021)   PRAPARE - Hydrologist (Medical): No     Lack of Transportation (Non-Medical): No  Physical Activity: Inactive (03/15/2021)   Exercise Vital Sign    Days of Exercise per Week: 0 days    Minutes of Exercise per Session: 0 min  Stress: No Stress Concern Present (03/15/2021)   [place]    Feeling of Stress : Not at all  Social Connections: Not on file  Intimate Partner Violence: Not on file    Family History  Problem Relation Age of Onset   Cancer Mother        lung   Heart disease Mother    Diabetes Paternal Grandfather    Cancer Paternal Grandfather        bladder   Mental retardation Other    Breast cancer Neg Hx     Current Outpatient Medications on File Prior to Visit  Medication Sig Dispense Refill   amLODipine (NORVASC) 5 MG tablet TAKE 1 TABLET DAILY 90 tablet 0   clobetasol (TEMOVATE) 0.05 % cream Apply 1 application topically as needed.      metoprolol succinate (TOPROL-XL) 50 MG 24 hr tablet TAKE 1 TABLET DAILY WITH OR IMMEDIATELY FOLLOWING MEAL 90 tablet 0   METRONIDAZOLE, TOPICAL, 0.75 % LOTN Apply to face once a day as directed 59 mL 2   rosuvastatin (CRESTOR) 10 MG tablet TAKE 1 TABLET DAILY 90 tablet 0   triamterene-hydrochlorothiazide (DYAZIDE) 37.5-25 MG capsule TAKE 1 CAPSULE BY MOUTH DAILY 30 capsule 2   No current facility-administered medications on file prior to visit.    Allergies  Allergen Reactions   Atorvastatin Other (See Comments)    Muscle aches with lipitor   Sulfamethoxazole Other (See Comments)    Could never take this because it negatively affected her "white cells and kidneys" (had Bright's Disese)   Tape Other (See Comments)    Can tolerate paper tape; is sensitive       Physical Exam There were no vitals filed for this visit. Estimated body mass index is 25.61 kg/m as calculated from the following:   Height as of this encounter: 5' 2"$  (1.575 m).   Weight as of this encounter: 140 lb (63.5 kg).  EKG  (optional): deferred due to virtual visit  GENERAL: alert, oriented, no acute distress detected, full vision exam deferred due to pandemic and/or virtual encounter  PSYCH/NEURO: pleasant and cooperative, no obvious depression or anxiety, speech and thought processing grossly intact, Cognitive function grossly intact  [employer] Office Visit from 08/10/2021 in [facility] at  [place]  PHQ-9 Total Score 0           08/10/2021   10:28 AM 07/06/2021   12:24 PM 03/15/2021   10:04 AM 11/21/2019    9:22 AM 09/27/2018    2:06 PM  Depression screen PHQ 2/9  Decreased Interest 0 0 0 0 0  Down, Depressed, Hopeless 0 0 0 0 0  PHQ - 2 Score 0 0 0 0 0  Altered sleeping 0 0     Tired, decreased energy 0 1     Change in appetite 0 0     Feeling bad or failure about yourself  0 0     Trouble concentrating 0 0     Moving slowly or fidgety/restless 0 0     Suicidal thoughts 0 0     PHQ-9 Score 0 1     Difficult doing work/chores Not difficult at all Not difficult at all          11/22/2020    3:08 PM 03/15/2021   10:04 AM 07/06/2021   12:23 PM 08/10/2021   10:28 AM 03/16/2022   11:04 AM  Fall Risk  Falls in the past year? 0 0 0 0 0  Was there an injury with Fall? 0  0 0 0  Fall Risk Category Calculator 0  0 0 0  Fall Risk Category (Retired) Low  Low Low   (RETIRED) Patient Fall Risk Level  Low fall risk Low fall risk Low fall risk   Patient at Risk for Falls Due to  Medication side effect No Fall Risks No Fall Risks No Fall Risks  Fall risk Follow up  Falls evaluation completed;Education provided;Falls prevention discussed Falls prevention discussed Falls prevention discussed Falls evaluation completed     SUMMARY AND PLAN:  Encounter for Medicare annual wellness exam   Discussed applicable health maintenance/preventive health measures and advised and referred or ordered per patient preferences:  Health Maintenance  Topic Date Due   COVID-19 Vaccine (6 -  2023-24 season) 03/23/2023 (Originally 12/09/2021)   Medicare Annual Wellness ([place])  03/17/2023   Pneumonia Vaccine 62+ Years old  Completed   INFLUENZA VACCINE  Completed   DEXA SCAN  Completed   Zoster Vaccines- Shingrix  Completed   HPV VACCINES  Aged Out   DTaP/Tdap/Td  Discontinued  We also discussed her bone density. She prefers not to do and rather to work on balance exercises, exercise, diet and take cal/vit d. Advised if she changes her mind to let PCP office know so can order.   Education and counseling on the following was provided based on the above review of health and a plan/checklist for the patient, along with additional information discussed, was provided for the patient in the patient instructions :    -Advised and counseled on maintaining healthy weight and healthy lifestyle - including the importance of a healthy diet, regular physical activity, social connections and stress management. -Advised and counseled on a whole foods based healthy diet and regular exercise: discussed a heart healthy whole foods based diet at length. A summary of a healthy diet was provided in the Patient Instructions. Recommended regular exercise and discussed options within the community.  -Advised yearly dental visits at minimum and regular eye exams -Advised and counseled on alcohol, tobacco, drug, opoid use/misuse if applicable and options for help if applicable.  Follow up: see patient instructions     Patient Instructions  I really enjoyed getting to talk with you today! I am  available on Tuesdays and Thursdays for virtual visits if you have any questions or concerns, or if I can be of any further assistance.   CHECKLIST FROM ANNUAL WELLNESS VISIT:  -Follow up (please call to schedule if not scheduled after visit):  -Inperson visit with your Primary Doctor office: schedule and in office follow up with your doctor - it -yearly for annual wellness visit with primary care office  Here  is a list of your preventive care/health maintenance measures and the plan for each if any are due:  Health Maintenance  Topic Date Due   COVID-19 Vaccine (6 - 2023-24 season) 03/23/2023 (Originally 12/09/2021)   Medicare Annual Wellness ([place])  03/17/2023   Pneumonia Vaccine 41+ Years old  Completed   INFLUENZA VACCINE  Completed   DEXA SCAN  Completed   Zoster Vaccines- Shingrix  Completed   HPV VACCINES  Aged Out   DTaP/Tdap/Td  Discontinued    -See a dentist at least yearly  -Get your eyes checked and then per your eye specialist's recommendations  -Other issues addressed today:  -I have included below further information regarding a healthy whole foods based diet, physical activity guidelines for adults, stress management and opportunities for social connections. I hope you find this information useful.   -----------------------------------------------------------------------------------------------------------------------------------------------------------------------------------------------------------------------------------------------------------  NUTRITION: -eat real food: lots of colorful vegetables (half the plate) and fruits -5-7 servings of vegetables and fruits per day (fresh or steamed is best), exp. 2 servings of vegetables with lunch and dinner and 2 servings of fruit per day. Berries and greens such as kale and collards are great choices.  -consume on a regular basis: whole grains (make sure first ingredient on label contains the word "whole"), fresh fruits, fish, nuts, seeds, healthy oils (such as olive oil, avocado oil, grape seed oil) -may eat small amounts of dairy and lean meat on occasion, but avoid processed meats such as ham, bacon, lunch meat, etc. -drink water -try to avoid fast food and pre-packaged foods, processed meat -most experts advise limiting sodium to < 2377m per day, should limit further is any chronic conditions such as high blood pressure,  heart disease, diabetes, etc. The American Heart Association advised that < 15079mis is ideal -try to avoid foods that contain any ingredients with names you do not recognize  -try to avoid sugar/sweets (except for the natural sugar that occurs in fresh fruit) -try to avoid sweet drinks -try to avoid white rice, white bread, pasta (unless whole grain), white or yellow potatoes  EXERCISE GUIDELINES FOR ADULTS: -if you wish to increase your physical activity, do so gradually and with the approval of your doctor -STOP and seek medical care immediately if you have any chest pain, chest discomfort or trouble breathing when starting or increasing exercise  -move and stretch your body, legs, feet and arms when sitting for long periods -Physical activity guidelines for optimal health in adults: -least 150 minutes per week of aerobic exercise (can talk, but not sing) once approved by your doctor, 20-30 minutes of sustained activity or two 10 minute episodes of sustained activity every day.  -resistance training at least 2 days per week if approved by your doctor -balance exercises 3+ days per week:   Stand somewhere where you have something sturdy to hold onto if you lose balance.    1) lift up on toes, start with 5x per day and work up to 20x   2) stand and lift on leg straight out to the side so that foot  is a few inches of the floor, start with 5x each side and work up to 20x each side   3) stand on one foot, start with 5 seconds each side and work up to 20 seconds on each side  If you need ideas or help with getting more active:  -Silver sneakers https://tools.silversneakers.com  -Walk with a Doc: http://stephens-thompson.biz/  -try to include resistance (weight lifting/strength building) and balance exercises twice per week: or the following link for  ideas: ChessContest.fr  UpdateClothing.com.cy  STRESS MANAGEMENT: -can try meditating, or just sitting quietly with deep breathing while intentionally relaxing all parts of your body for 5 minutes daily -if you need further help with stress, anxiety or depression please follow up with your primary doctor or contact the wonderful folks at [facility]: [place]: -options in [place] if you wish to engage in more social and exercise related activities:  -Silver sneakers https://tools.silversneakers.com  -Walk with a Doc: http://stephens-thompson.biz/  -Check out the [place] 50+ section on the [place] [employer] (hiking clubs, book clubs, cards and games, chess, exercise classes, aquatic classes and much more) - see the website for details: https://www.[place].gov/departments/parks-recreation/active-adults50  -YouTube has lots of exercise videos for different ages and abilities as well  -[place] (a variety of indoor and outdoor inperson activities for adults). [phone]. [address]  -Virtual Online Classes (a variety of topics): see seniorplanet.org or call [phone]  -consider volunteering at a school, hospice center, church, senior center or elsewhere           Lucretia Kern, DO

## 2022-03-16 NOTE — Patient Instructions (Addendum)
I really enjoyed getting to talk with you today! I am available on Tuesdays and Thursdays for virtual visits if you have any questions or concerns, or if I can be of any further assistance.   CHECKLIST FROM ANNUAL WELLNESS VISIT:  -Follow up (please call to schedule if not scheduled after visit):  -Inperson visit with your Primary Doctor office: schedule and in office follow up with your doctor - it -yearly for annual wellness visit with primary care office  Here is a list of your preventive care/health maintenance measures and the plan for each if any are due:  Health Maintenance  Topic Date Due   COVID-19 Vaccine (6 - 2023-24 season) 03/23/2023 (Originally 12/09/2021)   Medicare Annual Wellness ([place])  03/17/2023   Pneumonia Vaccine 29+ Years old  Completed   INFLUENZA VACCINE  Completed   DEXA SCAN  Completed   Zoster Vaccines- Shingrix  Completed   HPV VACCINES  Aged Out   DTaP/Tdap/Td  Discontinued    -See a dentist at least yearly  -Get your eyes checked and then per your eye specialist's recommendations  -Other issues addressed today:  -I have included below further information regarding a healthy whole foods based diet, physical activity guidelines for adults, stress management and opportunities for social connections. I hope you find this information useful.   -----------------------------------------------------------------------------------------------------------------------------------------------------------------------------------------------------------------------------------------------------------  NUTRITION: -eat real food: lots of colorful vegetables (half the plate) and fruits -5-7 servings of vegetables and fruits per day (fresh or steamed is best), exp. 2 servings of vegetables with lunch and dinner and 2 servings of fruit per day. Berries and greens such as kale and collards are great choices.  -consume on a regular basis: whole grains (make sure first  ingredient on label contains the word "whole"), fresh fruits, fish, nuts, seeds, healthy oils (such as olive oil, avocado oil, grape seed oil) -may eat small amounts of dairy and lean meat on occasion, but avoid processed meats such as ham, bacon, lunch meat, etc. -drink water -try to avoid fast food and pre-packaged foods, processed meat -most experts advise limiting sodium to < 2333m per day, should limit further is any chronic conditions such as high blood pressure, heart disease, diabetes, etc. The American Heart Association advised that < 15063mis is ideal -try to avoid foods that contain any ingredients with names you do not recognize  -try to avoid sugar/sweets (except for the natural sugar that occurs in fresh fruit) -try to avoid sweet drinks -try to avoid white rice, white bread, pasta (unless whole grain), white or yellow potatoes  EXERCISE GUIDELINES FOR ADULTS: -if you wish to increase your physical activity, do so gradually and with the approval of your doctor -STOP and seek medical care immediately if you have any chest pain, chest discomfort or trouble breathing when starting or increasing exercise  -move and stretch your body, legs, feet and arms when sitting for long periods -Physical activity guidelines for optimal health in adults: -least 150 minutes per week of aerobic exercise (can talk, but not sing) once approved by your doctor, 20-30 minutes of sustained activity or two 10 minute episodes of sustained activity every day.  -resistance training at least 2 days per week if approved by your doctor -balance exercises 3+ days per week:   Stand somewhere where you have something sturdy to hold onto if you lose balance.    1) lift up on toes, start with 5x per day and work up to 20x   2) stand and lift  on leg straight out to the side so that foot is a few inches of the floor, start with 5x each side and work up to 20x each side   3) stand on one foot, start with 5 seconds each  side and work up to 20 seconds on each side  If you need ideas or help with getting more active:  -Silver sneakers https://tools.silversneakers.com  -Walk with a Doc: http://stephens-thompson.biz/  -try to include resistance (weight lifting/strength building) and balance exercises twice per week: or the following link for ideas: ChessContest.fr  UpdateClothing.com.cy  STRESS MANAGEMENT: -can try meditating, or just sitting quietly with deep breathing while intentionally relaxing all parts of your body for 5 minutes daily -if you need further help with stress, anxiety or depression please follow up with your primary doctor or contact the wonderful folks at [facility]: [place]: -options in [place] if you wish to engage in more social and exercise related activities:  -Silver sneakers https://tools.silversneakers.com  -Walk with a Doc: http://stephens-thompson.biz/  -Check out the [place] 50+ section on the [place] [employer] (hiking clubs, book clubs, cards and games, chess, exercise classes, aquatic classes and much more) - see the website for details: https://www.[place].gov/departments/parks-recreation/active-adults50  -YouTube has lots of exercise videos for different ages and abilities as well  -[place] (a variety of indoor and outdoor inperson activities for adults). [phone]. [address]  -Virtual Online Classes (a variety of topics): see seniorplanet.org or call [phone]  -consider volunteering at a school, hospice center, church, senior center or elsewhere

## 2022-05-18 ENCOUNTER — Other Ambulatory Visit: Payer: Self-pay | Admitting: Internal Medicine

## 2022-09-04 ENCOUNTER — Other Ambulatory Visit: Payer: Self-pay | Admitting: Internal Medicine

## 2022-09-05 DIAGNOSIS — L218 Other seborrheic dermatitis: Secondary | ICD-10-CM | POA: Diagnosis not present

## 2022-09-05 DIAGNOSIS — L812 Freckles: Secondary | ICD-10-CM | POA: Diagnosis not present

## 2022-09-05 DIAGNOSIS — D225 Melanocytic nevi of trunk: Secondary | ICD-10-CM | POA: Diagnosis not present

## 2022-09-05 DIAGNOSIS — Z85828 Personal history of other malignant neoplasm of skin: Secondary | ICD-10-CM | POA: Diagnosis not present

## 2022-09-05 DIAGNOSIS — L820 Inflamed seborrheic keratosis: Secondary | ICD-10-CM | POA: Diagnosis not present

## 2022-09-05 DIAGNOSIS — L821 Other seborrheic keratosis: Secondary | ICD-10-CM | POA: Diagnosis not present

## 2022-09-05 DIAGNOSIS — D1801 Hemangioma of skin and subcutaneous tissue: Secondary | ICD-10-CM | POA: Diagnosis not present

## 2022-09-05 DIAGNOSIS — L718 Other rosacea: Secondary | ICD-10-CM | POA: Diagnosis not present

## 2022-09-05 DIAGNOSIS — D485 Neoplasm of uncertain behavior of skin: Secondary | ICD-10-CM | POA: Diagnosis not present

## 2022-09-05 DIAGNOSIS — L570 Actinic keratosis: Secondary | ICD-10-CM | POA: Diagnosis not present

## 2022-10-06 ENCOUNTER — Other Ambulatory Visit: Payer: Self-pay | Admitting: Internal Medicine

## 2022-10-20 ENCOUNTER — Other Ambulatory Visit: Payer: Self-pay | Admitting: Obstetrics and Gynecology

## 2022-10-20 DIAGNOSIS — Z1231 Encounter for screening mammogram for malignant neoplasm of breast: Secondary | ICD-10-CM

## 2022-11-13 ENCOUNTER — Encounter: Payer: Self-pay | Admitting: Internal Medicine

## 2022-11-13 DIAGNOSIS — Z23 Encounter for immunization: Secondary | ICD-10-CM | POA: Diagnosis not present

## 2022-11-21 NOTE — Progress Notes (Unsigned)
No chief complaint on file.   HPI: Patient  Madison Erickson  82 y.o. comes in today for yearly visit   Health Maintenance  Topic Date Due   COVID-19 Vaccine (7 - 2023-24 season) 01/08/2023   Medicare Annual Wellness (AWV)  03/17/2023   Pneumonia Vaccine 36+ Years old  Completed   INFLUENZA VACCINE  Completed   DEXA SCAN  Completed   Zoster Vaccines- Shingrix  Completed   HPV VACCINES  Aged Out   DTaP/Tdap/Td  Discontinued   Hepatitis C Screening  Discontinued   Health Maintenance Review LIFESTYLE:  Exercise:   Tobacco/ETS: Alcohol:  Sugar beverages: Sleep: Drug use: no HH of  Work: [employer]    ROS:  GEN/ HEENT: No fever, significant weight changes sweats headaches vision problems hearing changes, CV/ PULM; No chest pain shortness of breath cough, syncope,edema  change in exercise tolerance. GI /GU: No adominal pain, vomiting, change in bowel habits. No blood in the stool. No significant GU symptoms. SKIN/HEME: ,no acute skin rashes suspicious lesions or bleeding. No lymphadenopathy, nodules, masses.  NEURO/ PSYCH:  No neurologic signs such as weakness numbness. No depression anxiety. IMM/ Allergy: No unusual infections.  Allergy .   REST of 12 system review negative except as per HPI   Past Medical History:  Diagnosis Date   Abnormal LFTs    fatty liver on [place] MRI faay liver and hemangioma 2008 biopsy 2011   Asthma    allergy   Benign tumor    Bladder polyps    Blood transfusion 1969   Blood transfusion abn reaction or complication, no procedure mishap    Cancer (HCC)    squameous cell lsft leg hx   Colon polyp 2009   Tubulovillous adenoma    Diverticulosis of colon    Eczema    Fatty liver disease, nonalcoholic    confirmed biopsy 2011 mild no fibrosis    Hematuria    urethral polyps   History of migraine    none in years   HT (hammer toe) right   Hyperlipidemia    Hypertension    Kidney disease    nephritis as  child. polpys, hematuria    Lesion of bladder    Lichen sclerosus    gyne care topical steroids   Osteoarthritis    Shortness of breath    partial lower rt lung lobectomy.   Status post partial lobectomy of lung    for bronchiesctesis   Urinary incontinence    UTI (lower urinary tract infection)    Wears glasses     Past Surgical History:  Procedure Laterality Date   APPENDECTOMY  8th grade   BREAST BIOPSY Left    2022   caesarean section     X 3   CYSTOSCOPY WITH BIOPSY N/A 06/11/2020   Procedure: CYSTOSCOPY WITH BLADDER BIOPSY AND FULGURATION;  Surgeon: Bjorn Pippin, MD;  Location: [facility];  Service: Urology;  Laterality: N/A;   KNEE ARTHROSCOPY  2014   left    left knee surgery on torn mansicus  2011   LUMBAR LAMINECTOMY/DECOMPRESSION MICRODISCECTOMY  03/23/2011   Procedure: LUMBAR LAMINECTOMY/DECOMPRESSION MICRODISCECTOMY 1 LEVEL;  Surgeon: Dorian Heckle, MD;  Location: [facility];  Service: Neurosurgery;  Laterality: Left;  Left Lumbar four-five laminectomy and microdiscectomy   PERCUTANEOUS LIVER BIOPSY  2011   fatty liver no fibrosis   rt knee surgery  2012   rt lung lobectomy  1969   for bronchiectasis  VAGINAL HYSTERECTOMY  1974   partial    Family History  Problem Relation Age of Onset   Cancer Mother        lung   Heart disease Mother    Diabetes Paternal Grandfather    Cancer Paternal Grandfather        bladder   Mental retardation Other    Breast cancer Neg Hx     Social History   Socioeconomic History   Marital status: Married    Spouse name: Not on file   Number of children: Not on file   Years of education: Not on file   Highest education level: Not on file  Occupational History   Not on file  Tobacco Use   Smoking status: Never    Passive exposure: Never   Smokeless tobacco: Never  Vaping Use   Vaping status: Never Used  Substance and Sexual Activity   Alcohol use: Not Currently    Comment: 4-5 per year    Drug use: No   Sexual activity:  Not on file    Comment: hysterectomy  Other Topics Concern   Not on file  Social History Narrative   Retired Audiological scientist estate travels a lot to [place]   Married   Alcohol occasional social   Household of 2   Caffeine - coffee in am 3 cups   Bereaved parent   Single story home   Bachelors degree    Social Determinants of Health   Financial Resource Strain: Low Risk  (03/15/2021)   Overall Financial Resource Strain (CARDIA)    Difficulty of Paying Living Expenses: Not hard at all  Food Insecurity: No Food Insecurity (03/15/2021)   Hunger Vital Sign    Worried About Running Out of Food in the Last Year: Never true    Ran Out of Food in the Last Year: Never true  Transportation Needs: No Transportation Needs (03/15/2021)   PRAPARE - Administrator, Civil Service (Medical): No    Lack of Transportation (Non-Medical): No  Physical Activity: Inactive (03/15/2021)   Exercise Vital Sign    Days of Exercise per Week: 0 days    Minutes of Exercise per Session: 0 min  Stress: No Stress Concern Present (03/15/2021)   [employer] of Occupational Health - Occupational Stress Questionnaire    Feeling of Stress : Not at all  Social Connections: Not on file    Outpatient Medications Prior to Visit  Medication Sig Dispense Refill   amLODipine (NORVASC) 5 MG tablet TAKE 1 TABLET DAILY 90 tablet 3   clobetasol (TEMOVATE) 0.05 % cream Apply 1 application topically as needed.      metoprolol succinate (TOPROL-XL) 50 MG 24 hr tablet TAKE 1 TABLET DAILY WITH OR IMMEDIATELY FOLLOWING A MEAL 90 tablet 3   METRONIDAZOLE, TOPICAL, 0.75 % LOTN Apply to face once a day as directed 59 mL 2   rosuvastatin (CRESTOR) 10 MG tablet TAKE 1 TABLET DAILY 90 tablet 3   triamterene-hydrochlorothiazide (DYAZIDE) 37.5-25 MG capsule TAKE 1 CAPSULE BY MOUTH DAILY 30 capsule 2   No facility-administered medications prior to visit.     EXAM:  There were no vitals taken for this visit.  There is no  height or weight on file to calculate BMI. Wt Readings from Last 3 Encounters:  03/16/22 140 lb (63.5 kg)  08/10/21 151 lb 3.2 oz (68.6 kg)  07/06/21 149 lb 9.6 oz (67.9 kg)    Physical Exam: Vital signs reviewed TFT:DDUK is  a well-developed well-nourished alert cooperative    who appearsr stated age in no acute distress.  HEENT: normocephalic atraumatic , Eyes: PERRL EOM's full, conjunctiva clear, Nares: paten,t no deformity discharge or tenderness., Ears: no deformity EAC's clear TMs with normal landmarks. Mouth: clear OP, no lesions, edema.  Moist mucous membranes. Dentition in adequate repair. NECK: supple without masses, thyromegaly or bruits. CHEST/PULM:  Clear to auscultation and percussion breath sounds equal no wheeze , rales or rhonchi. No chest wall deformities or tenderness. Breast: normal by inspection . No dimpling, discharge, masses, tenderness or discharge . CV: PMI is nondisplaced, S1 S2 no gallops, murmurs, rubs. Peripheral pulses are full without delay.No JVD .  ABDOMEN: Bowel sounds normal nontender  No guard or rebound, no hepato splenomegal no CVA tenderness.  No hernia. Extremtities:  No clubbing cyanosis or edema, no acute joint swelling or redness no focal atrophy NEURO:  Oriented x3, cranial nerves 3-12 appear to be intact, no obvious focal weakness,gait within normal limits no abnormal reflexes or asymmetrical SKIN: No acute rashes normal turgor, color, no bruising or petechiae. PSYCH: Oriented, good eye contact, no obvious depression anxiety, cognition and judgment appear normal. LN: no cervical axillary inguinal adenopathy  Lab Results  Component Value Date   WBC 10.0 07/06/2021   HGB 14.9 07/06/2021   HCT 43.4 07/06/2021   PLT 262.0 07/06/2021   GLUCOSE 140 (H) 09/07/2021   CHOL 119 07/06/2021   TRIG 155.0 (H) 07/06/2021   HDL 36.20 (L) 07/06/2021   LDLDIRECT 201.9 10/26/2008   LDLCALC 51 07/06/2021   ALT 22 07/06/2021   AST 20 07/06/2021   NA 138  09/07/2021   K 3.6 09/07/2021   CL 100 09/07/2021   CREATININE 0.71 09/07/2021   BUN 18 09/07/2021   CO2 28 09/07/2021   TSH 1.19 11/22/2020   INR 1.02 10/12/2017   HGBA1C 6.4 07/06/2021    BP Readings from Last 3 Encounters:  08/10/21 130/70  07/06/21 122/76  11/22/20 136/70    Lab update reviewed with patient   ASSESSMENT AND PLAN:  Discussed the following assessment and plan:    ICD-10-CM   1. Medication management  Z79.899     2. Essential hypertension  I10     3. Fasting hyperglycemia  R73.01     4. Hyperlipidemia, unspecified hyperlipidemia type  E78.5     5. History of recurrent UTIs  Z87.440     6. Fatty liver disease, nonalcoholic  K76.0     Hx of tia  No follow-ups on file.  Patient Care Team: Madelin Headings, MD as PCP - General (Internal Medicine) Maeola Harman, MD as Attending Physician (Neurosurgery) Huel Cote, MD as Attending Physician (Obstetrics and Gynecology) Iva Boop, MD as Attending Physician (Gastroenterology) Donzetta Starch, MD as Consulting Physician (Dermatology) Marzella Schlein., MD (Ophthalmology) Drema Dallas, DO as Consulting Physician (Neurology) There are no Patient Instructions on file for this visit.  Neta Mends. Rayhan Groleau M.D.

## 2022-11-22 ENCOUNTER — Ambulatory Visit (INDEPENDENT_AMBULATORY_CARE_PROVIDER_SITE_OTHER): Payer: Medicare Other | Admitting: Internal Medicine

## 2022-11-22 ENCOUNTER — Encounter: Payer: Self-pay | Admitting: Internal Medicine

## 2022-11-22 ENCOUNTER — Ambulatory Visit (INDEPENDENT_AMBULATORY_CARE_PROVIDER_SITE_OTHER)
Admission: RE | Admit: 2022-11-22 | Discharge: 2022-11-22 | Disposition: A | Discharge status: Home / Self Care | Payer: Medicare Other | Source: Ambulatory Visit | Attending: Internal Medicine | Admitting: Internal Medicine

## 2022-11-22 VITALS — BP 128/80 | HR 75 | Temp 98.2°F | Ht 62.8 in | Wt 144.4 lb

## 2022-11-22 DIAGNOSIS — R7301 Impaired fasting glucose: Secondary | ICD-10-CM

## 2022-11-22 DIAGNOSIS — Z87440 Personal history of urinary (tract) infections: Secondary | ICD-10-CM | POA: Diagnosis not present

## 2022-11-22 DIAGNOSIS — R9389 Abnormal findings on diagnostic imaging of other specified body structures: Secondary | ICD-10-CM | POA: Diagnosis not present

## 2022-11-22 DIAGNOSIS — R0789 Other chest pain: Secondary | ICD-10-CM

## 2022-11-22 DIAGNOSIS — Z48813 Encounter for surgical aftercare following surgery on the respiratory system: Secondary | ICD-10-CM | POA: Diagnosis not present

## 2022-11-22 DIAGNOSIS — K760 Fatty (change of) liver, not elsewhere classified: Secondary | ICD-10-CM

## 2022-11-22 DIAGNOSIS — R053 Chronic cough: Secondary | ICD-10-CM

## 2022-11-22 DIAGNOSIS — I10 Essential (primary) hypertension: Secondary | ICD-10-CM

## 2022-11-22 DIAGNOSIS — Z79899 Other long term (current) drug therapy: Secondary | ICD-10-CM

## 2022-11-22 DIAGNOSIS — E785 Hyperlipidemia, unspecified: Secondary | ICD-10-CM

## 2022-11-22 LAB — CBC WITH DIFFERENTIAL/PLATELET
Basophils Absolute: 0.1 10*3/uL (ref 0.0–0.1)
Basophils Relative: 1.1 % (ref 0.0–3.0)
Eosinophils Absolute: 0.2 10*3/uL (ref 0.0–0.7)
Eosinophils Relative: 2.5 % (ref 0.0–5.0)
HCT: 43.6 % (ref 36.0–46.0)
Hemoglobin: 14.5 g/dL (ref 12.0–15.0)
Lymphocytes Relative: 24.3 % (ref 12.0–46.0)
Lymphs Abs: 2.2 10*3/uL (ref 0.7–4.0)
MCHC: 33.2 g/dL (ref 30.0–36.0)
MCV: 87.4 fL (ref 78.0–100.0)
Monocytes Absolute: 0.7 10*3/uL (ref 0.1–1.0)
Monocytes Relative: 7.5 % (ref 3.0–12.0)
Neutro Abs: 5.7 10*3/uL (ref 1.4–7.7)
Neutrophils Relative %: 64.6 % (ref 43.0–77.0)
Platelets: 282 10*3/uL (ref 150.0–400.0)
RBC: 4.98 Mil/uL (ref 3.87–5.11)
RDW: 12.4 % (ref 11.5–15.5)
WBC: 8.9 10*3/uL (ref 4.0–10.5)

## 2022-11-22 LAB — HEPATIC FUNCTION PANEL
ALT: 16 U/L (ref 0–35)
AST: 18 U/L (ref 0–37)
Albumin: 4.3 g/dL (ref 3.5–5.2)
Alkaline Phosphatase: 46 U/L (ref 39–117)
Bilirubin, Direct: 0.1 mg/dL (ref 0.0–0.3)
Total Bilirubin: 0.5 mg/dL (ref 0.2–1.2)
Total Protein: 7 g/dL (ref 6.0–8.3)

## 2022-11-22 LAB — BASIC METABOLIC PANEL
BUN: 18 mg/dL (ref 6–23)
CO2: 30 meq/L (ref 19–32)
Calcium: 10.3 mg/dL (ref 8.4–10.5)
Chloride: 102 meq/L (ref 96–112)
Creatinine, Ser: 0.89 mg/dL (ref 0.40–1.20)
GFR: 60.52 mL/min (ref >60.00)
Glucose, Bld: 136 mg/dL — ABNORMAL HIGH (ref 70–99)
Potassium: 3.8 meq/L (ref 3.5–5.1)
Sodium: 139 meq/L (ref 135–145)

## 2022-11-22 LAB — LIPID PANEL
Cholesterol: 112 mg/dL (ref 0–200)
HDL: 33.6 mg/dL — ABNORMAL LOW (ref >39.00)
LDL Cholesterol: 47 mg/dL (ref 0–99)
NonHDL: 78.37
Total CHOL/HDL Ratio: 3
Triglycerides: 159 mg/dL — ABNORMAL HIGH (ref 0.0–149.0)
VLDL: 31.8 mg/dL (ref 0.0–40.0)

## 2022-11-22 LAB — TSH: TSH: 1.24 u[IU]/mL (ref 0.35–5.50)

## 2022-11-22 LAB — HEMOGLOBIN A1C: Hgb A1c MFr Bld: 6.3 % (ref 4.6–6.5)

## 2022-11-22 NOTE — Patient Instructions (Addendum)
Good to see you today   Update  labs  Ask the Gi team if and what kind of  gi screening is appropriate for you . Continue lifestyle intervention healthy eating and exercise .  Fu urology as indicated

## 2022-11-27 NOTE — Progress Notes (Signed)
A1c acceptable. Triglyceride  elevated borderline   but LDL is at goal  Continue lifestyle intervention healthy eating and exercise . And medication No med changes advised . Awaiting chest x ray

## 2022-12-04 ENCOUNTER — Other Ambulatory Visit: Payer: Self-pay | Admitting: Internal Medicine

## 2022-12-05 ENCOUNTER — Ambulatory Visit: Payer: Medicare Other

## 2022-12-20 ENCOUNTER — Ambulatory Visit
Admission: RE | Admit: 2022-12-20 | Discharge: 2022-12-20 | Disposition: A | Discharge status: Home / Self Care | Payer: Medicare Other | Source: Ambulatory Visit | Attending: Obstetrics and Gynecology | Admitting: Obstetrics and Gynecology

## 2022-12-20 DIAGNOSIS — Z1231 Encounter for screening mammogram for malignant neoplasm of breast: Secondary | ICD-10-CM

## 2022-12-20 NOTE — Progress Notes (Signed)
Chest X ray read as  no active disease.

## 2023-01-30 ENCOUNTER — Other Ambulatory Visit: Payer: Self-pay | Admitting: Family

## 2023-02-27 ENCOUNTER — Other Ambulatory Visit: Payer: Self-pay | Admitting: Family

## 2023-03-09 ENCOUNTER — Other Ambulatory Visit: Payer: Self-pay | Admitting: Family

## 2023-03-09 ENCOUNTER — Telehealth: Payer: Self-pay

## 2023-03-09 MED ORDER — TRIAMTERENE-HCTZ 37.5-25 MG PO CAPS: 1.0000 | ORAL_CAPSULE | Freq: Every day | ORAL | 2 refills | Status: DC

## 2023-03-09 NOTE — Telephone Encounter (Signed)
Copied from CRM [id number]. Topic: Clinical - Prescription Issue >> Mar 09, 2023 10:00 AM Deaijah H wrote: Reason for CRM: Patient called in to let Dr. Fabian Sharp know she does not go through express scripts for prescription triamterene-hydrochlorothiazide (DYAZIDE) 37.5-25 MG capsule it goes to [facility] [address] [address] / if needed, please call [phone]

## 2023-03-30 ENCOUNTER — Ambulatory Visit: Payer: Self-pay | Admitting: Internal Medicine

## 2023-03-30 ENCOUNTER — Ambulatory Visit
Admission: EM | Admit: 2023-03-30 | Discharge: 2023-03-30 | Disposition: A | Discharge status: Home / Self Care | Attending: Family Medicine | Admitting: Family Medicine

## 2023-03-30 DIAGNOSIS — N3001 Acute cystitis with hematuria: Secondary | ICD-10-CM | POA: Diagnosis not present

## 2023-03-30 LAB — POCT URINALYSIS DIP (MANUAL ENTRY)
Bilirubin, UA: NEGATIVE
Glucose, UA: NEGATIVE mg/dL
Ketones, POC UA: NEGATIVE mg/dL
Nitrite, UA: NEGATIVE
Protein Ur, POC: 30 mg/dL — AB
Spec Grav, UA: 1.025 (ref 1.010–1.025)
Urobilinogen, UA: 1 U/dL
pH, UA: 6 (ref 5.0–8.0)

## 2023-03-30 MED ORDER — CEPHALEXIN 500 MG PO CAPS: 500.0000 mg | ORAL_CAPSULE | Freq: Two times a day (BID) | ORAL | 0 refills | Status: AC

## 2023-03-30 NOTE — ED Triage Notes (Signed)
 Pt states burning with urination and urinary frequency since yesterday.  States she has been taking AZO at home.

## 2023-03-30 NOTE — Discharge Instructions (Addendum)
 The clinic will contact you with results of the urine culture done today if positive.  Start Keflex twice a day for 7 days.  Lots of rest and fluids.  Please follow-up with urologist or your PCP if your symptoms do not improve.  Please go to the ER for any worsening symptoms.  Hope you feel better soon!

## 2023-03-30 NOTE — ED Provider Notes (Signed)
 [facility]    CSN: [id number] Arrival date & time: 03/30/23  1409      History   Chief Complaint Chief Complaint  Patient presents with   Dysuria    HPI Madison Erickson is a 83 y.o. female presents for dysuria.  Patient reports 1 day of urinary burning, urgency, frequency.  Denies any fevers, nausea/vomiting, flank pain.  No vaginal discharge or STD concern.  Has a history of recurrent UTIs.  Has been taking Azo OTC for symptoms.  No other concerns at this time.   Dysuria   Past Medical History:  Diagnosis Date   Abnormal LFTs    fatty liver on [place] MRI faay liver and hemangioma 2008 biopsy 2011   Asthma    allergy   Benign tumor    Bladder polyps    Blood transfusion 1969   Blood transfusion abn reaction or complication, no procedure mishap    Cancer (HCC)    squameous cell lsft leg hx   Colon polyp 2009   Tubulovillous adenoma    Diverticulosis of colon    Eczema    Fatty liver disease, nonalcoholic    confirmed biopsy 2011 mild no fibrosis    Hematuria    urethral polyps   History of migraine    none in years   HT (hammer toe) right   Hyperlipidemia    Hypertension    Kidney disease    nephritis as  child. polpys, hematuria   Lesion of bladder    Lichen sclerosus    gyne care topical steroids   Osteoarthritis    Shortness of breath    partial lower rt lung lobectomy.   Status post partial lobectomy of lung    for bronchiesctesis   Urinary incontinence    UTI (lower urinary tract infection)    Wears glasses     Patient Active Problem List   Diagnosis Date Noted   History of transient ischemic attack (TIA) 05/24/2018   TIA (transient ischemic attack) 10/12/2017   GERD (gastroesophageal reflux disease) 10/12/2017   Migraine 10/12/2017   Fasting hyperglycemia 04/21/2014   Hyperlipidemia 04/21/2014   Pre-diabetes 08/18/2013   Osteopenia 04/24/2013   Medicare annual wellness visit, subsequent 04/15/2013   Lichen sclerosus    Bladder polyps     Mass of Achilles tendon 02/17/2012   Fatty liver disease, nonalcoholic    Knee pain, right 09/06/2010   Allergic rhinitis, seasonal 04/22/2010   Status post partial lobectomy of lung    Asthma 01/27/2010   DIVERTICULITIS-COLON 01/27/2010   History of colonic polyps 01/27/2010   Diverticulosis of colon 10/30/2009   OTHER SPECIFIED DISEASE OF NAIL 03/26/2009   URINARY TRACT INFECTION, RECURRENT 01/04/2009   HYPERGLYCEMIA, FASTING 01/04/2009   LIVER HEMANGIOMA 10/26/2008   FATTY LIVER DISEASE 10/26/2008   NONSPECIFIC ABNORMAL RESULTS LIVR FUNCTION STUDY 11/20/2007   HEMATURIA, MICROSCOPIC, HX OF 11/20/2007   FATIGUE 10/16/2007   HYPERLIPIDEMIA 04/29/2007   Essential hypertension 04/29/2007   HYPERTENSION NEC 04/29/2007   Osteoarthritis 11/19/2006   URINARY INCONTINENCE 11/19/2006    Past Surgical History:  Procedure Laterality Date   APPENDECTOMY  8th grade   BREAST BIOPSY Left    2022   caesarean section     X 3   CYSTOSCOPY WITH BIOPSY N/A 06/11/2020   Procedure: CYSTOSCOPY WITH BLADDER BIOPSY AND FULGURATION;  Surgeon: Bjorn Pippin, MD;  Location: [facility];  Service: Urology;  Laterality: N/A;   KNEE ARTHROSCOPY  2014   left  left knee surgery on torn mansicus  2011   LUMBAR LAMINECTOMY/DECOMPRESSION MICRODISCECTOMY  03/23/2011   Procedure: LUMBAR LAMINECTOMY/DECOMPRESSION MICRODISCECTOMY 1 LEVEL;  Surgeon: Dorian Heckle, MD;  Location: [facility];  Service: Neurosurgery;  Laterality: Left;  Left Lumbar four-five laminectomy and microdiscectomy   PERCUTANEOUS LIVER BIOPSY  2011   fatty liver no fibrosis   rt knee surgery  2012   rt lung lobectomy  1969   for bronchiectasis   VAGINAL HYSTERECTOMY  1974   partial    OB History   No obstetric history on file.      Home Medications    Prior to Admission medications   Medication Sig Start Date End Date Taking? Authorizing Provider  cephALEXin (KEFLEX) 500 MG capsule Take 1 capsule (500 mg  total) by mouth 2 (two) times daily for 7 days. 03/30/23 04/06/23 Yes Radford Pax, NP  amLODipine (NORVASC) 5 MG tablet TAKE 1 TABLET DAILY 10/06/22   Panosh, Neta Mends, MD  clobetasol (TEMOVATE) 0.05 % cream Apply 1 application topically as needed.     [provider]  metoprolol succinate (TOPROL-XL) 50 MG 24 hr tablet TAKE 1 TABLET DAILY WITH OR IMMEDIATELY FOLLOWING A MEAL 10/06/22   Panosh, Neta Mends, MD  METRONIDAZOLE, TOPICAL, 0.75 % LOTN Apply to face once a day as directed 09/29/20   Panosh, Neta Mends, MD  rosuvastatin (CRESTOR) 10 MG tablet TAKE 1 TABLET DAILY 10/06/22   Panosh, Neta Mends, MD  triamterene-hydrochlorothiazide (DYAZIDE) 37.5-25 MG capsule Take 1 each (1 capsule total) by mouth daily. 03/09/23   Eulis Foster, FNP    Family History Family History  Problem Relation Age of Onset   Cancer Mother        lung   Heart disease Mother    Diabetes Paternal Grandfather    Cancer Paternal Grandfather        bladder   Mental retardation Other    Breast cancer Neg Hx     Social History Social History   Tobacco Use   Smoking status: Never    Passive exposure: Never   Smokeless tobacco: Never  Vaping Use   Vaping status: Never Used  Substance Use Topics   Alcohol use: Not Currently    Comment: 4-5 per year    Drug use: No     Allergies   Atorvastatin, Sulfamethoxazole, and Tape   Review of Systems Review of Systems  Genitourinary:  Positive for dysuria.     Physical Exam Triage Vital Signs ED Triage Vitals  Encounter Vitals Group     BP 03/30/23 1500 129/69     Systolic BP Percentile --      Diastolic BP Percentile --      Pulse Rate 03/30/23 1458 69     Resp 03/30/23 1458 16     Temp 03/30/23 1458 98.2 F (36.8 C)     Temp Source 03/30/23 1458 Oral     SpO2 03/30/23 1458 96 %     Weight --      Height --      Head Circumference --      Peak Flow --      Pain Score 03/30/23 1459 0     Pain Loc --      Pain Education --      Exclude from Growth  Chart --    No data found.  Updated Vital Signs BP 129/69 (BP Location: Left Arm)   Pulse 69   Temp 98.2 F (  36.8 C) (Oral)   Resp 16   SpO2 96%   Visual Acuity Right Eye Distance:   Left Eye Distance:   Bilateral Distance:    Right Eye Near:   Left Eye Near:    Bilateral Near:     Physical Exam Vitals and nursing note reviewed.  Constitutional:      Appearance: Normal appearance.  HENT:     Head: Normocephalic and atraumatic.  Eyes:     Pupils: Pupils are equal, round, and reactive to light.  Cardiovascular:     Rate and Rhythm: Normal rate.  Pulmonary:     Effort: Pulmonary effort is normal.  Abdominal:     Tenderness: There is no right CVA tenderness or left CVA tenderness.  Skin:    General: Skin is warm and dry.  Neurological:     General: No focal deficit present.     Mental Status: She is alert and oriented to person, place, and time.  Psychiatric:        Mood and Affect: Mood normal.        Behavior: Behavior normal.      UC Treatments / Results  Labs (all labs ordered are listed, but only abnormal results are displayed) Labs Reviewed  POCT URINALYSIS DIP (MANUAL ENTRY) - Abnormal; Notable for the following components:      Result Value   Clarity, UA turbid (*)    Blood, UA large (*)    Protein Ur, POC =30 (*)    Leukocytes, UA Small (1+) (*)    All other components within normal limits  URINE CULTURE   Basic metabolic panel Order: [id number]  Status: Final result     Next appt: 06/28/2023 at 03:00 PM in Family Medicine Terressa Koyanagi, DO)     Dx: Essential hypertension; Fasting hyper...   Test Result Released: Yes (seen)     Messages: Seen   1 Result Note     1 Patient Communication          Component Ref Range & Units (hover) 4 mo ago (11/22/22) 1 yr ago (09/07/21) 1 yr ago (08/08/21) 1 yr ago (07/06/21) 2 yr ago (11/22/20) 2 yr ago (06/11/20) 3 yr ago (11/21/19)  Sodium 139 138 139 138 138 141 R 138 R  Potassium 3.8 3.6 3.5 3.0  Low  3.6 3.3 Low  R 4.0 R  Chloride 102 100 103 98 100 102 R 100 R  CO2 30 28 29 31 29  27  R  Glucose, Bld 136 High  140 High  137 High  112 High  122 High  140 High  CM 117 High  R, CM  BUN 18 18 13 12 17 15  R 16 R  Creatinine, Ser 0.89 0.71 0.59 0.63 0.67 0.50 R 0.64 R, CM  GFR 60.52 80.05 CM 84.89 CM 83.62 CM 82.74 CM    Comment: Calculated using the CKD-EPI Creatinine Equation (2021)  Calcium 10.3 9.8 9.9 10.8 High  10.5  10.5 High  R  Resulting Agency [place] [facility] [place] [facility] [place] [facility] [place] [facility] [place] [facility]        Specimen Collected: 11/22/22 12:13 Last Resulted: 11/22/22 15:47      EKG   Radiology No results found.  Procedures Procedures (including critical care time)  Medications Ordered in UC Medications - No data to display  Initial Impression / Assessment and Plan / UC Course  I have reviewed the triage vital signs and the nursing notes.  Pertinent labs & imaging results that were available during my care of the patient were reviewed by me and considered in my medical decision making (see chart for details).     Reviewed exam and symptoms with patient.  No red flags.  Urine positive for UTI, will culture and start Keflex.  Encouraged rest and fluids.  PCP follow-up if symptoms do not improve.  ER precautions reviewed and patient verbalized understanding. Final Clinical Impressions(s) / UC Diagnoses   Final diagnoses:  Acute cystitis with hematuria     Discharge Instructions      The clinic will contact you with results of the urine culture done today if positive.  Start Keflex twice a day for 7 days.  Lots of rest and fluids.  Please follow-up with urologist or your PCP if your symptoms do not improve.  Please go to the ER for any worsening symptoms.  Hope you feel better soon!    ED Prescriptions     Medication Sig Dispense Auth. Provider   cephALEXin (KEFLEX) 500 MG capsule Take 1 capsule (500 mg total) by  mouth 2 (two) times daily for 7 days. 14 capsule Radford Pax, NP      PDMP not reviewed this encounter.   Radford Pax, NP 03/30/23 [phone]

## 2023-03-30 NOTE — Telephone Encounter (Signed)
  Chief Complaint: burning with urination Symptoms: increased frequency and burning with urination Frequency: started about a week ago, started azo, that helped, but now s/s have returned.  Pertinent Negatives: Patient denies fever, back pain, abd pain,  Disposition: [] ED /[x] Urgent Care (no appt availability in office) / [] Appointment(In office/virtual)/ []  [place] [place] [facility]/ [] Home Care/ [] Refused Recommended Disposition /[] [place] [facility]/ []  Follow-up with PCP Additional Notes: Pt states that she started with burning and increased frequency with urination about a week ago. Pt started using azo at that time and s/s resolved. Pt now is having the burning and increased urination again. No appt avail in pcp office today, pt advised to utilize UC, pt agreeable. Pt has hx of bladder/urinary issues, states this feels like her prior UTIs.   Copied from CRM [id number]. Topic: Clinical - Red Word Triage >> Mar 30, 2023  1:20 PM [place] wrote: Red Word that prompted transfer to Nurse Triage: Patient has a UTI, started a few days ago. She has been taking AZO and it is not helping. Reason for Disposition  Urinating more frequently than usual (i.e., frequency)  Answer Assessment - Initial Assessment Questions 1. SYMPTOM: "What's the main symptom you're concerned about?" (e.g., frequency, incontinence)     Burning, increased frequency 2. ONSET: "When did the  burning  start?"     Started back a couple hours ago ,started initially a week ago, and took azo and that helped for a bit 3. PAIN: "Is there any pain?" If Yes, ask: "How bad is it?" (Scale: 1-10; mild, moderate, severe)     denies 4. CAUSE: "What do you think is causing the symptoms?"     UTI 5. OTHER SYMPTOMS: "Do you have any other symptoms?" (e.g., blood in urine, fever, flank pain, pain with urination)     denies  Protocols used: Urinary Symptoms-A-AH

## 2023-04-01 LAB — URINE CULTURE

## 2023-04-08 ENCOUNTER — Other Ambulatory Visit: Payer: Self-pay | Admitting: Family

## 2023-04-18 ENCOUNTER — Ambulatory Visit (INDEPENDENT_AMBULATORY_CARE_PROVIDER_SITE_OTHER): Admitting: Internal Medicine

## 2023-04-18 ENCOUNTER — Encounter: Payer: Self-pay | Admitting: Internal Medicine

## 2023-04-18 VITALS — BP 110/62 | HR 67 | Temp 98.7°F | Wt 147.4 lb

## 2023-04-18 DIAGNOSIS — Z87440 Personal history of urinary (tract) infections: Secondary | ICD-10-CM

## 2023-04-18 DIAGNOSIS — L900 Lichen sclerosus et atrophicus: Secondary | ICD-10-CM

## 2023-04-18 DIAGNOSIS — R300 Dysuria: Secondary | ICD-10-CM

## 2023-04-18 LAB — POC URINALSYSI DIPSTICK (AUTOMATED)
Bilirubin, UA: NEGATIVE
Glucose, UA: NEGATIVE
Ketones, UA: NEGATIVE
Leukocytes, UA: NEGATIVE
Nitrite, UA: NEGATIVE
Protein, UA: POSITIVE — AB
Spec Grav, UA: 1.01 (ref 1.010–1.025)
Urobilinogen, UA: 0.2 U/dL
pH, UA: 6.5 (ref 5.0–8.0)

## 2023-04-18 NOTE — Patient Instructions (Addendum)
 Good to  see  you today   Sending out for culture just in case .  If symptoms geet worse  contact the team .   If recurrent may need  input from urology about other prevention.

## 2023-04-18 NOTE — Progress Notes (Signed)
 Chief Complaint  Patient presents with   Dysuria    Fu ed     HPI: Madison Erickson 83 y.o. come in for acute hx of uti recurrent  and now Was seen 3 7 for acute cystisti   given keflex and told to fu uro or pcp U cx shoed multp species  had pos blood and small leuk  antibiotic helped  .   Now yesterday  Burning off and on .  Iust wants to make sure not uti  To see dr Senaida Ores  lichen sclerosis   and using steroid  irritation.  Could cause some sx   Has long standing  micro blood in urine but had no uti for a year or so .   ROS: See pertinent positives and negatives per HPI. No fever chills    Past Medical History:  Diagnosis Date   Abnormal LFTs    fatty liver on [place] MRI faay liver and hemangioma 2008 biopsy 2011   Asthma    allergy   Benign tumor    Bladder polyps    Blood transfusion 1969   Blood transfusion abn reaction or complication, no procedure mishap    Cancer (HCC)    squameous cell lsft leg hx   Colon polyp 2009   Tubulovillous adenoma    Diverticulosis of colon    Eczema    Fatty liver disease, nonalcoholic    confirmed biopsy 2011 mild no fibrosis    Hematuria    urethral polyps   History of migraine    none in years   HT (hammer toe) right   Hyperlipidemia    Hypertension    Kidney disease    nephritis as  child. polpys, hematuria   Lesion of bladder    Lichen sclerosus    gyne care topical steroids   Osteoarthritis    Shortness of breath    partial lower rt lung lobectomy.   Status post partial lobectomy of lung    for bronchiesctesis   Urinary incontinence    UTI (lower urinary tract infection)    Wears glasses     Family History  Problem Relation Age of Onset   Cancer Mother        lung   Heart disease Mother    Diabetes Paternal Grandfather    Cancer Paternal Grandfather        bladder   Mental retardation Other    Breast cancer Neg Hx     Social History   Socioeconomic History   Marital status: Married    Spouse name:  Not on file   Number of children: Not on file   Years of education: Not on file   Highest education level: Not on file  Occupational History   Not on file  Tobacco Use   Smoking status: Never    Passive exposure: Never   Smokeless tobacco: Never  Vaping Use   Vaping status: Never Used  Substance and Sexual Activity   Alcohol use: Not Currently    Comment: 4-5 per year    Drug use: No   Sexual activity: Not on file    Comment: hysterectomy  Other Topics Concern   Not on file  Social History Narrative   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   Married   Alcohol occasional social   Household of 2   Caffeine - coffee in am 3 cups   Bereaved parent   Single story home  Bachelors degree    Social Drivers of Corporate investment banker Strain: Low Risk  (11/22/2022)   Overall Financial Resource Strain (CARDIA)    Difficulty of Paying Living Expenses: Not hard at all  Food Insecurity: No Food Insecurity (03/15/2021)   Hunger Vital Sign    Worried About Running Out of Food in the Last Year: Never true    Ran Out of Food in the Last Year: Never true  Transportation Needs: No Transportation Needs (03/15/2021)   PRAPARE - Administrator, Civil Service (Medical): No    Lack of Transportation (Non-Medical): No  Physical Activity: Inactive (11/22/2022)   Exercise Vital Sign    Days of Exercise per Week: 0 days    Minutes of Exercise per Session: 0 min  Stress: No Stress Concern Present (11/22/2022)   [employer] of Occupational Health - Occupational Stress Questionnaire    Feeling of Stress : Not at all  Social Connections: Socially Integrated (11/22/2022)   Social Connection and Isolation Panel [NHANES]    Frequency of Communication with Friends and Family: Three times a week    Frequency of Social Gatherings with Friends and Family: Once a week    Attends Religious Services: More than 4 times per year    Active Member of [employer] or Organizations: Yes     Attends Engineer, structural: More than 4 times per year    Marital Status: Married    Outpatient Medications Prior to Visit  Medication Sig Dispense Refill   amLODipine (NORVASC) 5 MG tablet TAKE 1 TABLET DAILY 90 tablet 3   clobetasol (TEMOVATE) 0.05 % cream Apply 1 application topically as needed.      metoprolol succinate (TOPROL-XL) 50 MG 24 hr tablet TAKE 1 TABLET DAILY WITH OR IMMEDIATELY FOLLOWING A MEAL 90 tablet 3   METRONIDAZOLE, TOPICAL, 0.75 % LOTN Apply to face once a day as directed 59 mL 2   rosuvastatin (CRESTOR) 10 MG tablet TAKE 1 TABLET DAILY 90 tablet 3   triamterene-hydrochlorothiazide (DYAZIDE) 37.5-25 MG capsule Take 1 each (1 capsule total) by mouth daily. 30 capsule 2   No facility-administered medications prior to visit.     EXAM:  BP 110/62   Pulse 67   Temp 98.7 F (37.1 C) (Oral)   Wt 147 lb 6.4 oz (66.9 kg)   SpO2 98%   BMI 26.28 kg/m   Body mass index is 26.28 kg/m.  GENERAL: vitals reviewed and listed above, alert, oriented, appears well hydrated and in no acute distress HEENT: atraumatic, conjunctiva  clear, no obvious abnormalities on inspection of external nose and ears OP : no lesion edema or exudate  NECK: no obvious masses on inspection palpation  LUNGS: clear to auscultation bilaterally, no wheezes, rales or rhonchi, good air movement CV: HRRR, no clubbing cyanosis or  peripheral edema nl cap refill  MS: moves all extremities without noticeable focal  abnormality PSYCH: pleasant and cooperative, no obvious depression or anxiety Lab Results  Component Value Date   WBC 8.9 11/22/2022   HGB 14.5 11/22/2022   HCT 43.6 11/22/2022   PLT 282.0 11/22/2022   GLUCOSE 136 (H) 11/22/2022   CHOL 112 11/22/2022   TRIG 159.0 (H) 11/22/2022   HDL 33.60 (L) 11/22/2022   LDLDIRECT 201.9 10/26/2008   LDLCALC 47 11/22/2022   ALT 16 11/22/2022   AST 18 11/22/2022   NA 139 11/22/2022   K 3.8 11/22/2022   CL 102 11/22/2022   CREATININE  0.89  11/22/2022   BUN 18 11/22/2022   CO2 30 11/22/2022   TSH 1.24 11/22/2022   INR 1.02 10/12/2017   HGBA1C 6.3 11/22/2022   BP Readings from Last 3 Encounters:  04/18/23 110/62  03/30/23 129/69  11/22/22 128/80   Lab Results  Component Value Date   COLORU YELLOW 04/18/2023   CLARITYU CLEAR 04/18/2023   GLUCOSEUR Negative 04/18/2023   BILIRUBINUR NEG 04/18/2023   KETONESU NEG 04/18/2023   SPECGRAV 1.010 04/18/2023   RBCUR 1+ 04/18/2023   PHUR 6.5 04/18/2023   PROTEINUR Positive (A) 04/18/2023   UROBILINOGEN 0.2 04/18/2023   LEUKOCYTESUR Negative 04/18/2023    ASSESSMENT AND PLAN:  Discussed the following assessment and plan:  Dysuria - Plan: Urine Culture, Urine Culture  History of recurrent UTIs - Plan: POCT Urinalysis Dipstick (Automated)  Lichen sclerosus Yes get  rsv vaccine when possible . Ask gyne other is topical estrogen might be helpful .  Will do ur cx and then  rx as appropriate .    -Patient advised to return or notify health care team  if  new concerns arise.  Patient Instructions  Good to  see  you today   Sending out for culture just in case .  If symptoms geet worse  contact the team .   If recurrent may need  input from urology about other prevention.     Neta Mends. Shaquasia Caponigro M.D.

## 2023-04-20 LAB — URINE CULTURE
MICRO NUMBER:: 16251692
SPECIMEN QUALITY:: ADEQUATE

## 2023-04-23 ENCOUNTER — Encounter: Payer: Self-pay | Admitting: Internal Medicine

## 2023-04-23 NOTE — Progress Notes (Signed)
 So  bacteria still in urine   So  I would   treat with another antibiotic  If you tolerate  or not allergic  to macrobid    please send in macrobid 100 mg 1 po bid for 7 days disp 14 days

## 2023-04-24 ENCOUNTER — Other Ambulatory Visit: Payer: Self-pay

## 2023-04-24 MED ORDER — NITROFURANTOIN MONOHYD MACRO 100 MG PO CAPS: 100.0000 mg | ORAL_CAPSULE | Freq: Two times a day (BID) | ORAL | 0 refills | Status: AC

## 2023-05-03 ENCOUNTER — Encounter: Payer: Self-pay | Admitting: Internal Medicine

## 2023-05-03 DIAGNOSIS — Z01419 Encounter for gynecological examination (general) (routine) without abnormal findings: Secondary | ICD-10-CM | POA: Diagnosis not present

## 2023-05-03 DIAGNOSIS — L900 Lichen sclerosus et atrophicus: Secondary | ICD-10-CM | POA: Diagnosis not present

## 2023-05-03 NOTE — Progress Notes (Unsigned)
 [employer]

## 2023-06-27 ENCOUNTER — Other Ambulatory Visit: Payer: Self-pay | Admitting: Family

## 2023-06-28 ENCOUNTER — Ambulatory Visit: Admitting: Family Medicine

## 2023-07-05 ENCOUNTER — Other Ambulatory Visit: Payer: Self-pay | Admitting: Internal Medicine

## 2023-07-05 NOTE — Telephone Encounter (Signed)
 Copied from CRM [id number]. Topic: Clinical - Medication Refill >> Jul 05, 2023  3:14 PM Carlatta H wrote: Medication: triamterene -hydrochlorothiazide  (DYAZIDE) 37.5-25 MG capsule [Pharmacy Med Name: TRIAMTERENE  37.5MG / HCTZ 25MG  CAPS] [[id number]]  Has the patient contacted their pharmacy? No (Agent: If no, request that the patient contact the pharmacy for the refill. If patient does not wish to contact the pharmacy document the reason why and proceed with request.) (Agent: If yes, when and what did the pharmacy advise?)  This is the patient's preferred pharmacy:    [id number] - [place], [place] - [address] AT [address] [address] [place] [address] Phone: [phone] Fax: [phone]    Is this the correct pharmacy for this prescription? Yes If no, delete pharmacy and type the correct one.   Has the prescription been filled recently? No  Is the patient out of the medication? No  Has the patient been seen for an appointment in the last year OR does the patient have an upcoming appointment? Yes  Can we respond through MyChart? No  Agent: Please be advised that Rx refills may take up to 3 business days. We ask that you follow-up with your pharmacy.

## 2023-07-06 MED ORDER — TRIAMTERENE-HCTZ 37.5-25 MG PO CAPS: 1.0000 | ORAL_CAPSULE | Freq: Every day | ORAL | 2 refills | Status: DC

## 2023-07-10 DIAGNOSIS — Z85828 Personal history of other malignant neoplasm of skin: Secondary | ICD-10-CM | POA: Diagnosis not present

## 2023-07-10 DIAGNOSIS — L308 Other specified dermatitis: Secondary | ICD-10-CM | POA: Diagnosis not present

## 2023-09-26 DIAGNOSIS — Z85828 Personal history of other malignant neoplasm of skin: Secondary | ICD-10-CM | POA: Diagnosis not present

## 2023-09-26 DIAGNOSIS — L821 Other seborrheic keratosis: Secondary | ICD-10-CM | POA: Diagnosis not present

## 2023-09-26 DIAGNOSIS — L570 Actinic keratosis: Secondary | ICD-10-CM | POA: Diagnosis not present

## 2023-09-26 DIAGNOSIS — L218 Other seborrheic dermatitis: Secondary | ICD-10-CM | POA: Diagnosis not present

## 2023-10-11 ENCOUNTER — Other Ambulatory Visit: Payer: Self-pay | Admitting: Internal Medicine

## 2023-10-17 ENCOUNTER — Encounter: Payer: Self-pay | Admitting: Internal Medicine

## 2024-01-04 ENCOUNTER — Other Ambulatory Visit: Payer: Self-pay | Admitting: Internal Medicine

## 2024-01-14 ENCOUNTER — Other Ambulatory Visit: Payer: Self-pay | Admitting: Internal Medicine

## 2024-01-16 ENCOUNTER — Telehealth: Payer: Self-pay | Admitting: *Deleted

## 2024-01-16 ENCOUNTER — Other Ambulatory Visit: Payer: Self-pay | Admitting: Internal Medicine

## 2024-01-16 ENCOUNTER — Other Ambulatory Visit: Payer: Self-pay

## 2024-01-16 MED ORDER — ROSUVASTATIN CALCIUM 10 MG PO TABS: 10.0000 mg | ORAL_TABLET | Freq: Every day | ORAL | 0 refills | Status: DC

## 2024-01-16 MED ORDER — AMLODIPINE BESYLATE 5 MG PO TABS: 5.0000 mg | ORAL_TABLET | Freq: Every day | ORAL | 0 refills | Status: DC

## 2024-01-16 MED ORDER — METOPROLOL SUCCINATE ER 50 MG PO TB24: ORAL_TABLET | ORAL | 0 refills | Status: DC

## 2024-01-16 NOTE — Telephone Encounter (Signed)
 Copied from CRM #[id number]. Topic: Clinical - Medication Question >> Jan 16, 2024 11:13 AM Madison Erickson wrote: Reason for CRM: Patient would like to know if her medication can be bridge until her upcoming appointment since she wasn't made aware shew would need an appointment before refills are placed when she initial made request back on 01/04/24. Patient only has 3 days left of   amLODipine  (NORVASC ) 5 MG tablet  metoprolol  succinate (TOPROL -XL) 50 MG 24 hr tablet  rosuvastatin  (CRESTOR ) 10 MG tablet  Patient would like prescriptions sent to [id number] - [place],  - [address] AT [address] OF [address]

## 2024-01-16 NOTE — Telephone Encounter (Signed)
 Reach out to pt. Pt reports she has schedule with Dr. Charlett on 01/29/2023 and will discuss with Dr. Charlett at appt about her refused refill. Sent 30 days of request Rx to [facility].  No further action is needed right now.

## 2024-01-16 NOTE — Telephone Encounter (Signed)
 Copied from CRM #[id number]. Topic: General - Other >> Jan 16, 2024 10:47 AM Alfonso HERO wrote: Reason for CRM: patient calling in regards to refills not being filled. I informed her that they were refused and tried to make her an appt but she wants me to send a message to advise that she just needs her medicine because she only has 3 pills left. She is asking for a nurse to call her back.

## 2024-01-16 NOTE — Telephone Encounter (Signed)
 Spoke to pt and Rx sent. Please see other encounter.

## 2024-01-29 ENCOUNTER — Encounter: Payer: Self-pay | Admitting: Internal Medicine

## 2024-01-29 ENCOUNTER — Ambulatory Visit (INDEPENDENT_AMBULATORY_CARE_PROVIDER_SITE_OTHER): Admitting: Internal Medicine

## 2024-01-29 VITALS — BP 124/62 | HR 67 | Temp 98.5°F | Ht 62.8 in | Wt 142.0 lb

## 2024-01-29 DIAGNOSIS — Z79899 Other long term (current) drug therapy: Secondary | ICD-10-CM

## 2024-01-29 DIAGNOSIS — I10 Essential (primary) hypertension: Secondary | ICD-10-CM | POA: Diagnosis not present

## 2024-01-29 DIAGNOSIS — Z8709 Personal history of other diseases of the respiratory system: Secondary | ICD-10-CM | POA: Diagnosis not present

## 2024-01-29 DIAGNOSIS — R7301 Impaired fasting glucose: Secondary | ICD-10-CM | POA: Diagnosis not present

## 2024-01-29 DIAGNOSIS — Z902 Acquired absence of lung [part of]: Secondary | ICD-10-CM | POA: Diagnosis not present

## 2024-01-29 DIAGNOSIS — K760 Fatty (change of) liver, not elsewhere classified: Secondary | ICD-10-CM | POA: Diagnosis not present

## 2024-01-29 DIAGNOSIS — R053 Chronic cough: Secondary | ICD-10-CM

## 2024-01-29 DIAGNOSIS — E785 Hyperlipidemia, unspecified: Secondary | ICD-10-CM

## 2024-01-29 MED ORDER — ROSUVASTATIN CALCIUM 10 MG PO TABS: 10.0000 mg | ORAL_TABLET | Freq: Every day | ORAL | 3 refills | Status: AC

## 2024-01-29 MED ORDER — METOPROLOL SUCCINATE ER 50 MG PO TB24: ORAL_TABLET | ORAL | 3 refills | Status: AC

## 2024-01-29 MED ORDER — AMLODIPINE BESYLATE 5 MG PO TABS: 5.0000 mg | ORAL_TABLET | Freq: Every day | ORAL | 3 refills | Status: AC

## 2024-01-29 NOTE — Patient Instructions (Addendum)
" °  Will get  pulmonary referral for the on going cough  could be upper  but since had lung surgery and bronchiecteisi     will  get evaluation  .  Could be  silent reflux  Lab today   Refill meds .   BP is good today   refill meds .   "

## 2024-01-29 NOTE — Progress Notes (Signed)
 "  Chief Complaint  Patient presents with   Medical Management of Chronic Issues    Pt is here for med check. Would like to check blood. Only had a slice of bread about an hour ago.    Cough    Would like a chest x-ray. Due to partial lung.     HPI: Madison Erickson 84 y.o. come in for Chronic disease management  yearly check  Cough   off and on ? From pnd  usually clear  mucous   . But not sure  not at  asks about a nother c xray just in case since haws had lobectomy and hx of ? Bronchiecteisi?  This seems more like upper drainage  but not sure   HT meds  doing well  no concerns  had a hard time getting refills in our system  GU: last urology check all ok and recheck prn   Trying melatonin  and mag feet  for sleep irreg .  ROS: See pertinent positives and negatives per HPI. No cv sx   Past Medical History:  Diagnosis Date   Abnormal LFTs    fatty liver on US  MRI faay liver and hemangioma 2008 biopsy 2011   Asthma    allergy   Benign tumor    Bladder polyps    Blood transfusion 1969   Blood transfusion abn reaction or complication, no procedure mishap    Cancer (HCC)    squameous cell lsft leg hx   Colon polyp 2009   Tubulovillous adenoma    Diverticulosis of colon    Eczema    Fatty liver disease, nonalcoholic    confirmed biopsy 2011 mild no fibrosis    Hematuria    urethral polyps   History of migraine    none in years   HT (hammer toe) right   Hyperlipidemia    Hypertension    Kidney disease    nephritis as  child. polpys, hematuria   Lesion of bladder    Lichen sclerosus    gyne care topical steroids   Osteoarthritis    Shortness of breath    partial lower rt lung lobectomy.   Status post partial lobectomy of lung    for bronchiesctesis   Urinary incontinence    UTI (lower urinary tract infection)    Wears glasses     Family History  Problem Relation Age of Onset   Cancer Mother        lung   Heart disease Mother    Diabetes Paternal Grandfather     Cancer Paternal Grandfather        bladder   Mental retardation Other    Breast cancer Neg Hx     Social History   Socioeconomic History   Marital status: Married    Spouse name: Not on file   Number of children: Not on file   Years of education: Not on file   Highest education level: Not on file  Occupational History   Not on file  Tobacco Use   Smoking status: Never    Passive exposure: Never   Smokeless tobacco: Never  Vaping Use   Vaping status: Never Used  Substance and Sexual Activity   Alcohol use: Not Currently    Comment: 4-5 per year    Drug use: No   Sexual activity: Not on file    Comment: hysterectomy  Other Topics Concern   Not on file  Social History Narrative   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   Married   Alcohol occasional social   Household of 2   Caffeine - coffee in am 3 cups   Bereaved parent   Single story home   Bachelors degree    Social Drivers of Health   Tobacco Use: Low Risk (01/29/2024)   Patient History    Smoking Tobacco Use: Never    Smokeless Tobacco Use: Never    Passive Exposure: Never  Financial Resource Strain: Low Risk (11/22/2022)   Overall Financial Resource Strain (CARDIA)    Difficulty of Paying Living Expenses: Not hard at all  Food Insecurity: No Food Insecurity (03/15/2021)   Hunger Vital Sign    Worried About Running Out of Food in the Last Year: Never true    Ran Out of Food in the Last Year: Never true  Transportation Needs: No Transportation Needs (03/15/2021)   PRAPARE - Administrator, Civil Service (Medical): No    Lack of Transportation (Non-Medical): No  Physical Activity: Inactive (11/22/2022)   Exercise Vital Sign    Days of Exercise per Week: 0 days    Minutes of Exercise per Session: 0 min  Stress: No Stress Concern Present (11/22/2022)   [employer] of Occupational Health - Occupational Stress Questionnaire    Feeling of Stress : Not at all  Social Connections:  Socially Integrated (11/22/2022)   Social Connection and Isolation Panel    Frequency of Communication with Friends and Family: Three times a week    Frequency of Social Gatherings with Friends and Family: Once a week    Attends Religious Services: More than 4 times per year    Active Member of Clubs or Organizations: Yes    Attends Banker Meetings: More than 4 times per year    Marital Status: Married  Depression (PHQ2-9): Low Risk (01/29/2024)   Depression (PHQ2-9)    PHQ-2 Score: 0  Alcohol Screen: Low Risk (11/22/2022)   Alcohol Screen    Last Alcohol Screening Score (AUDIT): 1  Housing: Not on file  Utilities: Not on file  Health Literacy: Not on file    Outpatient Medications Prior to Visit  Medication Sig Dispense Refill   clobetasol  (TEMOVATE ) 0.05 % cream Apply 1 application topically as needed.      METRONIDAZOLE , TOPICAL, 0.75 % LOTN Apply to face once a day as directed 59 mL 2   triamterene -hydrochlorothiazide  (DYAZIDE) 37.5-25 MG capsule TAKE 1 CAPSULE BY MOUTH ONCE DAILY 90 capsule 1   nitrofurantoin , macrocrystal-monohydrate, (MACROBID ) 100 MG capsule Take 1 capsule (100 mg total) by mouth 2 (two) times daily. (Patient not taking: Reported on 01/29/2024) 14 capsule 0   amLODipine  (NORVASC ) 5 MG tablet Take 1 tablet (5 mg total) by mouth daily. 30 tablet 0   metoprolol  succinate (TOPROL -XL) 50 MG 24 hr tablet TAKE 1 TABLET DAILY WITH OR IMMEDIATELY FOLLOWING A MEAL 30 tablet 0   rosuvastatin  (CRESTOR ) 10 MG tablet Take 1 tablet (10 mg total) by mouth daily. 30 tablet 0   No facility-administered medications prior to visit.     EXAM:  BP 124/62 (BP Location: Left Arm, Patient Position: Sitting, Cuff Size: Normal)   Pulse 67   Temp 98.5 F (36.9 C) (Oral)   Ht 5' 2.8 (1.595 m)   Wt 142 lb (64.4 kg)   SpO2 97%   BMI 25.31 kg/m   Body mass index is 25.31 kg/m.  GENERAL: vitals reviewed and listed above, alert, oriented, appears  well hydrated and in  no acute distress HEENT: atraumatic, conjunctiva  clear, no obvious abnormalities on inspection of external nose and ears OP : no lesion edema or exudate  NECK: no obvious masses on inspection palpation  LUNGS: clear to auscultation bilaterally, no wheezes, rales or rhonchi,  CV: HRRR, no clubbing cyanosis or  peripheral edema nl cap refill  MS: moves all extremities without noticeable focal  abnormality PSYCH: pleasant and cooperative, no obvious depression or anxiety Lab Results  Component Value Date   WBC 8.9 11/22/2022   HGB 14.5 11/22/2022   HCT 43.6 11/22/2022   PLT 282.0 11/22/2022   GLUCOSE 136 (H) 11/22/2022   CHOL 112 11/22/2022   TRIG 159.0 (H) 11/22/2022   HDL 33.60 (L) 11/22/2022   LDLDIRECT 201.9 10/26/2008   LDLCALC 47 11/22/2022   ALT 16 11/22/2022   AST 18 11/22/2022   NA 139 11/22/2022   K 3.8 11/22/2022   CL 102 11/22/2022   CREATININE 0.89 11/22/2022   BUN 18 11/22/2022   CO2 30 11/22/2022   TSH 1.24 11/22/2022   INR 1.02 10/12/2017   HGBA1C 6.3 11/22/2022   BP Readings from Last 3 Encounters:  01/29/24 124/62  04/18/23 110/62  03/30/23 129/69   Half toast and water    this am  ASSESSMENT AND PLAN:  Discussed the following assessment and plan:  Cough, persistent - Plan: Basic metabolic panel with GFR, CBC with Differential/Platelet, Hemoglobin A1c, Hepatic function panel, Lipid panel, TSH, Magnesium , Ambulatory referral to Pulmonology  Essential hypertension - Plan: Basic metabolic panel with GFR, CBC with Differential/Platelet, Hemoglobin A1c, Hepatic function panel, Lipid panel, TSH, Magnesium , Microalbumin / creatinine urine ratio  Hyperlipidemia, unspecified hyperlipidemia type - Plan: Basic metabolic panel with GFR, CBC with Differential/Platelet, Hemoglobin A1c, Hepatic function panel, Lipid panel, TSH, Magnesium   Medication management - Plan: Basic metabolic panel with GFR, CBC with Differential/Platelet, Hemoglobin A1c, Hepatic function panel,  Lipid panel, TSH, Magnesium , Microalbumin / creatinine urine ratio  Fasting hyperglycemia - Plan: Basic metabolic panel with GFR, CBC with Differential/Platelet, Hemoglobin A1c, Hepatic function panel, Lipid panel, TSH, Magnesium , Microalbumin / creatinine urine ratio  Fatty liver disease, nonalcoholic - Plan: Basic metabolic panel with GFR, CBC with Differential/Platelet, Hemoglobin A1c, Hepatic function panel, Lipid panel, TSH, Magnesium   History of bronchiectasis - Plan: Ambulatory referral to Pulmonology  History of lobectomy of lung - Plan: Ambulatory referral to Pulmonology Update lab monitoring  for all of above  seems stable  x cough  Options discuss about  fu cough  Will do pulmonary referral  consider  ct imaging cause of  past hx but may be  a benign cause .   Ht controlled and refill meds today  Refill statin today and lab  -Patient advised to return or notify health care team  if  new concerns arise.  Patient Instructions   Will get  pulmonary referral for the on going cough  could be upper  but since had lung surgery and bronchiecteisi     will  get evaluation  .  Could be  silent reflux  Lab today   Refill meds .   BP is good today   refill meds .    Bunnie Rehberg K. Erman Thum M.D. "

## 2024-01-30 LAB — MICROALBUMIN / CREATININE URINE RATIO
Creatinine,U: 98.1 mg/dL
Microalb Creat Ratio: 38.9 mg/g — ABNORMAL HIGH (ref 0.0–30.0)
Microalb, Ur: 3.8 mg/dL — ABNORMAL HIGH (ref 0.7–1.9)

## 2024-01-30 LAB — BASIC METABOLIC PANEL WITH GFR
BUN: 16 mg/dL (ref 6–23)
CO2: 30 meq/L (ref 19–32)
Calcium: 10.5 mg/dL (ref 8.4–10.5)
Chloride: 100 meq/L (ref 96–112)
Creatinine, Ser: 0.85 mg/dL (ref 0.40–1.20)
GFR: 63.42 mL/min
Glucose, Bld: 113 mg/dL — ABNORMAL HIGH (ref 70–99)
Potassium: 3.4 meq/L — ABNORMAL LOW (ref 3.5–5.1)
Sodium: 137 meq/L (ref 135–145)

## 2024-01-30 LAB — CBC WITH DIFFERENTIAL/PLATELET
Basophils Absolute: 0.1 K/uL (ref 0.0–0.1)
Basophils Relative: 1.2 % (ref 0.0–3.0)
Eosinophils Absolute: 0.1 K/uL (ref 0.0–0.7)
Eosinophils Relative: 1.5 % (ref 0.0–5.0)
HCT: 41.5 % (ref 36.0–46.0)
Hemoglobin: 14.4 g/dL (ref 12.0–15.0)
Lymphocytes Relative: 23.7 % (ref 12.0–46.0)
Lymphs Abs: 2.3 K/uL (ref 0.7–4.0)
MCHC: 34.7 g/dL (ref 30.0–36.0)
MCV: 85 fl (ref 78.0–100.0)
Monocytes Absolute: 0.7 K/uL (ref 0.1–1.0)
Monocytes Relative: 7.4 % (ref 3.0–12.0)
Neutro Abs: 6.4 K/uL (ref 1.4–7.7)
Neutrophils Relative %: 66.2 % (ref 43.0–77.0)
Platelets: 292 K/uL (ref 150.0–400.0)
RBC: 4.88 Mil/uL (ref 3.87–5.11)
RDW: 12.3 % (ref 11.5–15.5)
WBC: 9.6 K/uL (ref 4.0–10.5)

## 2024-01-30 LAB — HEPATIC FUNCTION PANEL
ALT: 14 U/L (ref 3–35)
AST: 20 U/L (ref 5–37)
Albumin: 4.7 g/dL (ref 3.5–5.2)
Alkaline Phosphatase: 42 U/L (ref 39–117)
Bilirubin, Direct: 0 mg/dL — ABNORMAL LOW (ref 0.1–0.3)
Total Bilirubin: 0.5 mg/dL (ref 0.2–1.2)
Total Protein: 7.2 g/dL (ref 6.0–8.3)

## 2024-01-30 LAB — LIPID PANEL
Cholesterol: 105 mg/dL (ref 28–200)
HDL: 38.4 mg/dL — ABNORMAL LOW
LDL Cholesterol: 42 mg/dL (ref 10–99)
NonHDL: 66.11
Total CHOL/HDL Ratio: 3
Triglycerides: 120 mg/dL (ref 10.0–149.0)
VLDL: 24 mg/dL (ref 0.0–40.0)

## 2024-01-30 LAB — TSH: TSH: 1.05 u[IU]/mL (ref 0.35–5.50)

## 2024-01-30 LAB — HEMOGLOBIN A1C: Hgb A1c MFr Bld: 6.4 % (ref 4.6–6.5)

## 2024-01-30 LAB — MAGNESIUM: Magnesium: 2.4 mg/dL (ref 1.5–2.5)

## 2024-01-31 ENCOUNTER — Other Ambulatory Visit: Payer: Self-pay | Admitting: Obstetrics and Gynecology

## 2024-01-31 DIAGNOSIS — Z1231 Encounter for screening mammogram for malignant neoplasm of breast: Secondary | ICD-10-CM

## 2024-02-04 ENCOUNTER — Ambulatory Visit: Payer: Self-pay | Admitting: Internal Medicine

## 2024-02-04 DIAGNOSIS — I10 Essential (primary) hypertension: Secondary | ICD-10-CM

## 2024-02-04 DIAGNOSIS — Z79899 Other long term (current) drug therapy: Secondary | ICD-10-CM

## 2024-02-04 NOTE — Progress Notes (Signed)
 Ldl at goal  no diabetes  but pre diabetic  A1c Potassium slightly low again  ( mostl likely from  diuretic med)  but magnesium  normal    Slight borderline inc protein in urine  ( may not be significant  will follow )  Increase potassium  in diet   ( check potassium rich foods on line,  alternatively would take a supplement ) None of these findings ar alarming . Check bmp and urin microalbumin ratio  in 1 month or so

## 2024-02-05 ENCOUNTER — Inpatient Hospital Stay
Admission: RE | Admit: 2024-02-05 | Discharge: 2024-02-05 | Attending: Obstetrics and Gynecology | Admitting: Obstetrics and Gynecology

## 2024-02-05 DIAGNOSIS — Z1231 Encounter for screening mammogram for malignant neoplasm of breast: Secondary | ICD-10-CM

## 2024-02-18 ENCOUNTER — Ambulatory Visit

## 2024-02-27 ENCOUNTER — Ambulatory Visit

## 2024-03-10 ENCOUNTER — Other Ambulatory Visit

## 2024-03-24 ENCOUNTER — Ambulatory Visit
